# Patient Record
Sex: Female | Born: 1980
Health system: Southern US, Community
[De-identification: ages and names within clinical notes are randomized; demographics above are authoritative.]

## PROBLEM LIST (undated history)

## (undated) DIAGNOSIS — O24119 Pre-existing diabetes mellitus, type 2, in pregnancy, unspecified trimester: Secondary | ICD-10-CM

## (undated) DIAGNOSIS — O24419 Gestational diabetes mellitus in pregnancy, unspecified control: Secondary | ICD-10-CM

## (undated) DIAGNOSIS — A549 Gonococcal infection, unspecified: Secondary | ICD-10-CM

## (undated) DIAGNOSIS — IMO0002 Reserved for concepts with insufficient information to code with codable children: Secondary | ICD-10-CM

## (undated) HISTORY — DX: Reserved for concepts with insufficient information to code with codable children: IMO0002

## (undated) HISTORY — DX: Gestational diabetes mellitus in pregnancy, unspecified control: O24.419

## (undated) HISTORY — DX: Gonococcal infection, unspecified: A54.9

## (undated) HISTORY — DX: Pre-existing type 2 diabetes mellitus, in pregnancy, unspecified trimester: O24.119

## (undated) HISTORY — PX: NO PAST SURGERIES: SHX2092

---

## 2003-09-22 DIAGNOSIS — A549 Gonococcal infection, unspecified: Secondary | ICD-10-CM

## 2003-09-22 HISTORY — DX: Gonococcal infection, unspecified: A54.9

## 2003-11-21 ENCOUNTER — Inpatient Hospital Stay (HOSPITAL_COMMUNITY): Admission: AD | Admit: 2003-11-21 | Discharge: 2003-11-22 | Payer: Self-pay | Admitting: Obstetrics & Gynecology

## 2003-12-31 ENCOUNTER — Other Ambulatory Visit: Admission: RE | Admit: 2003-12-31 | Discharge: 2003-12-31 | Payer: Self-pay | Admitting: Obstetrics & Gynecology

## 2004-05-29 ENCOUNTER — Inpatient Hospital Stay (HOSPITAL_COMMUNITY): Admission: AD | Admit: 2004-05-29 | Discharge: 2004-05-29 | Payer: Self-pay | Admitting: *Deleted

## 2004-06-01 ENCOUNTER — Inpatient Hospital Stay (HOSPITAL_COMMUNITY): Admission: AD | Admit: 2004-06-01 | Discharge: 2004-06-01 | Payer: Self-pay | Admitting: Family Medicine

## 2004-06-02 ENCOUNTER — Ambulatory Visit: Payer: Self-pay | Admitting: Family Medicine

## 2004-06-05 ENCOUNTER — Ambulatory Visit: Payer: Self-pay | Admitting: Obstetrics and Gynecology

## 2004-06-08 ENCOUNTER — Ambulatory Visit: Payer: Self-pay | Admitting: Obstetrics and Gynecology

## 2004-06-08 ENCOUNTER — Inpatient Hospital Stay (HOSPITAL_COMMUNITY): Admission: AD | Admit: 2004-06-08 | Discharge: 2004-06-12 | Payer: Self-pay | Admitting: Family Medicine

## 2005-06-01 ENCOUNTER — Emergency Department (HOSPITAL_COMMUNITY): Admission: EM | Admit: 2005-06-01 | Discharge: 2005-06-01 | Payer: Self-pay | Admitting: Family Medicine

## 2006-07-01 ENCOUNTER — Ambulatory Visit: Payer: Self-pay | Admitting: Internal Medicine

## 2006-07-05 ENCOUNTER — Encounter: Admission: RE | Admit: 2006-07-05 | Discharge: 2006-07-05 | Payer: Self-pay | Admitting: Internal Medicine

## 2006-10-05 ENCOUNTER — Ambulatory Visit: Payer: Self-pay | Admitting: Internal Medicine

## 2006-11-12 ENCOUNTER — Inpatient Hospital Stay (HOSPITAL_COMMUNITY): Admission: AD | Admit: 2006-11-12 | Discharge: 2006-11-12 | Payer: Self-pay | Admitting: Family Medicine

## 2006-11-28 ENCOUNTER — Inpatient Hospital Stay (HOSPITAL_COMMUNITY): Admission: AD | Admit: 2006-11-28 | Discharge: 2006-11-28 | Payer: Self-pay | Admitting: Obstetrics and Gynecology

## 2007-01-05 ENCOUNTER — Ambulatory Visit (HOSPITAL_COMMUNITY): Admission: RE | Admit: 2007-01-05 | Discharge: 2007-01-05 | Payer: Self-pay | Admitting: Obstetrics & Gynecology

## 2007-05-02 ENCOUNTER — Ambulatory Visit: Payer: Self-pay | Admitting: *Deleted

## 2007-05-02 ENCOUNTER — Inpatient Hospital Stay (HOSPITAL_COMMUNITY): Admission: AD | Admit: 2007-05-02 | Discharge: 2007-05-03 | Payer: Self-pay | Admitting: Obstetrics & Gynecology

## 2009-02-16 ENCOUNTER — Inpatient Hospital Stay (HOSPITAL_COMMUNITY): Admission: AD | Admit: 2009-02-16 | Discharge: 2009-02-16 | Payer: Self-pay | Admitting: Obstetrics & Gynecology

## 2009-04-02 ENCOUNTER — Ambulatory Visit (HOSPITAL_COMMUNITY): Admission: RE | Admit: 2009-04-02 | Discharge: 2009-04-02 | Payer: Self-pay | Admitting: Obstetrics & Gynecology

## 2009-04-08 ENCOUNTER — Ambulatory Visit: Payer: Self-pay | Admitting: Obstetrics & Gynecology

## 2009-04-09 ENCOUNTER — Encounter: Payer: Self-pay | Admitting: Family

## 2009-04-09 LAB — CONVERTED CEMR LAB
Trich, Wet Prep: NONE SEEN
Yeast Wet Prep HPF POC: NONE SEEN

## 2009-04-19 ENCOUNTER — Ambulatory Visit (HOSPITAL_COMMUNITY): Admission: RE | Admit: 2009-04-19 | Discharge: 2009-04-19 | Payer: Self-pay | Admitting: Family Medicine

## 2009-04-22 ENCOUNTER — Ambulatory Visit: Payer: Self-pay | Admitting: Obstetrics & Gynecology

## 2009-04-23 ENCOUNTER — Encounter: Payer: Self-pay | Admitting: Obstetrics & Gynecology

## 2009-05-20 ENCOUNTER — Ambulatory Visit: Payer: Self-pay | Admitting: Obstetrics & Gynecology

## 2009-05-20 LAB — CONVERTED CEMR LAB
MCHC: 34.4 g/dL (ref 30.0–36.0)
RDW: 13.3 % (ref 11.5–15.5)

## 2009-05-28 ENCOUNTER — Encounter: Payer: Self-pay | Admitting: Obstetrics & Gynecology

## 2009-06-03 ENCOUNTER — Ambulatory Visit: Payer: Self-pay | Admitting: Obstetrics & Gynecology

## 2009-06-03 ENCOUNTER — Ambulatory Visit (HOSPITAL_COMMUNITY): Admission: RE | Admit: 2009-06-03 | Discharge: 2009-06-03 | Payer: Self-pay | Admitting: Obstetrics & Gynecology

## 2009-06-03 LAB — CONVERTED CEMR LAB
Platelets: 288 10*3/uL (ref 150–400)
RDW: 12.9 % (ref 11.5–15.5)

## 2009-06-10 ENCOUNTER — Ambulatory Visit: Payer: Self-pay | Admitting: Obstetrics & Gynecology

## 2009-06-10 ENCOUNTER — Encounter: Admission: RE | Admit: 2009-06-10 | Discharge: 2009-07-29 | Payer: Self-pay | Admitting: Obstetrics & Gynecology

## 2009-06-17 ENCOUNTER — Ambulatory Visit: Payer: Self-pay | Admitting: Obstetrics & Gynecology

## 2009-06-17 ENCOUNTER — Encounter: Payer: Self-pay | Admitting: Family

## 2009-06-24 ENCOUNTER — Ambulatory Visit: Payer: Self-pay | Admitting: Obstetrics & Gynecology

## 2009-07-01 ENCOUNTER — Ambulatory Visit (HOSPITAL_COMMUNITY): Admission: RE | Admit: 2009-07-01 | Discharge: 2009-07-01 | Payer: Self-pay | Admitting: Family Medicine

## 2009-07-01 ENCOUNTER — Ambulatory Visit: Payer: Self-pay | Admitting: Obstetrics & Gynecology

## 2009-07-04 ENCOUNTER — Ambulatory Visit: Payer: Self-pay | Admitting: Obstetrics & Gynecology

## 2009-07-08 ENCOUNTER — Ambulatory Visit: Payer: Self-pay | Admitting: Obstetrics & Gynecology

## 2009-07-08 ENCOUNTER — Ambulatory Visit (HOSPITAL_COMMUNITY): Admission: RE | Admit: 2009-07-08 | Discharge: 2009-07-08 | Payer: Self-pay | Admitting: Obstetrics & Gynecology

## 2009-07-11 ENCOUNTER — Ambulatory Visit: Payer: Self-pay | Admitting: Obstetrics & Gynecology

## 2009-07-15 ENCOUNTER — Ambulatory Visit: Payer: Self-pay | Admitting: Family Medicine

## 2009-07-18 ENCOUNTER — Ambulatory Visit: Payer: Self-pay | Admitting: Obstetrics & Gynecology

## 2009-07-22 ENCOUNTER — Inpatient Hospital Stay (HOSPITAL_COMMUNITY): Admission: AD | Admit: 2009-07-22 | Discharge: 2009-07-22 | Payer: Self-pay | Admitting: Obstetrics & Gynecology

## 2009-07-22 ENCOUNTER — Ambulatory Visit: Payer: Self-pay | Admitting: Family Medicine

## 2009-07-23 ENCOUNTER — Encounter: Payer: Self-pay | Admitting: Family Medicine

## 2009-07-25 ENCOUNTER — Ambulatory Visit: Payer: Self-pay | Admitting: Obstetrics & Gynecology

## 2009-07-29 ENCOUNTER — Ambulatory Visit: Payer: Self-pay | Admitting: Obstetrics & Gynecology

## 2009-07-29 ENCOUNTER — Encounter: Payer: Self-pay | Admitting: Obstetrics & Gynecology

## 2009-07-29 ENCOUNTER — Ambulatory Visit (HOSPITAL_COMMUNITY): Admission: RE | Admit: 2009-07-29 | Discharge: 2009-07-29 | Payer: Self-pay | Admitting: Obstetrics & Gynecology

## 2009-07-29 LAB — CONVERTED CEMR LAB: GC Probe Amp, Genital: NEGATIVE

## 2009-07-30 ENCOUNTER — Encounter: Payer: Self-pay | Admitting: Obstetrics & Gynecology

## 2009-07-30 LAB — CONVERTED CEMR LAB: Trich, Wet Prep: NONE SEEN

## 2009-08-01 ENCOUNTER — Inpatient Hospital Stay (HOSPITAL_COMMUNITY): Admission: RE | Admit: 2009-08-01 | Discharge: 2009-08-01 | Payer: Self-pay | Admitting: Obstetrics & Gynecology

## 2009-08-01 ENCOUNTER — Ambulatory Visit: Payer: Self-pay | Admitting: Obstetrics & Gynecology

## 2009-08-01 ENCOUNTER — Ambulatory Visit: Payer: Self-pay | Admitting: Advanced Practice Midwife

## 2009-08-05 ENCOUNTER — Ambulatory Visit (HOSPITAL_COMMUNITY): Admission: RE | Admit: 2009-08-05 | Discharge: 2009-08-05 | Payer: Self-pay | Admitting: Family Medicine

## 2009-08-05 ENCOUNTER — Ambulatory Visit: Payer: Self-pay | Admitting: Obstetrics & Gynecology

## 2009-08-08 ENCOUNTER — Ambulatory Visit: Payer: Self-pay | Admitting: Obstetrics & Gynecology

## 2009-08-12 ENCOUNTER — Inpatient Hospital Stay (HOSPITAL_COMMUNITY): Admission: AD | Admit: 2009-08-12 | Discharge: 2009-08-14 | Payer: Self-pay | Admitting: Obstetrics & Gynecology

## 2009-08-12 ENCOUNTER — Ambulatory Visit: Payer: Self-pay | Admitting: Obstetrics & Gynecology

## 2009-08-12 ENCOUNTER — Ambulatory Visit: Payer: Self-pay | Admitting: Family Medicine

## 2010-12-24 LAB — POCT URINALYSIS DIP (DEVICE)
Bilirubin Urine: NEGATIVE
Bilirubin Urine: NEGATIVE
Glucose, UA: NEGATIVE mg/dL
Hgb urine dipstick: NEGATIVE
Hgb urine dipstick: NEGATIVE
Ketones, ur: 15 mg/dL — AB
Ketones, ur: NEGATIVE mg/dL
Nitrite: NEGATIVE
Protein, ur: NEGATIVE mg/dL
Protein, ur: NEGATIVE mg/dL
Protein, ur: NEGATIVE mg/dL
Specific Gravity, Urine: 1.02 (ref 1.005–1.030)
Specific Gravity, Urine: 1.01 (ref 1.005–1.030)
Specific Gravity, Urine: 1.02 (ref 1.005–1.030)
Urobilinogen, UA: 1 mg/dL (ref 0.0–1.0)
pH: 5.5 (ref 5.0–8.0)
pH: 5 (ref 5.0–8.0)
pH: 5.5 (ref 5.0–8.0)
pH: 5.5 (ref 5.0–8.0)

## 2010-12-24 LAB — CBC
HCT: 37.6 % (ref 36.0–46.0)
Hemoglobin: 12.9 g/dL (ref 12.0–15.0)
MCV: 92.9 fL (ref 78.0–100.0)
Platelets: 217 10*3/uL (ref 150–400)
WBC: 9.3 10*3/uL (ref 4.0–10.5)

## 2010-12-24 LAB — WET PREP, GENITAL

## 2010-12-25 LAB — POCT URINALYSIS DIP (DEVICE)
Bilirubin Urine: NEGATIVE
Glucose, UA: NEGATIVE mg/dL
Hgb urine dipstick: NEGATIVE
Hgb urine dipstick: NEGATIVE
Ketones, ur: 15 mg/dL — AB
Nitrite: NEGATIVE
Nitrite: NEGATIVE
Nitrite: NEGATIVE
Protein, ur: NEGATIVE mg/dL
Protein, ur: NEGATIVE mg/dL
Protein, ur: NEGATIVE mg/dL
Protein, ur: NEGATIVE mg/dL
Specific Gravity, Urine: 1.025 (ref 1.005–1.030)
Specific Gravity, Urine: 1.02 (ref 1.005–1.030)
Urobilinogen, UA: 0.2 mg/dL (ref 0.0–1.0)
Urobilinogen, UA: 0.2 mg/dL (ref 0.0–1.0)
Urobilinogen, UA: 1 mg/dL (ref 0.0–1.0)
pH: 5 (ref 5.0–8.0)
pH: 6 (ref 5.0–8.0)
pH: 5.5 (ref 5.0–8.0)

## 2010-12-26 LAB — POCT URINALYSIS DIP (DEVICE)
Glucose, UA: 250 mg/dL — AB
Hgb urine dipstick: NEGATIVE
Nitrite: NEGATIVE
Nitrite: NEGATIVE
Nitrite: NEGATIVE
Protein, ur: NEGATIVE mg/dL
Protein, ur: NEGATIVE mg/dL
Urobilinogen, UA: 1 mg/dL (ref 0.0–1.0)
Urobilinogen, UA: 0.2 mg/dL (ref 0.0–1.0)
pH: 5 (ref 5.0–8.0)
pH: 6.5 (ref 5.0–8.0)

## 2010-12-26 LAB — GLUCOSE, CAPILLARY: Glucose-Capillary: 108 mg/dL — ABNORMAL HIGH (ref 70–99)

## 2010-12-27 LAB — POCT URINALYSIS DIP (DEVICE)
Glucose, UA: NEGATIVE mg/dL
Hgb urine dipstick: NEGATIVE
Protein, ur: NEGATIVE mg/dL
Urobilinogen, UA: 0.2 mg/dL (ref 0.0–1.0)
Urobilinogen, UA: 0.2 mg/dL (ref 0.0–1.0)

## 2010-12-28 LAB — POCT URINALYSIS DIP (DEVICE)
Glucose, UA: NEGATIVE mg/dL
Hgb urine dipstick: NEGATIVE
Protein, ur: 30 mg/dL — AB
Specific Gravity, Urine: >=1.03 (ref 1.005–1.030)
Urobilinogen, UA: 0.2 mg/dL (ref 0.0–1.0)

## 2010-12-30 LAB — GC/CHLAMYDIA PROBE AMP, GENITAL: GC Probe Amp, Genital: NEGATIVE

## 2010-12-30 LAB — URINE CULTURE
Colony Count: NO GROWTH
Culture: NO GROWTH

## 2010-12-30 LAB — URINALYSIS, ROUTINE W REFLEX MICROSCOPIC
Glucose, UA: NEGATIVE mg/dL
Hgb urine dipstick: NEGATIVE
pH: 5.5 (ref 5.0–8.0)

## 2010-12-30 LAB — URINE MICROSCOPIC-ADD ON

## 2011-02-03 NOTE — Discharge Summary (Signed)
Kimberly Wilkerson, Kimberly Wilkerson        ACCOUNT NO.:  [id number]   MEDICAL RECORD NO.:  [id number]          PATIENT TYPE:  INP   LOCATION:  9309                          FACILITY:  [facility]   PHYSICIAN:  Phil D. Okey Dupre, M.D.     DATE OF BIRTH:  03/30/1981   DATE OF ADMISSION:  05/02/2007  DATE OF DISCHARGE:  05/03/2007                               DISCHARGE SUMMARY   FINAL DIAGNOSIS:  Spontaneous vaginal delivery at 37 weeks with  intrauterine fetal demise.   PERTINENT LABORATORY DATA:  The patient was B-positive, antibody  negative, RPR negative, Rubella immune, HPS antigen negative, GBS  unknown, and HIV nonreactive.   REASON FOR ADMISSION:  Patient is a 30 year old, G2, P2-0-0-1, at 30 and  2 that [facility] with decreased fetal  movement and no fetal cardiac activity seen on ultrasound.   HOSPITAL COURSE:  Patient was admitted to the Labor and Delivery floor  and delivered a term intrauterine fetal demise at 2:14 a.m. via  spontaneous vaginal delivery over an intact perineum with an epidural  anesthesia.  She had an acute artificial rupture of her membranes just  before delivery, which showed thick meconium and dark fluid.  The baby  was left occiput-transverse upon presentation with no nuchal cord  present, but a very short umbilical cord noted.  The cord was clamped  and cut, and baby was handed to mom.  The fetus had a moderate peeling  of the skin, but no other obvious abnormalities; the fetus was  nonviable.  The placenta delivered immediately after delivery of the  fetus and appeared to have been separated already, but no obvious blood  clots were noted.  There were no perineal lacerations, estimated blood  loss was less than 300, patient was stable, and the infant was  nonviable.  Patient denied wanting an autopsy at the time.  Post  delivery, patient was transferred to the third floor and was seen by  Social Work to set up the funeral plans.  Again,  patient denied wanting  an autopsy.  Labs were collected and are still pending for a TORCH  panel, lupus, anticoagulant panel, and also cardiolipin antibodies.  Patient's pain has been tolerated with both Percocet and Motrin, and  patient decided that she wanted to go home so she is being discharged on  postpartum day zero.   DISCHARGE DISPOSITION AND CONDITION:  The patient is discharged home in  good condition.  Followup care will be at the [facility]  in four to six weeks.   DISCHARGE MEDICATIONS:  1. Percocet 5/325.  Patient is to take one to two tablets p.o. every 4      to 6 hours as needed for pain.  She was given      15 tablets.  2. Colace 100 mg one tablet p.o. b.i.d. as needed for constipation.  3. Motrin 600 mg p.o. q.4-6h as needed for pain.      Marisue Ivan, MD      [name]. Okey Dupre, M.D.  Electronically Signed    KL/MEDQ  D:  05/03/2007  T:  05/04/2007  Job:  [id number]

## 2011-02-06 NOTE — Discharge Summary (Signed)
[name], Kimberly Wilkerson        ACCOUNT NO.:  [id number]   MEDICAL RECORD NO.:  [id number]          PATIENT TYPE:  INP   LOCATION:  9145                          FACILITY:  [facility]   PHYSICIAN:  Tanya S. Shawnie Pons, M.D.   DATE OF BIRTH:  [birth year]   DATE OF ADMISSION:  06/08/2004  DATE OF DISCHARGE:                                 DISCHARGE SUMMARY   DISCHARGE DIAGNOSES:  1.  Term pregnancy, delivered.  2.  Chorioamnionitis.   HISTORY OF PRESENTATION:  This is a 30 year old gravida 1 para 0 who  presented at 40+ weeks for induction of labor.  Her pregnancy to this point  was complicated only by an episode of gonorrhea which was treated, with a  negative test following.   PRENATAL LABORATORY DATA:  Blood type was B positive, antibody negative.  Hemoglobin 13.4, platelets 288.  Rubella immune.  Hepatitis B negative.  Syphilis negative.  HIV negative.  Gonorrhea positive, then negative after  treatment.  Chlamydia negative.  Group B strep negative.  Her 1-hour Glucola  was 119.   HOSPITAL COURSE:  #1 - TERM PREGNANCY FOR INDUCTION OF LABOR.  She was  admitted to labor and delivery, was initially started on Pitocin, however  did not progress well.  Pitocin was discontinued and Cervidil was placed.  Following Cervidil she was restarted on Pitocin and progressively dilated.  At approximately 7 cm dilation she developed a temperature to 100.4.  She  was started on IV ampicillin and gentamycin and fever was treated  symptomatically with Tylenol.  At the time of delivery her temperature was  102.  Her antibiotics were continued for 48 hours postpartum though she  remained afebrile after delivery.  IV antibiotics were discontinued without  any recurrence in maternal fevers.   #2 - DELIVERY SYNOPSIS.  A 30 year old gravida 1 now para 1 delivered a  viable female infant with Apgars of 9 and 9 under epidural anesthesia.  Infant  was noted to be tachycardic and have a low-grade fever and was taken  to the  health nursery for evaluation, was returned to mother several hours later  with no problems following that.  She had spontaneous delivery of an intact  placenta.  Estimated blood loss was less than 300 mL.  She had a third  degree perineal laceration which was repaired with 2-0 and 3-0 chromic with  good hemostasis.  Time of delivery was 2319 on June 09, 2004.  Attending physician was Dr. Okey Dupre.   LABORATORY AND STUDIES:  Urine culture showed no growth.  Postpartum  hemoglobin was 11.4 with hematocrit of 32.5.  RPR nonreactive.   DISCHARGE INSTRUCTIONS:  1.  She is discharged home.  2.  She is to follow up at [facility] in 6 weeks.  3.  She is to call or return for any recurrence in fever.  4.  She is to eat a healthy diet.  5.  She is to be active as she is able.  6.  She is not to have sexual intercourse or place any tampons in the vagina      for 6 weeks.  DISCHARGE MEDICATIONS:  1.  Ibuprofen 600 mg one tablet q.6h. as needed for pain.  2.  Ortho Micronor one tablet at the same time daily each day, start on      June 22, 2004.  3.  Prenatal vitamins one tablet daily while breastfeeding.      Hadley Pen  D:  06/12/2004  T:  06/12/2004  Job:  [id number]

## 2011-07-06 LAB — LUPUS ANTICOAGULANT PANEL
DRVVT: 38.8 (ref 36.1–47.0)
Lupus Anticoagulant: NOT DETECTED
PTT Lupus Anticoagulant: 46 (ref 36.3–48.8)

## 2011-07-06 LAB — CBC
HCT: 35.3 — ABNORMAL LOW
Hemoglobin: 12.6
MCHC: 35.8
MCV: 90.2
Platelets: 291
RBC: 3.92
RDW: 13.9
WBC: 9.1

## 2011-07-06 LAB — TORCH-IGM(TOXO/ RUB/ CMV/ HSV) W TITER
CMV IgM: 0.3 IV
Rubella IgM Index: 0.36 IV
Toxoplasma IgM: 0.46 IV

## 2011-07-06 LAB — TORCH TITERS-IGG(TOXO/ RUB/ CMV/ HSV)
CMV IgG: 10.5 IV
HSV I/II IgG: >22.4 IV
Rubella IgG Scr: >400 [IU]/mL
Toxoplasma IgG Antibody (EIA): <5 [IU]/mL

## 2011-07-06 LAB — CARDIOLIPIN ANTIBODIES, IGM+IGG
Anticardiolipin IgG: <7 — ABNORMAL LOW
Anticardiolipin IgM: <7 — ABNORMAL LOW

## 2011-07-06 LAB — RPR: RPR Ser Ql: NONREACTIVE

## 2011-09-22 NOTE — L&D Delivery Note (Signed)
Delivery Note We were called to the room after patient had spontaneously delivered the baby and placenta en caul. At 11:23 PM a non-viable girl was delivered via Vaginal, Spontaneous Delivery.  Placenta status: Intact, Spontaneous.  Cord:  with the following complications:none  Anesthesia: Epidural  Episiotomy: none Lacerations: none  Blood Loss (mL): 200  Mom to postpartum.  Governor Specking 09/09/2012, 11:37 PM  I have seen and examined this patient and I agree with the above. Plans being made with social work for burial. Support offered. Clelia Croft, Zaharah Amir 11:50 PM 09/09/2012

## 2012-05-11 ENCOUNTER — Other Ambulatory Visit (HOSPITAL_COMMUNITY): Payer: Self-pay | Admitting: Nurse Practitioner

## 2012-05-11 DIAGNOSIS — Z0489 Encounter for examination and observation for other specified reasons: Secondary | ICD-10-CM

## 2012-05-11 LAB — OB RESULTS CONSOLE GC/CHLAMYDIA: Chlamydia: NEGATIVE

## 2012-05-11 LAB — GLUCOSE TOLERANCE, 1 HOUR: Glucose, GTT - 1 Hour: 164 mg/dL (ref ?–200)

## 2012-05-11 LAB — OB RESULTS CONSOLE PLATELET COUNT: Platelets: 242 10*3/uL

## 2012-05-11 LAB — OB RESULTS CONSOLE ABO/RH: RH Type: POSITIVE

## 2012-05-11 LAB — CULTURE, OB URINE: Urine Culture, OB: NEGATIVE

## 2012-05-11 LAB — OB RESULTS CONSOLE HIV ANTIBODY (ROUTINE TESTING): HIV: NONREACTIVE

## 2012-05-13 LAB — GLUCOSE TOLERANCE, 3 HOURS: Glucose, 1 Hour GTT: 249

## 2012-05-16 ENCOUNTER — Encounter: Payer: Self-pay | Attending: Obstetrics and Gynecology | Admitting: Dietician

## 2012-05-16 ENCOUNTER — Other Ambulatory Visit: Payer: Self-pay

## 2012-05-16 DIAGNOSIS — Z713 Dietary counseling and surveillance: Secondary | ICD-10-CM | POA: Insufficient documentation

## 2012-05-16 DIAGNOSIS — O99810 Abnormal glucose complicating pregnancy: Secondary | ICD-10-CM | POA: Insufficient documentation

## 2012-05-16 DIAGNOSIS — O09299 Supervision of pregnancy with other poor reproductive or obstetric history, unspecified trimester: Secondary | ICD-10-CM

## 2012-05-16 NOTE — Progress Notes (Unsigned)
Diabetes Education: Seen today for blood glucose monitoring instructions.  Reports a history of GDM with her last pregnancy approximately 3 years ago. EDD is 11/13/2012.  Provided a education, orientation and a True Track Meter kit [id number] Exp: 2014/04/20.  1 box of strips Lot: [id number] Exp:2014/07/21, and one box of lancets Lot: [id number] Exp: 2016/02/22.  On return demonstration, blood glucose 2 hours following breakfast was 110.  Instructed to test fasting and two hour post meal glucose levels and record.  Instructed to bring blood glucose log and meter to each clinic appointment.  Assisted by Ardeen Garland, Spanish Interpreter.  Kimberly Tanelle Lanzo, RN, RD, CDE

## 2012-05-16 NOTE — Progress Notes (Unsigned)
Nutrition Note: 1st consult visit Pt seen for GDM diet education. Pt has gained 2.2# @ [redacted]w[redacted]d, which is wnl. Pt given verbal & written GDM education with translator help (Juli Sowell).  Pt reports eating 3 meals/ d & works 7:30am-3pm & will eat lunch there but no snacks. Pt reports taking PNV. No N/V reported & no food allergies.  Pt drinks water, a little juice, 1 c of milk/ d & has stopped drinking soda recently.  Disc wt gain goals of 11-20#. Pt agrees to follow GDM diet including 3 meals & 3 snacks with proper CHO/ protein combination. Pt does receive WIC services. F/u 2-4 wks  Blondell Reveal, MS, RD, LDN

## 2012-05-18 DIAGNOSIS — O09299 Supervision of pregnancy with other poor reproductive or obstetric history, unspecified trimester: Secondary | ICD-10-CM | POA: Insufficient documentation

## 2012-05-26 ENCOUNTER — Encounter: Payer: Self-pay | Admitting: Obstetrics & Gynecology

## 2012-05-26 ENCOUNTER — Ambulatory Visit (INDEPENDENT_AMBULATORY_CARE_PROVIDER_SITE_OTHER): Payer: Self-pay | Admitting: Obstetrics & Gynecology

## 2012-05-26 DIAGNOSIS — O99810 Abnormal glucose complicating pregnancy: Secondary | ICD-10-CM

## 2012-05-26 LAB — POCT URINALYSIS DIP (DEVICE)
Bilirubin Urine: NEGATIVE
Glucose, UA: 100 mg/dL — AB
Hgb urine dipstick: NEGATIVE
Ketones, ur: 15 mg/dL — AB
Specific Gravity, Urine: 1.02 (ref 1.005–1.030)
Urobilinogen, UA: 0.2 mg/dL (ref 0.0–1.0)

## 2012-05-26 MED ORDER — GLYBURIDE 2.5 MG PO TABS: 2.5000 mg | ORAL_TABLET | Freq: Two times a day (BID) | ORAL | Status: DC

## 2012-05-26 NOTE — Progress Notes (Signed)
P-72 

## 2012-05-26 NOTE — Patient Instructions (Signed)
Diabetes mellitus gestacional (Gestational Diabetes Mellitus) La diabetes mellitus gestacional se produce slo durante el embarazo. Aparece cuando el organismo no puede controlar adecuadamente la glucosa (azcar) que aumenta en la sangre despus de comer. Durante el embarazo, se produce una resistencia a la insulina (sensibilidad reducida a la insulina) debido a la liberacin de hormonas por parte de la placenta. Generalmente, el pncreas de una mujer embarazada produce la cantidad suficiente de insulina para vencer esa resistencia. Sin embargo, en la diabetes gestacional, hay insulina pero no cumple su funcin adecuadamente. Si la resistencia es lo suficientemente grave como para que el pncreas no produzca la cantidad de insulina suficiente, la glucosa extra se acumula en la sangre.  QUINES TIENEN RIESGO DE DESARROLLAR DIABETES GESTACIONAL?  Las mujeres con historia de diabetes en la familia.   Las mujeres de ms de 25 aos.   Las que presentan sobrepeso.   Las mujeres que pertenecen a ciertos grupos tnicos (latinas, afroamericanas, norteamericanas nativas, asiticas y las originarias de las islas del [place].  QUE PUEDE OCURRIRLE AL BEB? Si el nivel de glucosa en sangre de la madre es demasiado elevado mientras este embarazada, el nivel extra de azcar pasar por el cordn umbilical hacia el beb. Algunos de los problemas del beb pueden ser:  Beb demasiado grande: si el nio recibe demasiada azcar, puede aumentar mucho de peso. Esto puede hacer que sea demasiado grande para nacer por parto normal (vaginal) por lo que ser necesario realizar una cesrea.   Bajo nivel de glucosa (hipoglucemia): el beb produce insulina extra en respuesta a la excesiva cantidad de azcar que obtiene de la madre. Cuando el beb nace y ya no necesita insulina extra, su nivel de azcar en sangre puede disminuir.   Ictericia (coloracin amarillenta de la piel y los ojos): esto es bastante frecuente en los  bebs. La causa es la acumulacin de una sustancia qumica denominada bilirrubina. No siempre es un trastorno grave, pero se observa con frecuencia en los bebs cuyas madres sufren diabetes gestacional.  RIESGOS PARA LA MADRE Las mujeres que han sufrido diabetes gestacional pueden tener ms riesgos para algunos problemas como:  Preeclampsia o toxemia, incluyendo problemas con hipertensin arterial. La presin arterial y los niveles de protenas en la orina deben controlarse con frecuencia.   Infecciones   Parto por cesrea.   Aparicin de diabetes tipo 2 en una etapa posterior de la vida. Alrededor del 30% al 50% sufrir diabetes posteriormente, especialmente las que son obesas.  DIAGNSTICO Las hormonas que causan resistencia a la insulina tienen su mayor nivel alrededor de las 24 a 28 semanas del embarazo. Si se experimentan sntomas, stos son similares a los sntomas que normalmente aparecen durante el embarazo.  La diabetes mellitus gestacional generalmente se diagnostica por medio de un mtodo en dos partes: 1. Despus de la 24 a 28 semanas de embarazo, la mujer debe beber una solucin que contiene glucosa y realizar un anlisis de sangre. Si el nivel de glucosa es elevado, la realizarn un segundo anlisis.  2. La prueba oral de tolerancia a la glucosa, que dura aproximadamente tres horas. Despus de realizar ayuno durante la noche, se controla nivel de glucosa en sangre. La mujer bebe una solucin que contiene glucosa y le realizan anlisis de glucosa en sangre cada hora.  Si la mujer tiene factores de riesgos para la diabetes mellitus gestacional, el mdico podr indicar el anlisis antes de las 24 semanas de embarazo. TRATAMIENTO El tratamiento est dirigido a mantener la glucosa en   sangre de la madre en un nivel normal y puede incluir:  La planificacin de los alimentos.   Recibir insulina u otro medicamento para controlar el nivel de glucosa en sangre.   La prctica de ejercicios.    Llevar un registro diario de los alimentos que consume.   Control y registro de los niveles de glucosa en sangre.   Control de los niveles de cetona en la orina, aunque esto ya no se considera necesario en la mayora de los embarazos.  INSTRUCCIONES PARA EL CUIDADO DOMICILIARIO Mientras est embarazada:  Siga los consejos de su mdico relacionados con los controles prenatales, la planificacin de la comida, la actividad fsica, los medicamentos, vitaminas, los anlisis de sangre y otras pruebas y las actividades fsicas.   Lleve un registro de las comidas, las pruebas de glucosa en sangre y la cantidad de insulina que recibe (si corresponde). Muestre todo al profesional en cada consulta mdica prenatal.   Si sufre diabetes mellitus gestacional, podr tener problemas de hipoglucemia (nivel bajo de glucosa en sangre). Podr sospechar este problema si se siente repentinamente mareada, tiene temblores y/o se siente dbil. Si cree que esto le est ocurriendo, y tiene un medidor de glucosa, mida su nivel de glucosa en sangre. Siga los consejos de su mdico sobre el modo y el momento de tratar su nivel de glucosa en sangre. Generalmente se sigue la regla 15:15 Consuma 15 g de hidratos de carbono, espere 15 minutos y vuelva controlar el nivel de glucosa en sangre.. Ejemplos de 15 g de hidratos de carbono son:   1 taza de leche descremada.    taza de jugo.   3-4 tabletas de glucosa.   5-6 caramelos duros.   1 caja pequea de pasas de uva.    taza de gaseosa comn.   Mantenga una buena higiene para evitar infecciones.   No fume.  SOLICITE ATENCIN MDICA SI:  Observa prdida vaginal con o sin picazn.   Se siente ms dbil o cansada que lo habitual.   Transpira mucho.   Tiene un aumento de peso repentino, 2,5 kg o ms en una semana.   Pierde peso, 1.5 kg o ms en una semana.   Su nivel de glucosa en sangre es elevado, necesita instrucciones.  SOLICITE ATENCIN MDICA DE  INMEDIATO SI:  Sufre una cefalea intensa.   Se marea o pierde el conocimiento   Presenta nuseas o vmitos.   Se siente desorientada confundida.   Sufre convulsiones.   Tiene problemas de visin.   Siente dolor en el estmago.   Presenta una hemorragia vaginal abundante.   Tiene contracciones uterinas.   Tiene una prdida importante de lquido por la vagina  DESPUS QUE NACE EL BEB:  Concurra a todos los controles de seguimiento y realice los anlisis de sangre segn las indicaciones de su mdico.   Mantenga un estilo de vida saludable para evitar la diabetes en el futuro. Aqu se incluye:   Siga el plan de alimentacin saludable.   Controle su peso.   Practique actividad fsica y descanse lo necesario.   No fume.   Amamante a su beb mientras pueda. Esto disminuir la probabilidad de que usted y su beb sufran diabetes posteriormente.  Para ms informacin acerca de la diabetes, visite la pgina web de la American Diabetes Association: www.americandiabetesassociation.org. Para ms informacin acerca de la diabetes gestacional cite la pgina web del American Congress of Obstetricians and Gynecologists en: www.acog.org. Document Released: 06/17/2005 Document Revised: 08/27/2011 [employer] Patient Information 2012   [employer]. 

## 2012-05-26 NOTE — Progress Notes (Signed)
CBG:  Fasting--120, 129, 129, 139, 119, 147, 166, 136  2Hr After Breakfast-- 110, 237, 132 2hr after lunch-- 129, 100, 101, 146,137,138, 143, 119, 131, 105 2Hr after dinner- 157, 164, 160, 102  Start Glyburide 2.5mg  BID 2nd pregnancy IUFD no Autopsy performed, No DM BPP 28-30 weeks Anatomy US Next week Quad-Screen Pending from Health Department.

## 2012-05-31 ENCOUNTER — Ambulatory Visit (HOSPITAL_COMMUNITY): Admission: RE | Admit: 2012-05-31 | Payer: Self-pay | Source: Ambulatory Visit

## 2012-06-06 ENCOUNTER — Encounter: Payer: Self-pay | Admitting: *Deleted

## 2012-06-06 ENCOUNTER — Encounter: Payer: Self-pay | Admitting: Obstetrics and Gynecology

## 2012-06-06 ENCOUNTER — Ambulatory Visit (INDEPENDENT_AMBULATORY_CARE_PROVIDER_SITE_OTHER): Payer: Self-pay | Admitting: Obstetrics and Gynecology

## 2012-06-06 VITALS — BP 106/64 | Temp 97.6°F | Wt 160.0 lb

## 2012-06-06 DIAGNOSIS — O24919 Unspecified diabetes mellitus in pregnancy, unspecified trimester: Secondary | ICD-10-CM

## 2012-06-06 DIAGNOSIS — O24119 Pre-existing diabetes mellitus, type 2, in pregnancy, unspecified trimester: Secondary | ICD-10-CM

## 2012-06-06 DIAGNOSIS — O0990 Supervision of high risk pregnancy, unspecified, unspecified trimester: Secondary | ICD-10-CM

## 2012-06-06 HISTORY — DX: Pre-existing type 2 diabetes mellitus, in pregnancy, unspecified trimester: O24.119

## 2012-06-06 LAB — COMPREHENSIVE METABOLIC PANEL
Albumin: 3.9 g/dL (ref 3.5–5.2)
CO2: 19 mEq/L (ref 19–32)
Calcium: 9.5 mg/dL (ref 8.4–10.5)
Chloride: 107 mEq/L (ref 96–112)
Glucose, Bld: 126 mg/dL — ABNORMAL HIGH (ref 70–99)
Potassium: 3.6 mEq/L (ref 3.5–5.3)
Sodium: 136 mEq/L (ref 135–145)
Total Protein: 6.6 g/dL (ref 6.0–8.3)

## 2012-06-06 LAB — CBC
Hemoglobin: 12.6 g/dL (ref 12.0–15.0)
MCH: 31.2 pg (ref 26.0–34.0)
RBC: 4.04 MIL/uL (ref 3.87–5.11)

## 2012-06-06 LAB — POCT URINALYSIS DIP (DEVICE)
Hgb urine dipstick: NEGATIVE
Nitrite: NEGATIVE
Urobilinogen, UA: 0.2 mg/dL (ref 0.0–1.0)
pH: 6 (ref 5.0–8.0)

## 2012-06-06 MED ORDER — GLYBURIDE 2.5 MG PO TABS: 2.5000 mg | ORAL_TABLET | Freq: Two times a day (BID) | ORAL | Status: DC

## 2012-06-06 NOTE — Progress Notes (Signed)
CBG f 108-120 (3/3 abnormal) 2hr p Bk 73-160 (1/4 abnormal) 2hr p L 110-180 (4/6 abnormal) 2hr p D 97-172 (2/4 abnormal) Patient not checking CBG routinely. Reviewed importance of adhering to diet and monitoring CBGs frequently. Risk of fetal death explained. Explained the importance of consuming 3 meals a days with snacks in between. Patient often skips meals. Will order anatomy ultrasound, will order baseline PIH labs, with HgA1c. RTC in 1 weeks.

## 2012-06-06 NOTE — Progress Notes (Signed)
U/S scheduled for 06/08/12 at 3 pm

## 2012-06-06 NOTE — Progress Notes (Signed)
Pulse: 78

## 2012-06-06 NOTE — Addendum Note (Signed)
Addended by: Jill Side on: 06/06/2012 11:42 AM   Modules accepted: Orders

## 2012-06-08 ENCOUNTER — Encounter: Payer: Self-pay | Admitting: Obstetrics and Gynecology

## 2012-06-08 ENCOUNTER — Ambulatory Visit (HOSPITAL_COMMUNITY)
Admission: RE | Admit: 2012-06-08 | Discharge: 2012-06-08 | Disposition: A | Discharge status: Home / Self Care | Payer: Self-pay | Source: Ambulatory Visit | Attending: Obstetrics and Gynecology | Admitting: Obstetrics and Gynecology

## 2012-06-08 DIAGNOSIS — Z1389 Encounter for screening for other disorder: Secondary | ICD-10-CM | POA: Insufficient documentation

## 2012-06-08 DIAGNOSIS — O358XX0 Maternal care for other (suspected) fetal abnormality and damage, not applicable or unspecified: Secondary | ICD-10-CM | POA: Insufficient documentation

## 2012-06-08 DIAGNOSIS — E669 Obesity, unspecified: Secondary | ICD-10-CM | POA: Insufficient documentation

## 2012-06-08 DIAGNOSIS — O0990 Supervision of high risk pregnancy, unspecified, unspecified trimester: Secondary | ICD-10-CM

## 2012-06-08 DIAGNOSIS — Z363 Encounter for antenatal screening for malformations: Secondary | ICD-10-CM | POA: Insufficient documentation

## 2012-06-08 DIAGNOSIS — O99810 Abnormal glucose complicating pregnancy: Secondary | ICD-10-CM | POA: Insufficient documentation

## 2012-06-08 LAB — PROTEIN, URINE, 24 HOUR
Protein, 24H Urine: 84 mg/d (ref 50–100)
Protein, Urine: 4 mg/dL

## 2012-06-09 ENCOUNTER — Encounter: Payer: Self-pay | Admitting: Obstetrics and Gynecology

## 2012-06-13 ENCOUNTER — Ambulatory Visit (INDEPENDENT_AMBULATORY_CARE_PROVIDER_SITE_OTHER): Payer: Self-pay | Admitting: Family Medicine

## 2012-06-13 VITALS — BP 106/67 | Temp 97.0°F | Wt 161.2 lb

## 2012-06-13 DIAGNOSIS — Z23 Encounter for immunization: Secondary | ICD-10-CM

## 2012-06-13 DIAGNOSIS — O24119 Pre-existing diabetes mellitus, type 2, in pregnancy, unspecified trimester: Secondary | ICD-10-CM

## 2012-06-13 DIAGNOSIS — O24919 Unspecified diabetes mellitus in pregnancy, unspecified trimester: Secondary | ICD-10-CM

## 2012-06-13 LAB — POCT URINALYSIS DIP (DEVICE)
Bilirubin Urine: NEGATIVE
Glucose, UA: NEGATIVE mg/dL
Hgb urine dipstick: NEGATIVE
Specific Gravity, Urine: 1.025 (ref 1.005–1.030)
pH: 5.5 (ref 5.0–8.0)

## 2012-06-13 MED ORDER — GLYBURIDE 2.5 MG PO TABS: ORAL_TABLET | ORAL | Status: DC

## 2012-06-13 MED ORDER — INFLUENZA VIRUS VACC SPLIT PF IM SUSP
0.5000 mL | Freq: Once | INTRAMUSCULAR | Status: AC
  Administered 2012-06-13: 0.5 mL via INTRAMUSCULAR

## 2012-06-13 MED ORDER — PRENATAL 27-0.8 MG PO TABS: 1.0000 | ORAL_TABLET | Freq: Every day | ORAL | Status: DC

## 2012-06-13 NOTE — Progress Notes (Signed)
Pulse: 62 Pt would like flu vaccine, consent signed.

## 2012-06-13 NOTE — Patient Instructions (Signed)
Diabetes mellitus gestacional (Gestational Diabetes Mellitus) La diabetes mellitus gestacional se produce slo durante el embarazo. Aparece cuando el organismo no puede controlar adecuadamente la glucosa (azcar) que aumenta en la sangre despus de comer. Durante el embarazo, se produce una resistencia a la insulina (sensibilidad reducida a la insulina) debido a la liberacin de hormonas por parte de la placenta. Generalmente, el pncreas de una mujer embarazada produce la cantidad suficiente de insulina para vencer esa resistencia. Sin embargo, en la diabetes gestacional, hay insulina pero no cumple su funcin adecuadamente. Si la resistencia es lo suficientemente grave como para que el pncreas no produzca la cantidad de insulina suficiente, la glucosa extra se acumula en la sangre.  QUINES TIENEN RIESGO DE DESARROLLAR DIABETES GESTACIONAL?  Las mujeres con historia de diabetes en la familia.   Las mujeres de ms de 25 aos.   Las que presentan sobrepeso.   Las mujeres que pertenecen a ciertos grupos tnicos (latinas, afroamericanas, norteamericanas nativas, asiticas y las originarias de las islas del [place].  QUE PUEDE OCURRIRLE AL BEB? Si el nivel de glucosa en sangre de la madre es demasiado elevado mientras este embarazada, el nivel extra de azcar pasar por el cordn umbilical hacia el beb. Algunos de los problemas del beb pueden ser:  Beb demasiado grande: si el nio recibe demasiada azcar, puede aumentar mucho de peso. Esto puede hacer que sea demasiado grande para nacer por parto normal (vaginal) por lo que ser necesario realizar una cesrea.   Bajo nivel de glucosa (hipoglucemia): el beb produce insulina extra en respuesta a la excesiva cantidad de azcar que obtiene de la madre. Cuando el beb nace y ya no necesita insulina extra, su nivel de azcar en sangre puede disminuir.   Ictericia (coloracin amarillenta de la piel y los ojos): esto es bastante frecuente en los  bebs. La causa es la acumulacin de una sustancia qumica denominada bilirrubina. No siempre es un trastorno grave, pero se observa con frecuencia en los bebs cuyas madres sufren diabetes gestacional.  RIESGOS PARA LA MADRE Las mujeres que han sufrido diabetes gestacional pueden tener ms riesgos para algunos problemas como:  Preeclampsia o toxemia, incluyendo problemas con hipertensin arterial. La presin arterial y los niveles de protenas en la orina deben controlarse con frecuencia.   Infecciones   Parto por cesrea.   Aparicin de diabetes tipo 2 en una etapa posterior de la vida. Alrededor del 30% al 50% sufrir diabetes posteriormente, especialmente las que son obesas.  DIAGNSTICO Las hormonas que causan resistencia a la insulina tienen su mayor nivel alrededor de las 24 a 28 semanas del embarazo. Si se experimentan sntomas, stos son similares a los sntomas que normalmente aparecen durante el embarazo.  La diabetes mellitus gestacional generalmente se diagnostica por medio de un mtodo en dos partes: 1. Despus de la 24 a 28 semanas de embarazo, la mujer debe beber una solucin que contiene glucosa y realizar un anlisis de sangre. Si el nivel de glucosa es elevado, la realizarn un segundo anlisis.  2. La prueba oral de tolerancia a la glucosa, que dura aproximadamente tres horas. Despus de realizar ayuno durante la noche, se controla nivel de glucosa en sangre. La mujer bebe una solucin que contiene glucosa y le realizan anlisis de glucosa en sangre cada hora.  Si la mujer tiene factores de riesgos para la diabetes mellitus gestacional, el mdico podr indicar el anlisis antes de las 24 semanas de embarazo. TRATAMIENTO El tratamiento est dirigido a mantener la glucosa en   sangre de la madre en un nivel normal y puede incluir:  La planificacin de los alimentos.   Recibir insulina u otro medicamento para controlar el nivel de glucosa en sangre.   La prctica de ejercicios.    Llevar un registro diario de los alimentos que consume.   Control y registro de los niveles de glucosa en sangre.   Control de los niveles de cetona en la orina, aunque esto ya no se considera necesario en la mayora de los embarazos.  INSTRUCCIONES PARA EL CUIDADO DOMICILIARIO Mientras est embarazada:  Siga los consejos de su mdico relacionados con los controles prenatales, la planificacin de la comida, la actividad fsica, los medicamentos, vitaminas, los anlisis de sangre y otras pruebas y las actividades fsicas.   Lleve un registro de las comidas, las pruebas de glucosa en sangre y la cantidad de insulina que recibe (si corresponde). Muestre todo al profesional en cada consulta mdica prenatal.   Si sufre diabetes mellitus gestacional, podr tener problemas de hipoglucemia (nivel bajo de glucosa en sangre). Podr sospechar este problema si se siente repentinamente mareada, tiene temblores y/o se siente dbil. Si cree que esto le est ocurriendo, y tiene un medidor de glucosa, mida su nivel de glucosa en sangre. Siga los consejos de su mdico sobre el modo y el momento de tratar su nivel de glucosa en sangre. Generalmente se sigue la regla 15:15 Consuma 15 g de hidratos de carbono, espere 15 minutos y vuelva controlar el nivel de glucosa en sangre.. Ejemplos de 15 g de hidratos de carbono son:   1 taza de leche descremada.    taza de jugo.   3-4 tabletas de glucosa.   5-6 caramelos duros.   1 caja pequea de pasas de uva.    taza de gaseosa comn.   Mantenga una buena higiene para evitar infecciones.   No fume.  SOLICITE ATENCIN MDICA SI:  Observa prdida vaginal con o sin picazn.   Se siente ms dbil o cansada que lo habitual.   Transpira mucho.   Tiene un aumento de peso repentino, 2,5 kg o ms en una semana.   Pierde peso, 1.5 kg o ms en una semana.   Su nivel de glucosa en sangre es elevado, necesita instrucciones.  SOLICITE ATENCIN MDICA DE  INMEDIATO SI:  Sufre una cefalea intensa.   Se marea o pierde el conocimiento   Presenta nuseas o vmitos.   Se siente desorientada confundida.   Sufre convulsiones.   Tiene problemas de visin.   Siente dolor en el estmago.   Presenta una hemorragia vaginal abundante.   Tiene contracciones uterinas.   Tiene una prdida importante de lquido por la vagina  DESPUS QUE NACE EL BEB:  Concurra a todos los controles de seguimiento y realice los anlisis de sangre segn las indicaciones de su mdico.   Mantenga un estilo de vida saludable para evitar la diabetes en el futuro. Aqu se incluye:   Siga el plan de alimentacin saludable.   Controle su peso.   Practique actividad fsica y descanse lo necesario.   No fume.   Amamante a su beb mientras pueda. Esto disminuir la probabilidad de que usted y su beb sufran diabetes posteriormente.  Para ms informacin acerca de la diabetes, visite la pgina web de la American Diabetes Association: www.americandiabetesassociation.org. Para ms informacin acerca de la diabetes gestacional cite la pgina web del American [employer] en: www.acog.org. Document Released: 06/17/2005 Document Revised: 08/27/2011 [employer] Patient Information 2012   [employer]  Embarazo - Segundo trimestre (Pregnancy - Second Trimester) El segundo trimestre del embarazo (del 3 al 6mes) es un perodo de evolucin rpida para usted y el beb. Hacia el final del sexto mes, el beb mide aproximadamente 23 cm y pesa 680 g. Comenzar a sentir los movimientos del beb entre las 18 y las 20 semanas de embarazo. Podr sentir las pataditas ("quickening en ingls"). Hay un rpido aumento de peso. Puede segregar un lquido claro (calostro) de las mamas. Quizs sienta pequeas contracciones en el vientre (tero) Esto se conoce como falso trabajo de parto o contracciones de Braxton-Hicks. Es como una prctica del trabajo de parto que  se produce cuando el beb est listo para salir. Generalmente los problemas de vmitos matinales ya se han superado hacia el final del primer trimestre. Algunas mujeres desarrollan pequeas manchas oscuras (que se denominan cloasma, mscara del embarazo) en la cara que normalmente se van luego del nacimiento del beb. La exposicin al sol empeora las manchas. Puede desarrollarse acn en algunas mujeres embarazadas, y puede desaparecer en aquellas que ya tienen acn. EXAMENES PRENATALES  Durante los exmenes prenatales, deber seguir realizando pruebas de sangre, segn avance el embarazo. Estas pruebas se realizan para controlar su salud y la del beb. Tambin se realizan anlisis de sangre para conocer los niveles de hemoglobina. La anemia (bajo nivel de hemoglobina) es frecuente durante el embarazo. Para prevenirla, se administran hierro y vitaminas. Tambin se le realizarn exmenes para saber si tiene diabetes entre las 24 y las 28 semanas del embarazo. Podrn repetirle algunas de las pruebas que le hicieron previamente.   En cada visita le medirn el tamao del tero. Esto se realiza para asegurarse de que el beb est creciendo correctamente de acuerdo al estado del embarazo.   Tambin en cada visita prenatal controlarn su presin arterial. Esto se realiza para asegurarse de que no tenga toxemia.   Se controlar su orina para asegurarse de que no tenga infecciones, diabetes o protena en la orina.   Se controlar su peso regularmente para asegurarse que el aumento ocurre al ritmo indicado. Esto se hace para asegurarse que usted y el beb tienen una evolucin normal.   En algunas ocasiones se realiza una prueba de ultrasonido para confirmar el correcto desarrollo y evolucin del beb. Esta prueba se realiza con ondas sonoras inofensivas para el beb, de modo que el profesional pueda calcular ms precisamente la fecha del parto.  Algunas veces se realizan pruebas especializadas del lquido  amnitico que rodea al beb. Esta prueba se denomina amniocentesis. El lquido amnitico se obtiene introduciendo una aguja en el vientre (abdomen). Se realiza para controlar los cromosomas en aquellos casos en los que existe alguna preocupacin acerca de algn problema gentico que pueda sufrir el beb. En ocasiones se lleva a cabo cerca del final del embarazo, si es necesario inducir al parto. En este caso se realiza para asegurarse que los pulmones del beb estn lo suficientemente maduros como para que pueda vivir fuera del tero. CAMBIOS QUE OCURREN EN EL SEGUNDO TRIMESTRE DEL EMBARAZO Su organismo atravesar numerosos cambios durante el embarazo. Estos pueden variar de una persona a otra. Converse con el profesional que la asiste acerca los cambios que usted note y que la preocupen.  Durante el segundo trimestre probablemente sienta un aumento del apetito. Es normal tener "antojos" de ciertas comidas. Esto vara de una persona a otra y de un embarazo a otro.   El abdomen inferior comenzar a abultarse.     Podr tener la necesidad de orinar con ms frecuencia debido a que el tero y el beb presionan sobre la vejiga. Tambin es frecuente contraer ms infecciones urinarias durante el embarazo (dolor al orinar). Puede evitarlas bebiendo gran cantidad de lquidos y vaciando la vejiga antes y despus de mantener relaciones sexuales.   Podrn aparecer las primeras estras en las caderas, abdomen y mamas. Estos son cambios normales del cuerpo durante el embarazo. No existen medicamentos ni ejercicios que puedan prevenir estos cambios.   Es posible que comience a desarrollar venas inflamadas y abultadas (varices) en las piernas. El uso de medias de descanso, elevar sus pies durante 15 minutos, 3 a 4 veces al da y limitar la sal en su dieta ayuda a aliviar el problema.   Podr sentir acidez gstrica a medida que el tero crece y presiona contra el estmago. Puede tomar anticidos, con la autorizacin de  su mdico, para aliviar este problema. Tambin es til ingerir pequeas comidas 4 a 5 veces al da.   La constipacin puede tratarse con un laxante o agregando fibra a su dieta. Beber grandes cantidades de lquidos, comer vegetales, frutas y granos integrales es de gran ayuda.   Tambin es beneficioso practicar actividad fsica. Si ha sido una persona activa hasta el embarazo, podr continuar con la mayora de las actividades durante el mismo. Si ha sido menos activa, puede ser beneficioso que comience con un programa de ejercicios, como realizar caminatas.   Puede desarrollar hemorroides (vrices en el recto) hacia el final del segundo trimestre. Tomar baos de asiento tibios y utilizar cremas recomendadas por el profesional que lo asiste sern de ayuda para los problemas de hemorroides.   Tambin podr sentir dolor de espalda durante este momento de su embarazo. Evite levantar objetos pesados, utilice zapatos de taco bajo y mantenga una buena postura para ayudar a reducir los problemas de espalda.   Algunas mujeres embarazadas desarrollan hormigueo y adormecimiento de la mano y los dedos debido a la hinchazn y compresin de los ligamentos de la mueca (sndrome del tnel carpiano). Esto desaparece una vez que el beb nace.   Como sus pechos se agrandan, necesitar un sujetador ms grande. Use un sostn de soporte, cmodo y de algodn. No utilice un sostn para amamantar hasta el ltimo mes de embarazo si va a amamantar al beb.   Podr observar una lnea oscura desde el ombligo hacia la zona pbica denominada linea nigra.   Podr observar que sus mejillas se ponen coloradas debido al aumento de flujo sanguneo en la cara.   Podr desarrollar "araitas" en la cara, cuello y pecho. Esto desaparece una vez que el beb nace.  INSTRUCCIONES PARA EL CUIDADO DOMICILIARIO  Es extremadamente importante que evite el cigarrillo, hierbas medicinales, alcohol y las drogas no prescriptas durante el  embarazo. Estas sustancias qumicas afectan la formacin y el desarrollo del beb. Evite estas sustancias durante todo el embarazo para asegurar el nacimiento de un beb sano.   La mayor parte de los cuidados que se aconsejan son los mismos que los indicados para el primer trimestre del embarazo. Cumpla con las citas tal como se le indic. Siga las instrucciones del profesional que lo asiste con respecto al uso de los medicamentos, el ejercicio y la dieta.   Durante el embarazo debe obtener nutrientes para usted y para su beb. Consuma alimentos balanceados a intervalos regulares. Elija alimentos como carne, pescado, leche y otros productos lcteos descremados, vegetales, frutas, panes integrales y cereales. El profesional le   informar cul es el aumento de peso ideal.   Las relaciones sexuales fsicas pueden continuarse hasta cerca del fin del embarazo si no existen otros problemas. Estos problemas pueden ser la prdida temprana (prematura) de lquido amnitico de las membranas, sangrado vaginal, dolor abdominal u otros problemas mdicos o del embarazo.   Realice actividad fsica todos los das, si no tiene restricciones. Consulte con el profesional que la asiste si no sabe con certeza si determinados ejercicios son seguros. El mayor aumento de peso tiene lugar durante los ltimos 2 trimestres del embarazo. El ejercicio la ayudar a:   Controlar su peso.   Ponerla en forma para el parto.   Ayudarla a perder peso luego de haber dado a luz.   Use un buen sostn o como los que se usan para hacer deportes para aliviar la sensibilidad de las mamas. Tambin puede serle til si lo usa mientras duerme. Si pierde calostro, podr utilizar apsitos en el sostn.   No utilice la baera con agua caliente, baos turcos y saunas durante el embarazo.   Utilice el cinturn de seguridad sin excepcin cuando conduzca. Este la proteger a usted y al beb en caso de accidente.   Evite comer carne cruda, queso  crudo, y el contacto con los utensilios y desperdicios de los gatos. Estos elementos contienen grmenes que pueden causar defectos de nacimiento en el beb.   El segundo trimestre es un buen momento para visitar a su dentista y evaluar su salud dental si an no lo ha hecho. Es importante mantener los dientes limpios. Utilice un cepillo de dientes blando. Cepllese ms suavemente durante el embarazo.   Es ms fcil perder algo de orina durante el embarazo. Apretar y fortalecer los msculos de la pelvis la ayudar con este problema. Practique detener la miccin cuando est en el bao. Estos son los mismos msculos que necesita fortalecer. Son tambin los mismos msculos que utiliza cuando trata de evitar los gases. Puede practicar apretando estos msculos 10 veces, y repetir esto 3 veces por da aproximadamente. Una vez que conozca qu msculos debe apretar, no realice estos ejercicios durante la miccin. Puede favorecerle una infeccin si la orina vuelve hacia atrs.   Pida ayuda si tiene necesidades econmicas, de asesoramiento o nutricionales durante el embarazo. El profesional podr ayudarla con respecto a estas necesidades, o derivarla a otros especialistas.   La piel puede ponerse grasa. Si esto sucede, lvese la cara con un jabn suave, utilice un humectante no graso y maquillaje con base de aceite o crema.  CONSUMO DE MEDICAMENTOS Y DROGAS DURANTE EL EMBARAZO  Contine tomando las vitaminas apropiadas para esta etapa tal como se le indic. Las vitaminas deben contener un miligramo de cido flico y deben suplementarse con hierro. Guarde todas las vitaminas fuera del alcance de los nios. La ingestin de slo un par de vitaminas o tabletas que contengan hierro puede ocasionar la muerte en un beb o en un nio pequeo.   Evite el uso de medicamentos, inclusive los de venta libre y hierbas que no hayan sido prescriptos o indicados por el profesional que la asiste. Algunos medicamentos pueden causar  problemas fsicos al beb. Utilice los medicamentos de venta libre o de prescripcin para el dolor, el malestar o la fiebre, segn se lo indique el profesional que lo asiste. No utilice aspirina.   El consumo de alcohol est relacionado con ciertos defectos de nacimiento. Esto incluye el sndrome de alcoholismo fetal. Debe evitar el consumo de alcohol en cualquiera de   sus formas. El cigarrillo causa nacimientos prematuros y bebs de bajo peso. El uso de drogas recreativas est absolutamente prohibido. Son muy nocivas para el beb. Un beb que nace de una madre adicta, ser adicto al nacer. Ese beb tendr los mismos sntomas de abstinencia que un adulto.   Infrmele al profesional si consume alguna droga.   No consuma drogas ilegales. Pueden causarle mucho dao al beb.  SOLICITE ATENCIN MDICA SI: Tiene preguntas o preocupaciones durante su embarazo. Es mejor que llame para consultar las dudas que esperar hasta su prxima visita prenatal. De esta forma se sentir ms tranquila.  SOLICITE ATENCIN MDICA DE INMEDIATO SI:  La temperatura oral se eleva sin motivo por encima de 102 F (38.9 C) o segn le indique el profesional que lo asiste.   Tiene una prdida de lquido por la vagina (canal de parto). Si sospecha una ruptura de las membranas, tmese la temperatura y llame al profesional para informarlo sobre esto.   Observa unas pequeas manchas, una hemorragia vaginal o elimina cogulos. Notifique al profesional acerca de la cantidad y de cuntos apsitos est utilizando. Unas pequeas manchas de sangre son algo comn durante el embarazo, especialmente despus de mantener relaciones sexuales.   Presenta un olor desagradable en la secrecin vaginal y observa un cambio en el color, de transparente a blanco.   Contina con las nuseas y no obtiene alivio de los remedios indicados. Vomita sangre o algo similar a la borra del caf.   [place] o sube ms de 900 g. en una semana, o segn lo indicado por  el profesional que la asiste.   Observa que se le hinchan el rostro, las manos, los pies o las piernas.   Ha estado expuesta a la rubola y no ha sufrido la enfermedad.   Ha estado expuesta a la quinta enfermedad o a la varicela.   Presenta dolor abdominal. Las molestias en el ligamento redondo son una causa no cancerosa (benigna) frecuente de dolor abdominal durante el embarazo. El profesional que la asiste deber evaluarla.   Presenta dolor de cabeza intenso que no se alivia.   Presenta fiebre, diarrea, dolor al orinar o le falta la respiracin.   Presenta dificultad para ver, visin borrosa, o visin doble.   Sufre una cada, un accidente de trnsito o cualquier tipo de trauma.   Vive en un hogar en el que existe violencia fsica o mental.  Document Released: 06/17/2005 Document Revised: 08/27/2011 [employer] Patient Information 2012 [employer]. 

## 2012-06-13 NOTE — Progress Notes (Signed)
Fasting 117, 107, 112, 102.  PP 99-132: 50% over 120. Will increase glyburide to 5 mg at night, 2.5 in AM. Discussed ways to adjust diet to improve post-prandial sugars.

## 2012-06-13 NOTE — Addendum Note (Signed)
Addended by: Kathee Delton on: 06/13/2012 01:14 PM   Modules accepted: Orders

## 2012-06-27 ENCOUNTER — Ambulatory Visit (INDEPENDENT_AMBULATORY_CARE_PROVIDER_SITE_OTHER): Payer: Self-pay | Admitting: Obstetrics & Gynecology

## 2012-06-27 VITALS — BP 103/69 | Temp 97.4°F | Wt 161.9 lb

## 2012-06-27 DIAGNOSIS — O24119 Pre-existing diabetes mellitus, type 2, in pregnancy, unspecified trimester: Secondary | ICD-10-CM

## 2012-06-27 DIAGNOSIS — O0990 Supervision of high risk pregnancy, unspecified, unspecified trimester: Secondary | ICD-10-CM

## 2012-06-27 DIAGNOSIS — O24919 Unspecified diabetes mellitus in pregnancy, unspecified trimester: Secondary | ICD-10-CM

## 2012-06-27 LAB — POCT URINALYSIS DIP (DEVICE)
Bilirubin Urine: NEGATIVE
Hgb urine dipstick: NEGATIVE
pH: 5.5 (ref 5.0–8.0)

## 2012-06-27 MED ORDER — GLYBURIDE 5 MG PO TABS: ORAL_TABLET | ORAL | Status: DC

## 2012-06-27 NOTE — Patient Instructions (Signed)
Diabetes mellitus gestacional (Gestational Diabetes Mellitus) La diabetes mellitus gestacional se produce slo durante el embarazo. Aparece cuando el organismo no puede controlar adecuadamente la glucosa (azcar) que aumenta en la sangre despus de comer. Durante el embarazo, se produce una resistencia a la insulina (sensibilidad reducida a la insulina) debido a la liberacin de hormonas por parte de la placenta. Generalmente, el pncreas de una mujer embarazada produce la cantidad suficiente de insulina para vencer esa resistencia. Sin embargo, en la diabetes gestacional, hay insulina pero no cumple su funcin adecuadamente. Si la resistencia es lo suficientemente grave como para que el pncreas no produzca la cantidad de insulina suficiente, la glucosa extra se acumula en la sangre.  QUINES TIENEN RIESGO DE DESARROLLAR DIABETES GESTACIONAL?  Las mujeres con historia de diabetes en la familia.  Las mujeres de ms de 25 aos.  Las que presentan sobrepeso.  Las mujeres que pertenecen a ciertos grupos tnicos (latinas, afroamericanas, norteamericanas nativas, asiticas y las originarias de las islas del [employer]. QUE PUEDE OCURRIRLE AL BEB? Si el nivel de glucosa en sangre de la madre es demasiado elevado mientras este embarazada, el nivel extra de azcar pasar por el cordn umbilical hacia el beb. Algunos de los problemas del beb pueden ser:  Beb demasiado grande: si el nio recibe demasiada azcar, puede aumentar mucho de peso. Esto puede hacer que sea demasiado grande para nacer por parto normal (vaginal) por lo que ser necesario realizar una cesrea.  Bajo nivel de glucosa (hipoglucemia): el beb produce insulina extra en respuesta a la excesiva cantidad de azcar que obtiene de la madre. Cuando el beb nace y ya no necesita insulina extra, su nivel de azcar en sangre puede disminuir.  Ictericia (coloracin amarillenta de la piel y los ojos): esto es bastante frecuente en los bebs. La  causa es la acumulacin de una sustancia qumica denominada bilirrubina. No siempre es un trastorno grave, pero se observa con frecuencia en los bebs cuyas madres sufren diabetes gestacional. RIESGOS PARA LA MADRE Las mujeres que han sufrido diabetes gestacional pueden tener ms riesgos para algunos problemas como:  Preeclampsia o toxemia, incluyendo problemas con hipertensin arterial. La presin arterial y los niveles de protenas en la orina deben controlarse con frecuencia.  Infecciones  Parto por cesrea.  Aparicin de diabetes tipo 2 en una etapa posterior de la vida. Alrededor del 30% al 50% sufrir diabetes posteriormente, especialmente las que son obesas. DIAGNSTICO Las hormonas que causan resistencia a la insulina tienen su mayor nivel alrededor de las 24 a 28 semanas del embarazo. Si se experimentan sntomas, stos son similares a los sntomas que normalmente aparecen durante el embarazo.  La diabetes mellitus gestacional generalmente se diagnostica por medio de un mtodo en dos partes: 1. Despus de la 24 a 28 semanas de embarazo, la mujer debe beber una solucin que contiene glucosa y realizar un anlisis de sangre. Si el nivel de glucosa es elevado, la realizarn un segundo anlisis. 2. La prueba oral de tolerancia a la glucosa, que dura aproximadamente tres horas. Despus de realizar ayuno durante la noche, se controla nivel de glucosa en sangre. La mujer bebe una solucin que contiene glucosa y le realizan anlisis de glucosa en sangre cada hora. Si la mujer tiene factores de riesgos para la diabetes mellitus gestacional, el mdico podr indicar el anlisis antes de las 24 semanas de embarazo. TRATAMIENTO El tratamiento est dirigido a mantener la glucosa en sangre de la madre en un nivel normal y puede incluir:  La   planificacin de los alimentos.  Recibir insulina u otro medicamento para controlar el nivel de glucosa en sangre.  La prctica de ejercicios.  Llevar un  registro diario de los alimentos que consume.  Control y registro de los niveles de glucosa en sangre.  Control de los niveles de cetona en la orina, aunque esto ya no se considera necesario en la mayora de los embarazos. INSTRUCCIONES PARA EL CUIDADO DOMICILIARIO Mientras est embarazada:  Siga los consejos de su mdico relacionados con los controles prenatales, la planificacin de la comida, la actividad fsica, los medicamentos, vitaminas, los anlisis de sangre y otras pruebas y las actividades fsicas.  Lleve un registro de las comidas, las pruebas de glucosa en sangre y la cantidad de insulina que recibe (si corresponde). Muestre todo al profesional en cada consulta mdica prenatal.  Si sufre diabetes mellitus gestacional, podr tener problemas de hipoglucemia (nivel bajo de glucosa en sangre). Podr sospechar este problema si se siente repentinamente mareada, tiene temblores y/o se siente dbil. Si cree que esto le est ocurriendo, y tiene un medidor de glucosa, mida su nivel de glucosa en sangre. Siga los consejos de su mdico sobre el modo y el momento de tratar su nivel de glucosa en sangre. Generalmente se sigue la regla 15:15 Consuma 15 g de hidratos de carbono, espere 15 minutos y vuelva controlar el nivel de glucosa en sangre.. Ejemplos de 15 g de hidratos de carbono son:  1 taza de leche descremada.   taza de jugo.  3-4 tabletas de glucosa.  5-6 caramelos duros.  1 caja pequea de pasas de uva.   taza de gaseosa comn.  Mantenga una buena higiene para evitar infecciones.  No fume. SOLICITE ATENCIN MDICA SI:  Observa prdida vaginal con o sin picazn.  Se siente ms dbil o cansada que lo habitual.  Transpira mucho.  Tiene un aumento de peso repentino, 2,5 kg o ms en una semana.  Pierde peso, 1.5 kg o ms en una semana.  Su nivel de glucosa en sangre es elevado, necesita instrucciones. SOLICITE ATENCIN MDICA DE INMEDIATO SI:  Sufre una cefalea  intensa.  Se marea o pierde el conocimiento  Presenta nuseas o vmitos.  Se siente desorientada confundida.  Sufre convulsiones.  Tiene problemas de visin.  Siente dolor en el estmago.  Presenta una hemorragia vaginal abundante.  Tiene contracciones uterinas.  Tiene una prdida importante de lquido por la vagina DESPUS QUE NACE EL BEB:  Concurra a todos los controles de seguimiento y realice los anlisis de sangre segn las indicaciones de su mdico.  Mantenga un estilo de vida saludable para evitar la diabetes en el futuro. Aqu se incluye:  Siga el plan de alimentacin saludable.  Controle su peso.  Practique actividad fsica y descanse lo necesario.  No fume.  Amamante a su beb mientras pueda. Esto disminuir la probabilidad de que usted y su beb sufran diabetes posteriormente. Para ms informacin acerca de la diabetes, visite la pgina web de la American Diabetes Association: www.americandiabetesassociation.org. Para ms informacin acerca de la diabetes gestacional cite la pgina web del American Congress of [employer]ians [employer]nec[employer] en: www.acog.org. Document Released: 06/17/2005 Document Revised: 11/30/2011 [employer] Patient Information 2013 [employer].  

## 2012-06-27 NOTE — Progress Notes (Signed)
FBS 86-107, pp 68-148  Will increase glyburide to 5 mg bid with breakfast and dinner.

## 2012-06-27 NOTE — Progress Notes (Signed)
Pulse: 78

## 2012-07-11 ENCOUNTER — Ambulatory Visit (INDEPENDENT_AMBULATORY_CARE_PROVIDER_SITE_OTHER): Payer: Self-pay | Admitting: Obstetrics & Gynecology

## 2012-07-11 VITALS — BP 110/71 | Temp 98.6°F | Wt 162.2 lb

## 2012-07-11 DIAGNOSIS — O24119 Pre-existing diabetes mellitus, type 2, in pregnancy, unspecified trimester: Secondary | ICD-10-CM

## 2012-07-11 DIAGNOSIS — O24919 Unspecified diabetes mellitus in pregnancy, unspecified trimester: Secondary | ICD-10-CM

## 2012-07-11 LAB — POCT URINALYSIS DIP (DEVICE)
Hgb urine dipstick: NEGATIVE
Ketones, ur: 40 mg/dL — AB
Protein, ur: NEGATIVE mg/dL
Specific Gravity, Urine: 1.02 (ref 1.005–1.030)
pH: 5.5 (ref 5.0–8.0)

## 2012-07-11 MED ORDER — PRENATAL 27-0.8 MG PO TABS: 1.0000 | ORAL_TABLET | Freq: Every day | ORAL | Status: DC

## 2012-07-11 NOTE — Progress Notes (Signed)
Few FBS >100 at 104, 106  PP usually <140, with 200, 180 on same say, dietary indiscretion

## 2012-07-11 NOTE — Progress Notes (Signed)
Pulse: 85 Needs rx for PNV 

## 2012-07-11 NOTE — Patient Instructions (Signed)
Guía de planeamiento de la alimentación para diabéticos  (Diabetes Meal Planning Guide)  La guía de planeamiento de alimentación para diabéticos es una herramienta para ayudarlo a planear sus comidas y colaciones. Es importante para las personas con diabetes controlar sus niveles de azúcar. Elegir los alimentos correctos y las cantidades adecuadas durante el día le ayudará a controlar el azúcar en sangre. Comer bien puede incluso ayudarlo a mejorar la presión sanguínea y alcanzar o mantener un peso saludable.  CUENTE LOS HIDRATOS DE CARBONO CON FACILIDAD  Cuando consume hidratos de carbono, éstos se transforman en azúcar (glucosa). Esto a su vez aumenta el nivel de azúcar en sangre. El conteo de carbohidratos puede ayudarlo a controlar este nivel para que se sienta mejor. Al planear sus alimentos con el conteo de carbohidratos, podrá tener más flexibilidad en lo que come y equilibrar la medicación con el consumo de alimentos.  El conteo de carbohidratos significa simplemente sumar la cantidad total de gramos de carbohidratos a sus comidas o colaciones. Trate de consumir la misma cantidad en cada comida. A continuación encontrará una lista de 1 porción o 15 gr. de carbohidratos. A continuación se enumeran. Pregunte al médico cuántos gramos de carbohidratos necesita comer en cada comida o colación.  Almidones y granos  · 1 rebanada de pan.  · ½ bollo inglés o bollo para hamburguesa o hotdog.  · ¾ taza de cereal frío (sin azúcar).  · ? taza de pasta o arroz cocido.  · ½ taza de vegetales que contengan almidón (maíz, papas, arvejas, porotos, calabaza).  · 1 omelette (6 pulgadas).  · ¼ bollo.  · 1 waffle o panqueque (del tamaño de un CD).  · ½ taza de cereales cocidos.  · 4 a 6 galletas saldas pequeñas.  *Se recomienda el consumo de granos enteros.  Frutas  · 1 taza de frutos rojos, melón, papaya o ananá sin azúcar.  · 1 fruta fresca pequeña.  · ½ banana o mango.  · ½ taza de jugo de frutas (4 onzas sin endulzar).  · ½  taza de fruta envasada en jugo natural o agua.  · 2 cucharadas de frutas secas.  · 12-15 uvas o cerezas.  Leche y yogurt  · 1 taza de leche descremada o al 1%.  · 1 taza de leche de soja.  · 6 onzas de yogurt descremado con edulcorante sin azúcar.  · 6 onzas de yogur descremado de soja.  · 6 onzas de yogur natural.  Vegetales  · 1 taza de vegetales crudos o ½ de vegetales cocidos se considera cero carbohidratos o una comida "libre".  · Si come 3 o más porciones en una comida, cuéntelas como 1 porción de carbohidratos.  Otros carbohidratos  · ¾ onzas de chips o pretzels.  · ½ taza de helado de crema o yogur helado.  · ¼ taza de helado de agua.  · 5 cm de torta no congelada.  · 1 cucharada de miel, azúcar, mermelada, jalea o almíbar.  · 2 galletitas dulces pequeñas.  · 3 cuadrados de crackers de graham.  · 3 tazas de palomitas de maíz.  · 6 crackers.  · 1 taza de caldo.  · Cuente 1 taza de guisado u otra mezcla de alimentos como 2 porciones de carbohidratos.  · Los alimentos con menos de 20 calorías por porción deben contarse como cero carbohidratos o alimento "libre".  Si lo desea compre un libro o software de computación que enumere la cantidad de gramos de carbohidratos de   los diferentes alimentos. Además, el panel nutricional en las etiquetas de los productos que consume es una buena fuente de información. Le indicará el tamaño de la porción y la cantidad total de carbohidratos que consumirá por cada una. Divida este número por 15 para obtener el número de conteo de carbohidratos por porción. Recuerde: cada porción son 15 gramos de carbohidratos.  PORCIONES  La medición de los alimentos y el tamaño de las porciones lo ayudarán a controlar la cantidad exacta de comida que debe ingerir. La lista que sigue le mostrará el tamaño de algunas porciones comunes.   · 1 onza.................4 dados apilados.  · 3 onzas...............Un mazo de cartas.  · 1 cucharadita.....La punta de un dedo pequeño.  · 1  cucharada........Un dedo.  · 2 cucharadas......Una pelota de golf.  · ½ taza...............La mitad de un puño.  · 1 taza................Un puño.  EJEMPLO DE PLAN DE ALIMENTACIÓN PARA DIABÉTICOS:  A continuación se muestra un ejemplo de plan de alimentación que incluye comidas de los grupos de granos y féculas, vegetales, frutas y carnes. Un nutricionista podrá confeccionarle un plan individualizado para cubrir sus necesidades calóricas y decirle el número de porciones que necesita de cada grupo. Sin embargo, podría intercambiar los alimentos que contengan carbohidratos (lácteos, cereales y frutas). Controlar la cantidad total de carbohidratos en los alimentos o colaciones es más importante que asegurarse de incluir todos los grupos alimenticios cada vez que come.   El siguiente plan de alimentación es un ejemplo de una dieta de 2000 calorías mediante el conteo de carbohidratos. Este plan contiene 17 porciones de carbohidratos.  Desayuno  · 1 taza de avena (2 porciones de carbohidratos).  · ¾ taza de yogur light(1 porción de carbohidratos).  · 1 taza de arándanos (1 porción de carbohidratos).  · ¼ taza de almendras.  Colación  · 1 manzana grande (2 porciones de carbohidratos).  · 1 palito de queso bajo en grasa.  Almuerzo  · Ensalada de pechuga de pollo.  · 1 taza de espinacas.  · ¼ taza de tomates cortados.  · 2 oz (60 gr) de pechuga de pollo en rebanadas.  · 2 cucharadas de aderezo italiano bajo en contenido graso.  · 12 galletas integrales (2 porciones de carbohidratos).  · 12 a 15 uvas (1 porción de carbohidratos).  · 1 taza de leche descremada (1porción de carbohidratos).  Colación  · 1 taza de zanahorias.  · ½ taza de puré de garbanzos (1 porción de carbohidratos).  Cena  · 3 oz (80 gr) de salmón a la parrilla.  · 1 taza de arroz integral (3 porciones de carbohidratos).  Colación  · 1 ½ taza de brócoli al vapor (1 porción de carbohidrato) con una cucharadita de aceite de oliva y jugo de limón.  · 1 taza de  budín light (2 porciones de carbohidratos).  HOJA DE PLANEAMIENTO DE LA ALIMENTACIÓN:  El dietista podrá utilizar esta hoja para ayudarlo a decidir cuántas porciones y qué tipos de alimentos son los adecuados para usted.   DESAYUNO  Grupo de alimentos y porciones / Alimento elegido  Granos/Féculas_________________________________________________  Lácteos________________________________________________________  Vegetales ______________________________________________________  Fruta __________________________________________________________  Carnes _________________________________________________________  Grasas _________________________________________________________  ALMUERZO  Grupo de alimentos y porciones / Alimento elegido  Granos/Féculas___________________________________________________  Lácteos_________________________________________________________  Fruta ___________________________________________________________  Carnes __________________________________________________________  Grasas __________________________________________________________  CENA  Grupo de alimentos y porciones / Alimento elegido  Granos/Féculas___________________________________________________  Lácteos_________________________________________________________  Fruta ___________________________________________________________  Carnes __________________________________________________________  Grasas __________________________________________________________  COLACIÓN  Grupo de alimentos y porciones /

## 2012-07-25 ENCOUNTER — Encounter: Payer: Self-pay | Admitting: Family Medicine

## 2012-07-25 ENCOUNTER — Ambulatory Visit (INDEPENDENT_AMBULATORY_CARE_PROVIDER_SITE_OTHER): Payer: Self-pay | Admitting: Family Medicine

## 2012-07-25 ENCOUNTER — Other Ambulatory Visit: Payer: Self-pay | Admitting: Family Medicine

## 2012-07-25 ENCOUNTER — Encounter: Payer: Self-pay | Attending: Obstetrics and Gynecology | Admitting: Dietician

## 2012-07-25 VITALS — BP 118/76 | Temp 98.1°F | Wt 166.1 lb

## 2012-07-25 DIAGNOSIS — O24119 Pre-existing diabetes mellitus, type 2, in pregnancy, unspecified trimester: Secondary | ICD-10-CM

## 2012-07-25 DIAGNOSIS — O09299 Supervision of pregnancy with other poor reproductive or obstetric history, unspecified trimester: Secondary | ICD-10-CM

## 2012-07-25 DIAGNOSIS — O24919 Unspecified diabetes mellitus in pregnancy, unspecified trimester: Secondary | ICD-10-CM

## 2012-07-25 DIAGNOSIS — O0990 Supervision of high risk pregnancy, unspecified, unspecified trimester: Secondary | ICD-10-CM

## 2012-07-25 DIAGNOSIS — Z713 Dietary counseling and surveillance: Secondary | ICD-10-CM | POA: Insufficient documentation

## 2012-07-25 DIAGNOSIS — O99810 Abnormal glucose complicating pregnancy: Secondary | ICD-10-CM | POA: Insufficient documentation

## 2012-07-25 LAB — POCT URINALYSIS DIP (DEVICE)
Hgb urine dipstick: NEGATIVE
Protein, ur: NEGATIVE mg/dL
Specific Gravity, Urine: 1.02 (ref 1.005–1.030)
Urobilinogen, UA: 0.2 mg/dL (ref 0.0–1.0)
pH: 5.5 (ref 5.0–8.0)

## 2012-07-25 MED ORDER — GLYBURIDE 5 MG PO TABS: ORAL_TABLET | ORAL | Status: DC

## 2012-07-25 NOTE — Progress Notes (Signed)
FBS 79-109 2 hr pp 73-190 7 of 27 out of range.  Dietary issues discussed. Feeling weak in the morning.--Making sure she has protein with hs snack. Check TSH with next labs.

## 2012-07-25 NOTE — Progress Notes (Signed)
U/S with BPP scheduled 08/08/12 at 815 am. BPP's scheduled 11/25, 12/2, and 12/9 at 845am. Fetal Echo with Dr. Elizebeth Brooking scheduled 07/28/12 at 11 am. Eye exam scheduled on 12/12 at 1145 am with Dr. Karleen Hampshire.

## 2012-07-25 NOTE — Progress Notes (Signed)
Pulse: 69

## 2012-07-25 NOTE — Patient Instructions (Signed)
Diabetes mellitus gestacional (Gestational Diabetes Mellitus) La diabetes mellitus gestacional se produce slo durante el embarazo. Aparece cuando el organismo no puede controlar adecuadamente la glucosa (azcar) que aumenta en la sangre despus de comer. Durante el embarazo, se produce una resistencia a la insulina (sensibilidad reducida a la insulina) debido a la liberacin de hormonas por parte de la placenta. Generalmente, el pncreas de una mujer embarazada produce la cantidad suficiente de insulina para vencer esa resistencia. Sin embargo, en la diabetes gestacional, hay insulina pero no cumple su funcin adecuadamente. Si la resistencia es lo suficientemente grave como para que el pncreas no produzca la cantidad de insulina suficiente, la glucosa extra se acumula en la sangre.  QUINES TIENEN RIESGO DE DESARROLLAR DIABETES GESTACIONAL?  Las mujeres con historia de diabetes en la familia.  Las mujeres de ms de 25 aos.  Las que presentan sobrepeso.  Las mujeres que pertenecen a ciertos grupos tnicos (latinas, afroamericanas, norteamericanas nativas, asiticas y las originarias de las islas del [employer]. QUE PUEDE OCURRIRLE AL BEB? Si el nivel de glucosa en sangre de la madre es demasiado elevado mientras este embarazada, el nivel extra de azcar pasar por el cordn umbilical hacia el beb. Algunos de los problemas del beb pueden ser:  Beb demasiado grande: si el nio recibe demasiada azcar, puede aumentar mucho de peso. Esto puede hacer que sea demasiado grande para nacer por parto normal (vaginal) por lo que ser necesario realizar una cesrea.  Bajo nivel de glucosa (hipoglucemia): el beb produce insulina extra en respuesta a la excesiva cantidad de azcar que obtiene de la madre. Cuando el beb nace y ya no necesita insulina extra, su nivel de azcar en sangre puede disminuir.  Ictericia (coloracin amarillenta de la piel y los ojos): esto es bastante frecuente en los bebs. La  causa es la acumulacin de una sustancia qumica denominada bilirrubina. No siempre es un trastorno grave, pero se observa con frecuencia en los bebs cuyas madres sufren diabetes gestacional. RIESGOS PARA LA MADRE Las mujeres que han sufrido diabetes gestacional pueden tener ms riesgos para algunos problemas como:  Preeclampsia o toxemia, incluyendo problemas con hipertensin arterial. La presin arterial y los niveles de protenas en la orina deben controlarse con frecuencia.  Infecciones  Parto por cesrea.  Aparicin de diabetes tipo 2 en una etapa posterior de la vida. Alrededor del 30% al 50% sufrir diabetes posteriormente, especialmente las que son obesas. DIAGNSTICO Las hormonas que causan resistencia a la insulina tienen su mayor nivel alrededor de las 24 a 28 semanas del embarazo. Si se experimentan sntomas, stos son similares a los sntomas que normalmente aparecen durante el embarazo.  La diabetes mellitus gestacional generalmente se diagnostica por medio de un mtodo en dos partes: 1. Despus de la 24 a 28 semanas de embarazo, la mujer debe beber una solucin que contiene glucosa y realizar un anlisis de sangre. Si el nivel de glucosa es elevado, la realizarn un segundo anlisis. 2. La prueba oral de tolerancia a la glucosa, que dura aproximadamente tres horas. Despus de realizar ayuno durante la noche, se controla nivel de glucosa en sangre. La mujer bebe una solucin que contiene glucosa y le realizan anlisis de glucosa en sangre cada hora. Si la mujer tiene factores de riesgos para la diabetes mellitus gestacional, el mdico podr indicar el anlisis antes de las 24 semanas de embarazo. TRATAMIENTO El tratamiento est dirigido a mantener la glucosa en sangre de la madre en un nivel normal y puede incluir:  La   planificacin de los alimentos.  Recibir insulina u otro medicamento para controlar el nivel de glucosa en sangre.  La prctica de ejercicios.  Llevar un  registro diario de los alimentos que consume.  Control y registro de los niveles de glucosa en sangre.  Control de los niveles de cetona en la orina, aunque esto ya no se considera necesario en la mayora de los embarazos. INSTRUCCIONES PARA EL CUIDADO DOMICILIARIO Mientras est embarazada:  Siga los consejos de su mdico relacionados con los controles prenatales, la planificacin de la comida, la actividad fsica, los medicamentos, vitaminas, los anlisis de sangre y otras pruebas y las actividades fsicas.  Lleve un registro de las comidas, las pruebas de glucosa en sangre y la cantidad de insulina que recibe (si corresponde). Muestre todo al profesional en cada consulta mdica prenatal.  Si sufre diabetes mellitus gestacional, podr tener problemas de hipoglucemia (nivel bajo de glucosa en sangre). Podr sospechar este problema si se siente repentinamente mareada, tiene temblores y/o se siente dbil. Si cree que esto le est ocurriendo, y tiene un medidor de glucosa, mida su nivel de glucosa en sangre. Siga los consejos de su mdico sobre el modo y el momento de tratar su nivel de glucosa en sangre. Generalmente se sigue la regla 15:15 Consuma 15 g de hidratos de carbono, espere 15 minutos y vuelva controlar el nivel de glucosa en sangre.. Ejemplos de 15 g de hidratos de carbono son:  1 taza de leche descremada.   taza de jugo.  3-4 tabletas de glucosa.  5-6 caramelos duros.  1 caja pequea de pasas de uva.   taza de gaseosa comn.  Mantenga una buena higiene para evitar infecciones.  No fume. SOLICITE ATENCIN MDICA SI:  Observa prdida vaginal con o sin picazn.  Se siente ms dbil o cansada que lo habitual.  Transpira mucho.  Tiene un aumento de peso repentino, 2,5 kg o ms en una semana.  Pierde peso, 1.5 kg o ms en una semana.  Su nivel de glucosa en sangre es elevado, necesita instrucciones. SOLICITE ATENCIN MDICA DE INMEDIATO SI:  Sufre una cefalea  intensa.  Se marea o pierde el conocimiento  Presenta nuseas o vmitos.  Se siente desorientada confundida.  Sufre convulsiones.  Tiene problemas de visin.  Siente dolor en el estmago.  Presenta una hemorragia vaginal abundante.  Tiene contracciones uterinas.  Tiene una prdida importante de lquido por la vagina DESPUS QUE NACE EL BEB:  Concurra a todos los controles de seguimiento y realice los anlisis de sangre segn las indicaciones de su mdico.  Mantenga un estilo de vida saludable para evitar la diabetes en el futuro. Aqu se incluye:  Siga el plan de alimentacin saludable.  Controle su peso.  Practique actividad fsica y descanse lo necesario.  No fume.  Amamante a su beb mientras pueda. Esto disminuir la probabilidad de que usted y su beb sufran diabetes posteriormente. Para ms informacin acerca de la diabetes, visite la pgina web de la American Diabetes Association: www.americandiabetesassociation.org. Para ms informacin acerca de la diabetes gestacional cite la pgina web del American Congress of [employer]ians [employer]nec[employer] en: www.acog.org. Document Released: 06/17/2005 Document Revised: 11/30/2011 [employer] Patient Information 2013 [employer].  

## 2012-08-08 ENCOUNTER — Ambulatory Visit (INDEPENDENT_AMBULATORY_CARE_PROVIDER_SITE_OTHER): Payer: Self-pay | Admitting: Family Medicine

## 2012-08-08 ENCOUNTER — Ambulatory Visit (HOSPITAL_COMMUNITY)
Admission: RE | Admit: 2012-08-08 | Discharge: 2012-08-08 | Disposition: A | Discharge status: Home / Self Care | Payer: Self-pay | Source: Ambulatory Visit | Attending: Family Medicine | Admitting: Family Medicine

## 2012-08-08 ENCOUNTER — Encounter: Payer: Self-pay | Admitting: Dietician

## 2012-08-08 VITALS — BP 121/80 | Temp 97.5°F | Wt 168.9 lb

## 2012-08-08 DIAGNOSIS — E669 Obesity, unspecified: Secondary | ICD-10-CM | POA: Insufficient documentation

## 2012-08-08 DIAGNOSIS — O09299 Supervision of pregnancy with other poor reproductive or obstetric history, unspecified trimester: Secondary | ICD-10-CM | POA: Insufficient documentation

## 2012-08-08 DIAGNOSIS — O99810 Abnormal glucose complicating pregnancy: Secondary | ICD-10-CM | POA: Insufficient documentation

## 2012-08-08 DIAGNOSIS — O24119 Pre-existing diabetes mellitus, type 2, in pregnancy, unspecified trimester: Secondary | ICD-10-CM

## 2012-08-08 DIAGNOSIS — O0990 Supervision of high risk pregnancy, unspecified, unspecified trimester: Secondary | ICD-10-CM

## 2012-08-08 DIAGNOSIS — O24919 Unspecified diabetes mellitus in pregnancy, unspecified trimester: Secondary | ICD-10-CM

## 2012-08-08 LAB — POCT URINALYSIS DIP (DEVICE)
Bilirubin Urine: NEGATIVE
Glucose, UA: 100 mg/dL — AB
Ketones, ur: NEGATIVE mg/dL
Specific Gravity, Urine: 1.02 (ref 1.005–1.030)

## 2012-08-08 LAB — TSH: TSH: 1.885 u[IU]/mL (ref 0.350–4.500)

## 2012-08-08 LAB — CBC
HCT: 37.2 % (ref 36.0–46.0)
Hemoglobin: 12.9 g/dL (ref 12.0–15.0)
WBC: 7.3 10*3/uL (ref 4.0–10.5)

## 2012-08-08 NOTE — Progress Notes (Signed)
Pulse: 68

## 2012-08-08 NOTE — Progress Notes (Signed)
Nutrition note: f/u visit Pt referred for occ high BS. Pt has gained 16.9# @ [redacted]w[redacted]d, which is > expected. BS: Fasting 81-129, 2hr pp 61-161.  Pt reports eating 3 meals & 2-3 snacks/d, is taking PNV, and has no N&V. Pt reports walking 3x/wk for 20 mins. Pt reports noticing high BS after eating certain dinners such as: 2 tortillas, rice, beans and steak but not after other dinners such as: salad with chicken. Pt reports feeling hungry soon after latter example. Reviewed GDM diet, which includes 3 CHO servings with dinner so encouraged pt to eliminate 1 CHO serving with the 1st dinner & add 3 CHO servings to the 2nd dinner. Also reviewed foods that pt can eat that won't affect her BS if she's feeling hungry (nuts, cheese, meat).  Provided pt with example meal plans in Spanish at pt's request. Pt agrees to try suggestions. F/u 2-4 weeks  Blondell Reveal, MS, RD, LDN

## 2012-08-08 NOTE — Progress Notes (Signed)
Fasting 81-129 (4/9 over 95) PP 61-161 (9/23 over 120).  Notes that when she eats tortillas, rice, beans sugars are higher but she doesn't feel full unless she eats these.  Will talk to nutritionist today regarding healthy snacks, fillers that won't raise her BS. Continue current dose of glyburide and adjust diet. F/U 1 week. Had BPP today, results not back yet. Weekly BPPs till delivery due to prior IUFD at 37 wks.  Notes swelling in hands, thinks from cooking/heat at work. No HA or vision changes. BP okay. No proteinuria.

## 2012-08-08 NOTE — Patient Instructions (Signed)
Diabetes mellitus gestacional (Gestational Diabetes Mellitus) La diabetes mellitus gestacional se produce slo durante el embarazo. Aparece cuando el organismo no puede controlar adecuadamente la glucosa (azcar) que aumenta en la sangre despus de comer. Durante el embarazo, se produce una resistencia a la insulina (sensibilidad reducida a la insulina) debido a la liberacin de hormonas por parte de la placenta. Generalmente, el pncreas de una mujer embarazada produce la cantidad suficiente de insulina para vencer esa resistencia. Sin embargo, en la diabetes gestacional, hay insulina pero no cumple su funcin adecuadamente. Si la resistencia es lo suficientemente grave como para que el pncreas no produzca la cantidad de insulina suficiente, la glucosa extra se acumula en la sangre.  QUINES TIENEN RIESGO DE DESARROLLAR DIABETES GESTACIONAL?  Las mujeres con historia de diabetes en la familia.  Las mujeres de ms de 25 aos.  Las que presentan sobrepeso.  Las mujeres que pertenecen a ciertos grupos tnicos (latinas, afroamericanas, norteamericanas nativas, asiticas y las originarias de las islas del [employer]. QUE PUEDE OCURRIRLE AL BEB? Si el nivel de glucosa en sangre de la madre es demasiado elevado mientras este embarazada, el nivel extra de azcar pasar por el cordn umbilical hacia el beb. Algunos de los problemas del beb pueden ser:  Beb demasiado grande: si el nio recibe demasiada azcar, puede aumentar mucho de peso. Esto puede hacer que sea demasiado grande para nacer por parto normal (vaginal) por lo que ser necesario realizar una cesrea.  Bajo nivel de glucosa (hipoglucemia): el beb produce insulina extra en respuesta a la excesiva cantidad de azcar que obtiene de la madre. Cuando el beb nace y ya no necesita insulina extra, su nivel de azcar en sangre puede disminuir.  Ictericia (coloracin amarillenta de la piel y los ojos): esto es bastante frecuente en los bebs. La  causa es la acumulacin de una sustancia qumica denominada bilirrubina. No siempre es un trastorno grave, pero se observa con frecuencia en los bebs cuyas madres sufren diabetes gestacional. RIESGOS PARA LA MADRE Las mujeres que han sufrido diabetes gestacional pueden tener ms riesgos para algunos problemas como:  Preeclampsia o toxemia, incluyendo problemas con hipertensin arterial. La presin arterial y los niveles de protenas en la orina deben controlarse con frecuencia.  Infecciones  Parto por cesrea.  Aparicin de diabetes tipo 2 en una etapa posterior de la vida. Alrededor del 30% al 50% sufrir diabetes posteriormente, especialmente las que son obesas. DIAGNSTICO Las hormonas que causan resistencia a la insulina tienen su mayor nivel alrededor de las 24 a 28 semanas del embarazo. Si se experimentan sntomas, stos son similares a los sntomas que normalmente aparecen durante el embarazo.  La diabetes mellitus gestacional generalmente se diagnostica por medio de un mtodo en dos partes: 1. Despus de la 24 a 28 semanas de embarazo, la mujer debe beber una solucin que contiene glucosa y realizar un anlisis de sangre. Si el nivel de glucosa es elevado, la realizarn un segundo anlisis. 2. La prueba oral de tolerancia a la glucosa, que dura aproximadamente tres horas. Despus de realizar ayuno durante la noche, se controla nivel de glucosa en sangre. La mujer bebe una solucin que contiene glucosa y le realizan anlisis de glucosa en sangre cada hora. Si la mujer tiene factores de riesgos para la diabetes mellitus gestacional, el mdico podr indicar el anlisis antes de las 24 semanas de embarazo. TRATAMIENTO El tratamiento est dirigido a mantener la glucosa en sangre de la madre en un nivel normal y puede incluir:  La   planificacin de los alimentos.  Recibir insulina u otro medicamento para controlar el nivel de glucosa en sangre.  La prctica de ejercicios.  Llevar un  registro diario de los alimentos que consume.  Control y registro de los niveles de glucosa en sangre.  Control de los niveles de cetona en la orina, aunque esto ya no se considera necesario en la mayora de los embarazos. INSTRUCCIONES PARA EL CUIDADO DOMICILIARIO Mientras est embarazada:  Siga los consejos de su mdico relacionados con los controles prenatales, la planificacin de la comida, la actividad fsica, los medicamentos, vitaminas, los anlisis de sangre y otras pruebas y las actividades fsicas.  Lleve un registro de las comidas, las pruebas de glucosa en sangre y la cantidad de insulina que recibe (si corresponde). Muestre todo al profesional en cada consulta mdica prenatal.  Si sufre diabetes mellitus gestacional, podr tener problemas de hipoglucemia (nivel bajo de glucosa en sangre). Podr sospechar este problema si se siente repentinamente mareada, tiene temblores y/o se siente dbil. Si cree que esto le est ocurriendo, y tiene un medidor de glucosa, mida su nivel de glucosa en sangre. Siga los consejos de su mdico sobre el modo y el momento de tratar su nivel de glucosa en sangre. Generalmente se sigue la regla 15:15 Consuma 15 g de hidratos de carbono, espere 15 minutos y vuelva controlar el nivel de glucosa en sangre.. Ejemplos de 15 g de hidratos de carbono son:  1 taza de leche descremada.   taza de jugo.  3-4 tabletas de glucosa.  5-6 caramelos duros.  1 caja pequea de pasas de uva.   taza de gaseosa comn.  Mantenga una buena higiene para evitar infecciones.  No fume. SOLICITE ATENCIN MDICA SI:  Observa prdida vaginal con o sin picazn.  Se siente ms dbil o cansada que lo habitual.  Transpira mucho.  Tiene un aumento de peso repentino, 2,5 kg o ms en una semana.  Pierde peso, 1.5 kg o ms en una semana.  Su nivel de glucosa en sangre es elevado, necesita instrucciones. SOLICITE ATENCIN MDICA DE INMEDIATO SI:  Sufre una cefalea  intensa.  Se marea o pierde el conocimiento  Presenta nuseas o vmitos.  Se siente desorientada confundida.  Sufre convulsiones.  Tiene problemas de visin.  Siente dolor en el estmago.  Presenta una hemorragia vaginal abundante.  Tiene contracciones uterinas.  Tiene una prdida importante de lquido por la vagina DESPUS QUE NACE EL BEB:  Concurra a todos los controles de seguimiento y realice los anlisis de sangre segn las indicaciones de su mdico.  Mantenga un estilo de vida saludable para evitar la diabetes en el futuro. Aqu se incluye:  Siga el plan de alimentacin saludable.  Controle su peso.  Practique actividad fsica y descanse lo necesario.  No fume.  Amamante a su beb mientras pueda. Esto disminuir la probabilidad de que usted y su beb sufran diabetes posteriormente. Para ms informacin acerca de la diabetes, visite la pgina web de la American Diabetes Association: www.americandiabetesassociation.org. Para ms informacin acerca de la diabetes gestacional cite la pgina web del American [employer] of [employer]nec[employer] en: www.acog.org. Document Released: 06/17/2005 Document Revised: 11/30/2011 [employer] Patient Information 2013 [employer].   Embarazo  Tercer trimestre  (Pregnancy - Third Trimester) El tercer trimestre del embarazo (los ltimos 3 meses) es el perodo en el cual tanto usted como su beb crecen con ms rapidez. El beb alcanza un largo de aproximadamente 50 cm. y pesa entre 2,700 y 4,500 kg.   El beb gana ms tejido graso y est listo para la vida fuera del cuerpo de la madre. Mientras estn en el interior, los bebs tienen perodos de sueo y vigilia, succionan el pulgar y tienen hipo. Quizs sienta pequeas contracciones del tero. Este es el falso trabajo de parto. Tambin se las conoce como contracciones de Braxton-Hicks . Es como una prctica del parto. Los problemas ms habituales de esta etapa del embarazo incluyen  mayor dificultad para respirar, hinchazn de las manos y los pies por retencin de lquidos y la necesidad de orinar con ms frecuencia debido a que el tero y el beb presionan sobre la vejiga.  EXAMENES PRENATALES   Durante los exmenes prenatales, deber seguir realizndose anlisis de sangre. Estas pruebas se realizan para controlar su salud y la del beb. Los anlisis de sangre se realizan para conocer los niveles de algunos compuestos de la sangre (hemoglobina). La anemia (bajo nivel de hemoglobina) es frecuente durante el embarazo. Para prevenirla, se administran hierro y vitaminas. Tambin le tomarn nuevas anlisis para descartar diabetes. Podrn repetirle algunas de las pruebas que le hicieron previamente.  En cada visita le medirn el tamao del tero. Esto permite asegurar que el beb se desarrolla adecuadamente, segn la fecha del embarazo.  Le controlarn la presin arterial en cada visita prenatal. Esto es para asegurarse de que no sufre toxemia.  Le harn un anlisis de orina en cada visita prenatal, para descartar infecciones, diabetes y la presencia de protenas.  Tambin en cada visita controlarn su peso. Esto se realiza para asegurarse que aumenta de peso al ritmo indicado y que usted y su beb evolucionan normalmente.  En algunas ocasiones se realiza una prueba de ultrasonido para confirmar el correcto desarrollo y evolucin del beb. Esta prueba se realiza con ondas sonoras inofensivas para el beb, de modo que el profesional pueda calcular ms precisamente la fecha del parto.  Analice con su mdico los analgsicos y la anestesia que recibir durante el trabajo de parto y el parto.  Comente la posibilidad de que necesite una cesrea y qu anestesia se recibir.  Informe a su mdico si sufre violencia familiar mental o fsica. A veces, se indica la prueba especializada sin estrs, la prueba de tolerancia a las contracciones y el perfil biofsico para asegurarse de que el  beb no tiene problemas. El estudio del lquido amnitico que rodea al beb se llama amniocentesis. El lquido amnitico se obtiene introduciendo una aguja en el vientre (abdomen ). En ocasiones se lleva a cabo cerca del final del embarazo, si es necesario inducir a un parto. En este caso se realiza para asegurarse que los pulmones del beb estn lo suficientemente maduros como para que pueda vivir fuera del tero. Si los pulmones no han madurado y es peligroso que el beb nazca, se administrar a la madre una inyeccin de cortisona , 1 a 2 das antes del parto. . Esto ayuda a que los pulmones del beb maduren y sea ms seguro su nacimiento.  CAMBIOS QUE OCURREN EN EL TERCER TRIMESTRE DEL EMBARAZO  Su organismo atravesar numerosos cambios durante el embarazo. Estos pueden variar de una persona a otra. Converse con el profesional que la asiste acerca los cambios que usted note y que la preocupen.   Durante el ltimo trimestre probablemente sienta un aumento del apetito. Es normal tener "antojos" de ciertas comidas. Esto vara de una persona a otra y de un embarazo a otro.  Podrn aparecer las primeras estras en las caderas, abdomen   y mamas. Estos son cambios normales del cuerpo durante el embarazo. No existen medicamentos ni ejercicios que puedan prevenir estos cambios.  La constipacin puede tratarse con un laxante o agregando fibra a su dieta. Beber grandes cantidades de lquidos, tomar fibras en forma de vegetales, frutas y granos integrales es de gran ayuda.  Tambin es beneficioso practicar actividad fsica. Si ha sido una persona activa hasta el embarazo, podr continuar con la mayora de las actividades durante el mismo. Si ha sido menos activa, puede ser beneficioso que comience con un programa de ejercicios, como realizar caminatas. Consulte con el profesional que la asiste antes de comenzar un programa de ejercicios.  Evite el consumo de cigarrillos, el alcohol, los medicamentos no recetados y  las "drogas de la calle" durante el embarazo. Estas sustancias qumicas afectan la formacin y el desarrollo del beb. Evite estas sustancias durante todo el embarazo para asegurar el nacimiento de un beb sano.  Podr sentir dolor de espalda, tener vrices en las venas y hemorroides, o si ya los sufra, pueden empeorar.  Durante el tercer trimestre se cansar con ms facilidad, lo cual es normal.  Los movimientos del beb pueden ser ms fuertes y con ms frecuencia.  Puede que note dificultades para respirar normalmente.  El ombligo puede salir hacia afuera.  A veces sale una secrecin amarilla de las mamas, que se llama calostro.  Podr aparecer una secrecin mucosa con sangre. Esto suele ocurrir entre unos pocos das y una semana antes del parto. INSTRUCCIONES PARA EL CUIDADO EN EL HOGAR   Cumpla con las citas de control. Siga las indicaciones del mdico con respecto al uso de medicamentos, los ejercicios y la dieta.  Durante el embarazo debe obtener nutrientes para usted y para su beb. Consuma alimentos balanceados a intervalos regulares. Elija alimentos como carne, pescado, leche y otros productos lcteos descremados, vegetales, frutas, panes integrales y cereales. El mdico le informar cul es el aumento de peso ideal.  Las relaciones sexuales pueden continuarse hasta casi el final del embarazo, si no se presentan otros problemas como prdida prematura (antes de tiempo) de lquido amnitico, hemorragia vaginal o dolor en el vientre (abdominal).  Realice actividad fsica todos los das, si no tiene restricciones. Consulte con el profesional que la asiste si no sabe con certeza si determinados ejercicios son seguros. El mayor aumento de peso se producir en los ltimos 2 trimestres del embarazo. El ejercicio ayuda a:  Controlar su peso.  Mantenerse en forma para el trabajo de parto y el parto .  Perder peso despus del parto.  Haga reposo con frecuencia, con las piernas elevadas,  o segn lo necesite para evitar los calambres y el dolor de cintura.  Use un buen sostn o como los que se usan para hacer deportes para aliviar la sensibilidad de las mamas. Tambin puede serle til si lo usa mientras duerme. Si pierde calostro, podr utilizar apsitos en el sostn.  No utilice la baera con agua caliente, baos turcos y saunas.  Colquese el cinturn de seguridad cuando conduzca. Este la proteger a usted y al beb en caso de accidente.  Evite comer carne cruda y el contacto con los utensilios y desperdicios de los gatos. Estos elementos contienen grmenes que pueden causar defectos de nacimiento en el beb.  Es fcil perder algo de orina durante el embarazo. Apretar y fortalecer los msculos de la pelvis la ayudar con este problema. Practique detener la miccin cuando est en el bao. Estos son los mismos msculos   que necesita fortalecer. Son tambin los mismos msculos que utiliza cuando trata de evitar despedir gases. Puede practicar apretando estos msculos diez veces, y repetir esto tres veces por da aproximadamente. Una vez que conozca qu msculos debe apretar, no realice estos ejercicios durante la miccin. Puede favorecerle una infeccin si la orina vuelve hacia atrs.  Pida ayuda si tienen necesidades financieras, teraputicas o nutricionales. El profesional podr ayudarla con respecto a estas necesidades, o derivarla a otros especialistas.  Haga una lista de nmeros telefnicos de emergencia y tngalos disponibles.  Planifique como obtener ayuda de familiares o amigos cuando regrese a casa desde el hospital.  Hacer un ensayo sobre la partida al hospital.  Tome clases prenatales con el padre para entender, practicar y hacer preguntas sobre el trabajo de parto y el alumbramiento.  Preparar la habitacin del beb / busque una guardera.  No viaje fuera de la ciudad a menos que sea absolutamente necesario y con el asesoramiento de su mdico.  Use slo zapatos de  tacn bajo o sin tacn para tener mejor equilibrio y evitar cadas. USO DE MEDICAMENTOS Y CONSUMO DE DROGAS DURANTE EL EMBARAZO   Tome las vitaminas apropiadas para esta etapa tal como se le indic. Las vitaminas deben contener un miligramo de cido flico. Guarde todas las vitaminas fuera del alcance de los nios. La ingestin de slo un par de vitaminas o tabletas que contengan hierro pueden ocasionar la muerte en un beb o en un nio pequeo.  Evite el uso de todos los medicamentos, incluyendo hierbas, medicamentos de venta libre, sin receta o que no hayan sido sugeridos por su mdico. Slo tome medicamentos de venta libre o medicamentos recetados para el dolor, el malestar o fiebre como lo indique su mdico. No tome aspirina, ibuprofeno (Motrin, Advil, Nuprin) o naproxeno (Aleve) excepto que su mdico se lo indique.  Infrmele al profesional si consume alguna droga.  El alcohol se relaciona con ciertos defectos congnitos. Incluye el sndrome de alcoholismo fetal. Debe evitar absolutamente el consumo de alcohol, en cualquier forma. El fumar produce baja tasa de natalidad y bebs prematuros.  Las drogas ilegales o de la calle son muy perjudiciales para el beb. Estn absolutamente prohibidas. Un beb que nace de una madre adicta, ser adicto al nacer. Ese beb tendr los mismos sntomas de abstinencia que un adulto. SOLICITE ATENCIN MDICA SI:  Tiene preguntas o preocupaciones relacionadas con el embarazo. Es mejor que llame para formular las preguntas si no puede esperar hasta la prxima visita, que sentirse preocupada por ellas.  DECISIONES ACERCA DE LA CIRCUNCISIN  Usted puede saber o no cul es el sexo de su beb. Si ya sabe que ser un varn, este es el momento de pensar acerca de la circuncisin. La circuncisin es la extirpacin del prepucio. Esta es la piel que cubre el extremo sensible del pene. No hay un motivo mdico que lo justifique. Generalmente la decisin se toma segn lo que  sea popular en ese momento, o segn creencias religiosas. Podr conversar estos temas con su mdico o con el pediatra.  SOLICITE ATENCIN MDICA DE INMEDIATO SI:   La temperatura oral le sube a ms de 102 F (38.9 C) o lo que su mdico le indique.  Tiene una prdida de lquido por la vagina (canal de parto). Si sospecha una ruptura de las membranas, tmese la temperatura y llame al profesional para informarlo sobre esto.  Observa unas pequeas manchas, una hemorragia vaginal o elimina cogulos. Notifique al profesional acerca de la   cantidad y de cuntos apsitos est utilizando.  Presenta un olor desagradable en la secrecin vaginal y observa un cambio en el color, de transparente a blanco.  Ha vomitado durante ms de 24 horas.  Siente escalofros o le sube la fiebre.  Le falta el aire.  Siente ardor al orinar.  Baja o sube ms de 2 libras (900 g), o segn lo indicado por el profesional que la asiste.  Observa que sbitamente se le hinchan el rostro, las manos, los pies o las piernas.  Siente dolor en el vientre (abdominal). Las molestias en el ligamento redondo son una causa benigna frecuente de dolor abdominal durante el embarazo. El profesional que la asiste deber evaluarla.  Presenta dolor de cabeza intenso que no se alivia.  Tiene problemas visuales, visin doble o borrosa.  Si no siente los movimientos del beb durante ms de 1 hora. Si piensa que el beb no se mueve tanto como lo haca habitualmente, coma algo que contenga azcar y recustese sobre el lado izquierdo durante una hora. El beb debe moverse al menos 4  5 veces por hora. Comunquese inmediatamente si el beb se mueve menos que lo indicado.  Se cae, se ve involucrada en un accidente automovilstico o sufre algn tipo de traumatismo.  En su hogar hay violencia mental o fsica. Document Released: 06/17/2005 Document Revised: 03/08/2012 [employer] Patient Information 2013 [employer].  

## 2012-08-08 NOTE — Progress Notes (Signed)
Diabetes Education:  Seen today for malfunctioning meter.  Reports she dropped her meter on the floor and it now gives constant error Messages.  Did not bring the meter to clinic.  Provided a new True Track Meter kit.  Lot: [id number]  Exp: 2014/06/30.  On return demonstration, her post-breakfast glucose was 97 mg/dl.  Maggie Clinton Dragone, RN, RD, CDE

## 2012-08-15 ENCOUNTER — Encounter: Payer: Self-pay | Admitting: Dietician

## 2012-08-15 ENCOUNTER — Ambulatory Visit (INDEPENDENT_AMBULATORY_CARE_PROVIDER_SITE_OTHER): Payer: Self-pay | Admitting: Obstetrics and Gynecology

## 2012-08-15 ENCOUNTER — Ambulatory Visit (HOSPITAL_COMMUNITY): Admission: RE | Admit: 2012-08-15 | Payer: Self-pay | Source: Ambulatory Visit

## 2012-08-15 VITALS — BP 116/81 | Temp 97.0°F | Wt 167.1 lb

## 2012-08-15 DIAGNOSIS — E669 Obesity, unspecified: Secondary | ICD-10-CM

## 2012-08-15 DIAGNOSIS — O09299 Supervision of pregnancy with other poor reproductive or obstetric history, unspecified trimester: Secondary | ICD-10-CM

## 2012-08-15 LAB — POCT URINALYSIS DIP (DEVICE)
Bilirubin Urine: NEGATIVE
Glucose, UA: NEGATIVE mg/dL
Hgb urine dipstick: NEGATIVE
Ketones, ur: NEGATIVE mg/dL
Nitrite: NEGATIVE
Specific Gravity, Urine: 1.02 (ref 1.005–1.030)

## 2012-08-15 NOTE — Patient Instructions (Signed)
Mtodo para contar los movimientos fetales (Fetal Movement Counts) Nombre de la paciente: __________________________________________________ Fecha probable de parto:____________________ En los embarazos de alto riesgo se recomienda contar las pataditas, pero tambin es una buena idea que lo hagan todas las embarazadas. Comience a contarlas a las 28 semanas de embarazo. Los movimientos fetales aumentan luego de una comida completa o de comer o beber algo dulce (el nivel de azcar en la sangre est ms alto). Tambin es importante beber gran cantidad de lquidos (hidratarse bien) antes de contar. Si se recuesta sobre el lado izquierdo mejorar la circulacin, o puede sentarse en una silla cmoda con los brazos sobre el abdomen y sin distracciones que la rodeen. CONTANDO  Trate de contar a la misma hora todos los das que lo haga.  Marque el da y la hora y vea cunto le lleva sentir 10 movimientos (patadas, agitaciones, sacudones, vueltas). Debe sentir al menos 10 movimientos en 2 horas. Probablemente sienta los 10 movimientos en menos de dos horas. Si no los siente, espere una hora y cuente nuevamente. Luego de algunos das tendr un patrn.  Debemos observar si hay cambios en el patrn o no hay suficientes pataditas en 2 horas. Le lleva ms tiempo contar los 10 movimientos? SOLICITE ATENCIN MDICA SI:  Siente menos de 10 pataditas en 2 horas. Intntelo dos veces.  No siente movimientos durante 1 hora.  El patrn se modifica o le lleva ms tiempo cada da contar las 10 pataditas.  Siente que el beb no se mueve como lo hace habitualmente. Fecha: ____________ Movimientos: ____________ Comienzo hora: ____________ Fin hora: ____________ Fecha: ____________ Movimientos: ____________ Comienzo hora: ____________ Fin hora: ____________ Fecha: ____________ Movimientos: ____________ Comienzo hora: ____________ Fin hora: ____________ Fecha: ____________ Movimientos: ____________ Comienzo hora:  ____________ Fin hora: ____________ Fecha: ____________ Movimientos: ____________ Comienzo hora: ____________ Fin hora: ____________ Fecha: ____________ Movimientos: ____________ Comienzo hora: ____________ Fin hora: ____________ Fecha: ____________ Movimientos: ____________ Comienzo hora: ____________ Fin hora: ____________  Fecha: ____________ Movimientos: ____________ Comienzo hora: ____________ Fin hora: ____________ Fecha: ____________ Movimientos: ____________ Comienzo hora: ____________ Fin hora: ____________ Fecha: ____________ Movimientos: ____________ Comienzo hora: ____________ Fin hora: ____________ Fecha: ____________ Movimientos: ____________ Comienzo hora: ____________ Fin hora: ____________ Fecha: ____________ Movimientos: ____________ Comienzo hora: ____________ Fin hora: ____________ Fecha: ____________ Movimientos: ____________ Comienzo hora: ____________ Fin hora: ____________ Fecha: ____________ Movimientos: ____________ Comienzo hora: ____________ Fin hora: ____________  Fecha: ____________ Movimientos: ____________ Comienzo hora: ____________ Fin hora: ____________ Fecha: ____________ Movimientos: ____________ Comienzo hora: ____________ Fin hora: ____________ Fecha: ____________ Movimientos: ____________ Comienzo hora: ____________ Fin hora: ____________ Fecha: ____________ Movimientos: ____________ Comienzo hora: ____________ Fin hora: ____________ Fecha: ____________ Movimientos: ____________ Comienzo hora: ____________ Fin hora: ____________ Fecha: ____________ Movimientos: ____________ Comienzo hora: ____________ Fin hora: ____________ Fecha: ____________ Movimientos: ____________ Comienzo hora: ____________ Fin hora: ____________  Fecha: ____________ Movimientos: ____________ Comienzo hora: ____________ Fin hora: ____________ Fecha: ____________ Movimientos: ____________ Comienzo hora: ____________ Fin hora: ____________ Fecha: ____________ Movimientos: ____________  Comienzo hora: ____________ Fin hora: ____________ Fecha: ____________ Movimientos: ____________ Comienzo hora: ____________ Fin hora: ____________ Fecha: ____________ Movimientos: ____________ Comienzo hora: ____________ Fin hora: ____________ Fecha: ____________ Movimientos: ____________ Comienzo hora: ____________ Fin hora: ____________ Fecha: ____________ Movimientos: ____________ Comienzo hora: ____________ Fin hora: ____________  Fecha: ____________ Movimientos: ____________ Comienzo hora: ____________ Fin hora: ____________ Fecha: ____________ Movimientos: ____________ Comienzo hora: ____________ Fin hora: ____________ Fecha: ____________ Movimientos: ____________ Comienzo hora: ____________ Fin hora: ____________ Fecha: ____________ Movimientos: ____________ Comienzo hora: ____________ Fin hora: ____________ Fecha: ____________ Movimientos: ____________ Comienzo hora: ____________ Fin hora: ____________   Fecha: ____________ Movimientos: ____________ Comienzo hora: ____________ Fin hora: ____________ Fecha: ____________ Movimientos: ____________ Comienzo hora: ____________ Fin hora: ____________  Fecha: ____________ Movimientos: ____________ Comienzo hora: ____________ Fin hora: ____________ Fecha: ____________ Movimientos: ____________ Comienzo hora: ____________ Fin hora: ____________ Fecha: ____________ Movimientos: ____________ Comienzo hora: ____________ Fin hora: ____________ Fecha: ____________ Movimientos: ____________ Comienzo hora: ____________ Fin hora: ____________ Fecha: ____________ Movimientos: ____________ Comienzo hora: ____________ Fin hora: ____________ Fecha: ____________ Movimientos: ____________ Comienzo hora: ____________ Fin hora: ____________ Fecha: ____________ Movimientos: ____________ Comienzo hora: ____________ Fin hora: ____________  Fecha: ____________ Movimientos: ____________ Comienzo hora: ____________ Fin hora: ____________ Fecha: ____________ Movimientos:  ____________ Comienzo hora: ____________ Fin hora: ____________ Fecha: ____________ Movimientos: ____________ Comienzo hora: ____________ Fin hora: ____________ Fecha: ____________ Movimientos: ____________ Comienzo hora: ____________ Fin hora: ____________ Fecha: ____________ Movimientos: ____________ Comienzo hora: ____________ Fin hora: ____________ Fecha: ____________ Movimientos: ____________ Comienzo hora: ____________ Fin hora: ____________ Fecha: ____________ Movimientos: ____________ Comienzo hora: ____________ Fin hora: ____________  Fecha: ____________ Movimientos: ____________ Comienzo hora: ____________ Fin hora: ____________ Fecha: ____________ Movimientos: ____________ Comienzo hora: ____________ Fin hora: ____________ Fecha: ____________ Movimientos: ____________ Comienzo hora: ____________ Fin hora: ____________ Fecha: ____________ Movimientos: ____________ Comienzo hora: ____________ Fin hora: ____________ Fecha: ____________ Movimientos: ____________ Comienzo hora: ____________ Fin hora: ____________ Fecha: ____________ Movimientos: ____________ Comienzo hora: ____________ Fin hora: ____________ Fecha: ____________ Movimientos: ____________ Comienzo hora: ____________ Fin hora: ____________  Document Released: 12/15/2007 Document Revised: 11/30/2011 [employer] Patient Information 2013 [employer].  

## 2012-08-15 NOTE — Progress Notes (Signed)
F: 74-91 2 hr pc [phone]; 2h pc lunch all in range except isolated 147, 2 hr pc [phone]. See Maggie. FATs fro hx fetal demise.

## 2012-08-15 NOTE — Progress Notes (Signed)
Pulse: 84

## 2012-08-15 NOTE — Progress Notes (Signed)
Diabetes education:  Seen to review her diet.  Review of portion sizes and the need to be consistent with servings of rice, tortillas. Review of major portion sizes.  Will try to f/u at next visit.  Maggie Audel Coakley, RN. RD, CDE

## 2012-08-22 ENCOUNTER — Ambulatory Visit (INDEPENDENT_AMBULATORY_CARE_PROVIDER_SITE_OTHER): Payer: Self-pay | Admitting: Obstetrics & Gynecology

## 2012-08-22 ENCOUNTER — Other Ambulatory Visit: Payer: Self-pay | Admitting: Medical

## 2012-08-22 ENCOUNTER — Ambulatory Visit (HOSPITAL_COMMUNITY)
Admission: RE | Admit: 2012-08-22 | Discharge: 2012-08-22 | Disposition: A | Discharge status: Home / Self Care | Payer: Self-pay | Source: Ambulatory Visit | Attending: Family Medicine | Admitting: Family Medicine

## 2012-08-22 VITALS — BP 127/84 | Temp 97.4°F | Wt 168.9 lb

## 2012-08-22 DIAGNOSIS — O0990 Supervision of high risk pregnancy, unspecified, unspecified trimester: Secondary | ICD-10-CM

## 2012-08-22 DIAGNOSIS — O24119 Pre-existing diabetes mellitus, type 2, in pregnancy, unspecified trimester: Secondary | ICD-10-CM

## 2012-08-22 DIAGNOSIS — O99810 Abnormal glucose complicating pregnancy: Secondary | ICD-10-CM | POA: Insufficient documentation

## 2012-08-22 DIAGNOSIS — O24919 Unspecified diabetes mellitus in pregnancy, unspecified trimester: Secondary | ICD-10-CM

## 2012-08-22 DIAGNOSIS — O09299 Supervision of pregnancy with other poor reproductive or obstetric history, unspecified trimester: Secondary | ICD-10-CM

## 2012-08-22 DIAGNOSIS — E669 Obesity, unspecified: Secondary | ICD-10-CM

## 2012-08-22 LAB — POCT URINALYSIS DIP (DEVICE)
Bilirubin Urine: NEGATIVE
Hgb urine dipstick: NEGATIVE
Ketones, ur: NEGATIVE mg/dL
Leukocytes, UA: NEGATIVE
pH: 5.5 (ref 5.0–8.0)

## 2012-08-22 NOTE — Patient Instructions (Signed)
Return to clinic for any obstetric concerns or go to [facility] for evaluation  

## 2012-08-22 NOTE — Progress Notes (Signed)
BPP today is 8/8, continue weekly BPP. CBGs are within normal limits except for one fasting in 100s, two postprandials of 130.  Will continue Glyburide as prescribed. No other complaints or concerns.  Fetal movement and labor precautions reviewed.

## 2012-08-22 NOTE — Progress Notes (Signed)
Pulse: 66

## 2012-08-29 ENCOUNTER — Ambulatory Visit (INDEPENDENT_AMBULATORY_CARE_PROVIDER_SITE_OTHER): Payer: Self-pay | Admitting: Obstetrics & Gynecology

## 2012-08-29 ENCOUNTER — Ambulatory Visit (HOSPITAL_COMMUNITY)
Admission: RE | Admit: 2012-08-29 | Discharge: 2012-08-29 | Disposition: A | Discharge status: Home / Self Care | Payer: Self-pay | Source: Ambulatory Visit | Attending: Family Medicine | Admitting: Family Medicine

## 2012-08-29 VITALS — BP 121/85 | Temp 98.0°F | Wt 170.5 lb

## 2012-08-29 DIAGNOSIS — O0990 Supervision of high risk pregnancy, unspecified, unspecified trimester: Secondary | ICD-10-CM

## 2012-08-29 DIAGNOSIS — O09299 Supervision of pregnancy with other poor reproductive or obstetric history, unspecified trimester: Secondary | ICD-10-CM

## 2012-08-29 DIAGNOSIS — O99210 Obesity complicating pregnancy, unspecified trimester: Secondary | ICD-10-CM | POA: Insufficient documentation

## 2012-08-29 DIAGNOSIS — O24119 Pre-existing diabetes mellitus, type 2, in pregnancy, unspecified trimester: Secondary | ICD-10-CM

## 2012-08-29 DIAGNOSIS — O99810 Abnormal glucose complicating pregnancy: Secondary | ICD-10-CM | POA: Insufficient documentation

## 2012-08-29 DIAGNOSIS — E669 Obesity, unspecified: Secondary | ICD-10-CM | POA: Insufficient documentation

## 2012-08-29 DIAGNOSIS — O24919 Unspecified diabetes mellitus in pregnancy, unspecified trimester: Secondary | ICD-10-CM

## 2012-08-29 LAB — POCT URINALYSIS DIP (DEVICE)
Ketones, ur: NEGATIVE mg/dL
Protein, ur: 100 mg/dL — AB
Specific Gravity, Urine: 1.025 (ref 1.005–1.030)

## 2012-08-29 MED ORDER — GLYBURIDE 5 MG PO TABS: ORAL_TABLET | ORAL | Status: DC

## 2012-08-29 NOTE — Progress Notes (Signed)
Fastings:  90,95,97,90,77,102,90.  Post prandials 46-106; 109-130; 56-136.  Will increase Glyburide 1.5 tablets QHS (pt is only taking 1 tablet at nght currently.).  Continue 1 tablet q a.m.  Pt complaining of hand swelling .  1+ protein on dip.  Will do a clean catch (first specimen was not).  If still abnml will send for culture.

## 2012-08-29 NOTE — Progress Notes (Signed)
Pulse- 61  Edema-hands

## 2012-09-05 ENCOUNTER — Ambulatory Visit (INDEPENDENT_AMBULATORY_CARE_PROVIDER_SITE_OTHER): Payer: Medicaid Other | Admitting: Family

## 2012-09-05 VITALS — BP 132/91 | Temp 98.6°F | Wt 171.0 lb

## 2012-09-05 DIAGNOSIS — O24919 Unspecified diabetes mellitus in pregnancy, unspecified trimester: Secondary | ICD-10-CM

## 2012-09-05 DIAGNOSIS — O24119 Pre-existing diabetes mellitus, type 2, in pregnancy, unspecified trimester: Secondary | ICD-10-CM

## 2012-09-05 DIAGNOSIS — O0990 Supervision of high risk pregnancy, unspecified, unspecified trimester: Secondary | ICD-10-CM

## 2012-09-05 DIAGNOSIS — O09299 Supervision of pregnancy with other poor reproductive or obstetric history, unspecified trimester: Secondary | ICD-10-CM

## 2012-09-05 LAB — POCT URINALYSIS DIP (DEVICE)
Bilirubin Urine: NEGATIVE
Glucose, UA: NEGATIVE mg/dL
Specific Gravity, Urine: 1.025 (ref 1.005–1.030)
Urobilinogen, UA: 0.2 mg/dL (ref 0.0–1.0)

## 2012-09-05 NOTE — Progress Notes (Signed)
BPP 8/8 12/9; Repeat BP 123/79; FBS 74-93 (1/6 abnl); B 59-134 (1/5); L 76-130 (2/5 abnl); D 109-128 (2/5 abnl); No questions or concerns; begin 2/week testing Thursday.  Growth [place] scheduled for next week.

## 2012-09-05 NOTE — Progress Notes (Signed)
Pulse 69  2ndBP: 123/79 C/o edema trace in hands.

## 2012-09-05 NOTE — Progress Notes (Signed)
Diabetes Education:  Provided 1 box of strips Lot [id number] Exp: 2014/11/19.  And 1 box lancets Lot: [id number]  Exp 2016/02/22.  Kimberly Moniqua Engebretsen, RN, RD, CDE.

## 2012-09-08 ENCOUNTER — Ambulatory Visit (INDEPENDENT_AMBULATORY_CARE_PROVIDER_SITE_OTHER): Payer: Self-pay | Admitting: *Deleted

## 2012-09-08 ENCOUNTER — Ambulatory Visit (HOSPITAL_COMMUNITY)
Admission: RE | Admit: 2012-09-08 | Discharge: 2012-09-08 | Disposition: A | Discharge status: Home / Self Care | Payer: Self-pay | Source: Ambulatory Visit | Attending: Obstetrics & Gynecology | Admitting: Obstetrics & Gynecology

## 2012-09-08 DIAGNOSIS — O24919 Unspecified diabetes mellitus in pregnancy, unspecified trimester: Secondary | ICD-10-CM

## 2012-09-08 DIAGNOSIS — O09299 Supervision of pregnancy with other poor reproductive or obstetric history, unspecified trimester: Secondary | ICD-10-CM | POA: Insufficient documentation

## 2012-09-08 DIAGNOSIS — E669 Obesity, unspecified: Secondary | ICD-10-CM | POA: Insufficient documentation

## 2012-09-08 DIAGNOSIS — O368390 Maternal care for abnormalities of the fetal heart rate or rhythm, unspecified trimester, not applicable or unspecified: Secondary | ICD-10-CM | POA: Insufficient documentation

## 2012-09-08 DIAGNOSIS — O99810 Abnormal glucose complicating pregnancy: Secondary | ICD-10-CM | POA: Insufficient documentation

## 2012-09-08 NOTE — Progress Notes (Signed)
Pt arrived for scheduled NST/AFI per protocol. FHR could not be heard with EFM cardio- bedside US performed and absent cardiac activity observed. Pt informed that she will have formal US in Radiology dept but suspect that she likely has fetal demise. Interpreter- Marlynn Perking in room and explained to pt.  Emotional support given as pt became upset and tearful.  Pt sent to [place] dept w/interpreter.  Dr. Debroah Loop notified of pt status.

## 2012-09-09 ENCOUNTER — Encounter (HOSPITAL_COMMUNITY): Payer: Self-pay | Admitting: Anesthesiology

## 2012-09-09 ENCOUNTER — Encounter (HOSPITAL_COMMUNITY): Payer: Self-pay

## 2012-09-09 ENCOUNTER — Inpatient Hospital Stay (HOSPITAL_COMMUNITY)
Admission: RE | Admit: 2012-09-09 | Discharge: 2012-09-11 | DRG: 775 | Disposition: A | Discharge status: Home / Self Care | Payer: Medicaid Other | Source: Ambulatory Visit | Attending: Obstetrics & Gynecology | Admitting: Obstetrics & Gynecology

## 2012-09-09 ENCOUNTER — Inpatient Hospital Stay (HOSPITAL_COMMUNITY): Payer: Medicaid Other | Admitting: Anesthesiology

## 2012-09-09 VITALS — BP 115/74 | HR 80 | Temp 98.3°F | Resp 17 | Ht 59.0 in | Wt 171.0 lb

## 2012-09-09 DIAGNOSIS — O24119 Pre-existing diabetes mellitus, type 2, in pregnancy, unspecified trimester: Secondary | ICD-10-CM

## 2012-09-09 DIAGNOSIS — O99814 Abnormal glucose complicating childbirth: Secondary | ICD-10-CM

## 2012-09-09 DIAGNOSIS — O0990 Supervision of high risk pregnancy, unspecified, unspecified trimester: Secondary | ICD-10-CM

## 2012-09-09 DIAGNOSIS — O364XX0 Maternal care for intrauterine death, not applicable or unspecified: Principal | ICD-10-CM | POA: Diagnosis present

## 2012-09-09 DIAGNOSIS — IMO0002 Reserved for concepts with insufficient information to code with codable children: Secondary | ICD-10-CM | POA: Diagnosis present

## 2012-09-09 LAB — CBC
HCT: 39.9 % (ref 36.0–46.0)
MCH: 32.4 pg (ref 26.0–34.0)
MCV: 93 fL (ref 78.0–100.0)
Platelets: 175 10*3/uL (ref 150–400)
Platelets: 182 10*3/uL (ref 150–400)
RBC: 4.29 MIL/uL (ref 3.87–5.11)
RBC: 3.95 MIL/uL (ref 3.87–5.11)
WBC: 9.3 10*3/uL (ref 4.0–10.5)
WBC: 9.3 10*3/uL (ref 4.0–10.5)

## 2012-09-09 LAB — COMPREHENSIVE METABOLIC PANEL
ALT: 24 U/L (ref 0–35)
Albumin: 2.7 g/dL — ABNORMAL LOW (ref 3.5–5.2)
Alkaline Phosphatase: 195 U/L — ABNORMAL HIGH (ref 39–117)
BUN: 12 mg/dL (ref 6–23)
Chloride: 100 mEq/L (ref 96–112)
Glucose, Bld: 132 mg/dL — ABNORMAL HIGH (ref 70–99)
Potassium: 4.2 mEq/L (ref 3.5–5.1)
Sodium: 133 mEq/L — ABNORMAL LOW (ref 135–145)
Total Bilirubin: 0.4 mg/dL (ref 0.3–1.2)

## 2012-09-09 LAB — KLEIHAUER-BETKE STAIN: Fetal Cells %: 0 %

## 2012-09-09 LAB — GLUCOSE, CAPILLARY
Glucose-Capillary: 109 mg/dL — ABNORMAL HIGH (ref 70–99)
Glucose-Capillary: 102 mg/dL — ABNORMAL HIGH (ref 70–99)

## 2012-09-09 LAB — RAPID URINE DRUG SCREEN, HOSP PERFORMED
Amphetamines: NOT DETECTED
Benzodiazepines: NOT DETECTED
Opiates: NOT DETECTED
Tetrahydrocannabinol: NOT DETECTED

## 2012-09-09 MED ORDER — DIPHENHYDRAMINE HCL 50 MG/ML IJ SOLN: 12.5000 mg | INTRAMUSCULAR | Status: DC | PRN

## 2012-09-09 MED ORDER — PHENYLEPHRINE 40 MCG/ML (10ML) SYRINGE FOR IV PUSH (FOR BLOOD PRESSURE SUPPORT)
80.0000 ug | PREFILLED_SYRINGE | INTRAVENOUS | Status: DC | PRN
  Filled 2012-09-09: qty 5

## 2012-09-09 MED ORDER — LACTATED RINGERS IV SOLN
INTRAVENOUS | Status: DC
  Administered 2012-09-09 (×2): via INTRAVENOUS

## 2012-09-09 MED ORDER — GLYBURIDE 5 MG PO TABS
5.0000 mg | ORAL_TABLET | Freq: Every day | ORAL | Status: DC
  Administered 2012-09-09: 5 mg via ORAL
  Filled 2012-09-09 (×2): qty 1

## 2012-09-09 MED ORDER — MISOPROSTOL 200 MCG PO TABS
600.0000 ug | ORAL_TABLET | ORAL | Status: DC | PRN
  Administered 2012-09-09 (×3): 600 ug via VAGINAL
  Filled 2012-09-09: qty 1
  Filled 2012-09-09: qty 3
  Filled 2012-09-09: qty 2
  Filled 2012-09-09: qty 3

## 2012-09-09 MED ORDER — OXYTOCIN 40 UNITS IN LACTATED RINGERS INFUSION - SIMPLE MED
62.5000 mL/h | INTRAVENOUS | Status: DC
  Administered 2012-09-09: 62.5 mL/h via INTRAVENOUS
  Filled 2012-09-09: qty 1000

## 2012-09-09 MED ORDER — PHENYLEPHRINE 40 MCG/ML (10ML) SYRINGE FOR IV PUSH (FOR BLOOD PRESSURE SUPPORT): 80.0000 ug | PREFILLED_SYRINGE | INTRAVENOUS | Status: DC | PRN

## 2012-09-09 MED ORDER — GLYBURIDE 5 MG PO TABS: 5.0000 mg | ORAL_TABLET | Freq: Every day | ORAL | Status: DC

## 2012-09-09 MED ORDER — GLYBURIDE 5 MG PO TABS
7.5000 mg | ORAL_TABLET | Freq: Every day | ORAL | Status: DC
  Administered 2012-09-09: 7.5 mg via ORAL
  Filled 2012-09-09: qty 1

## 2012-09-09 MED ORDER — IBUPROFEN 600 MG PO TABS
600.0000 mg | ORAL_TABLET | Freq: Four times a day (QID) | ORAL | Status: DC | PRN
  Administered 2012-09-10: 600 mg via ORAL
  Filled 2012-09-09: qty 1

## 2012-09-09 MED ORDER — ONDANSETRON HCL 4 MG/2ML IJ SOLN: 4.0000 mg | Freq: Four times a day (QID) | INTRAMUSCULAR | Status: DC | PRN

## 2012-09-09 MED ORDER — CITRIC ACID-SODIUM CITRATE 334-500 MG/5ML PO SOLN: 30.0000 mL | ORAL | Status: DC | PRN

## 2012-09-09 MED ORDER — ACETAMINOPHEN 325 MG PO TABS
650.0000 mg | ORAL_TABLET | ORAL | Status: DC | PRN
  Administered 2012-09-09 (×2): 650 mg via ORAL
  Filled 2012-09-09 (×2): qty 2

## 2012-09-09 MED ORDER — EPHEDRINE 5 MG/ML INJ
10.0000 mg | INTRAVENOUS | Status: DC | PRN
  Filled 2012-09-09: qty 4

## 2012-09-09 MED ORDER — FENTANYL CITRATE 0.05 MG/ML IJ SOLN
100.0000 ug | INTRAMUSCULAR | Status: DC | PRN
  Administered 2012-09-09 (×2): 100 ug via INTRAVENOUS
  Filled 2012-09-09 (×2): qty 2

## 2012-09-09 MED ORDER — GLYBURIDE 5 MG PO TABS: 5.0000 mg | ORAL_TABLET | Freq: Two times a day (BID) | ORAL | Status: DC

## 2012-09-09 MED ORDER — FENTANYL 2.5 MCG/ML BUPIVACAINE 1/10 % EPIDURAL INFUSION (WH - ANES)
14.0000 mL/h | INTRAMUSCULAR | Status: DC
  Administered 2012-09-09: 12 mL/h via EPIDURAL
  Filled 2012-09-09: qty 125

## 2012-09-09 MED ORDER — OXYCODONE-ACETAMINOPHEN 5-325 MG PO TABS: 1.0000 | ORAL_TABLET | ORAL | Status: DC | PRN

## 2012-09-09 MED ORDER — LIDOCAINE HCL (PF) 1 % IJ SOLN
30.0000 mL | INTRAMUSCULAR | Status: DC | PRN
  Filled 2012-09-09: qty 30

## 2012-09-09 MED ORDER — SODIUM BICARBONATE 8.4 % IV SOLN
INTRAVENOUS | Status: DC | PRN
  Administered 2012-09-09: 4 mL via EPIDURAL

## 2012-09-09 MED ORDER — EPHEDRINE 5 MG/ML INJ: 10.0000 mg | INTRAVENOUS | Status: DC | PRN

## 2012-09-09 MED ORDER — OXYTOCIN BOLUS FROM INFUSION: 500.0000 mL | INTRAVENOUS | Status: DC

## 2012-09-09 MED ORDER — LACTATED RINGERS IV SOLN
500.0000 mL | Freq: Once | INTRAVENOUS | Status: AC
  Administered 2012-09-09: 500 mL via INTRAVENOUS

## 2012-09-09 MED ORDER — LACTATED RINGERS IV SOLN: 500.0000 mL | INTRAVENOUS | Status: DC | PRN

## 2012-09-09 NOTE — Anesthesia Procedure Notes (Signed)
Epidural Patient location during procedure: OB  Preanesthetic Checklist Completed: patient identified, site marked, surgical consent, pre-op evaluation, timeout performed, IV checked, risks and benefits discussed and monitors and equipment checked  Epidural Patient position: sitting Prep: site prepped and draped and DuraPrep Patient monitoring: continuous pulse ox and blood pressure Approach: midline Injection technique: LOR air  Needle:  Needle type: Tuohy  Needle gauge: 17 G Needle length: 9 cm and 9 Needle insertion depth: 5 cm cm Catheter type: closed end flexible Catheter size: 19 Gauge Catheter at skin depth: 11 cm Test dose: negative  Assessment Events: blood not aspirated, injection not painful, no injection resistance, negative IV test and no paresthesia  Additional Notes Dosing of Epidural:  1st dose, through catheter ............................................. epi 1:200K + Xylocaine 40 mg  2nd dose, through catheter, after waiting 3 minutes.....epi 1:200K + Xylocaine 40 mg   ( 2% Xylo charted as a single dose in Epic Meds for ease of charting; actual dosing was fractionated as above, for saftey's sake)  As each dose occurred, patient was free of IV sx; and patient exhibited no evidence of SA injection.  Patient is more comfortable after epidural dosed. Please see RN's note for documentation of vital signs,and FHR which are stable.  Patient reminded not to try to ambulate with numb legs, and that an RN must be present the 1st time she attempts to get up.    

## 2012-09-09 NOTE — Progress Notes (Signed)
Pt called out to nurses station, rn at bedside to assess pt. Fetus delivered in bed

## 2012-09-09 NOTE — H&P (Signed)
Kimberly Wilkerson is a [id number] 31 y.o. female presenting for IOL due to IUFD. She has received prenatal care at the [facility] and presented for her antenatal testing on 12/19 to find absence of fetal heartrate (confirmed by radiology). Her care has been remarkable for a) Class A2/B DM (dx at 14-15wks) requiring Glyburide 5mg  q AM, 7.5mg  q PM b) hx 37wk IUFD with 2nd preg (no autopsy, no DM; BPPs weekly since 28wks with most recent 8/8 on 12/9). Denies leak, bldg or pain. History OB History    Grav Para Term Preterm Abortions TAB SAB Ect Mult Living   4 3 3  0 0 0 0 0 0 2     Past Medical History  Diagnosis Date  . Gestational diabetes   . Gonorrhea 2005  . Type 2 diabetes mellitus complicating pregnancy, antepartum 06/06/2012   Past Surgical History  Procedure Date  . No past surgeries    Family History: family history is not on file. Social History:  reports that she has never smoked. She has never used smokeless tobacco. She reports that she does not drink alcohol or use illicit drugs.    ROS    Blood pressure 116/76, pulse 80, temperature 98 F (36.7 C), resp. rate 18, height 4\' 11"  (1.499 m), weight 77.565 kg (171 lb), last menstrual period 01/25/2012. Maternal Exam:  Uterine Assessment: No ctx noted per toco     Fetal Exam Fetal Monitor Review: Bedside U/S: no FHT detected     Physical Exam  Constitutional: She is oriented to person, place, and time. She appears well-developed.  HENT:  Head: Normocephalic.  Neck: Normal range of motion.  Cardiovascular: Normal rate.   Respiratory: Effort normal.  Musculoskeletal: Normal range of motion.  Neurological: She is alert and oriented to person, place, and time.  Skin: Skin is warm and dry.  Psychiatric: She has a normal mood and affect. Her behavior is normal. Thought content normal.    Prenatal labs: ABO, Rh: B/Positive/-- (08/21 0000) Antibody: Negative (08/21 0000) Rubella: Immune (08/21 0000) RPR: NON REAC (11/18  1016)  HBsAg: Negative (08/21 0000)  HIV: Non-reactive (08/21 0000)  GBS:   unk  Assessment/Plan: IUP at 32.4wks IUFD DM class A2/B Unfavorable cx  Admit to [employer] [facility] confirmed by bedside U/S  Will use cytotec for cx ripening Support offered/questions answered Continue Glyburide 5mg  AM, 7.5mg  PM with CBGs q 4 hrs   Kimberly Wilkerson 09/09/2012, 10:16 AM

## 2012-09-09 NOTE — Progress Notes (Signed)
MD wants another cbc before epidural

## 2012-09-09 NOTE — Anesthesia Preprocedure Evaluation (Signed)
Anesthesia Evaluation  Patient identified by MRN, date of birth, ID band Patient awake    Reviewed: Allergy & Precautions, H&P , Patient's Chart, lab work & pertinent test results  Airway Mallampati: II TM Distance: >3 FB Neck ROM: full    Dental  (+) Teeth Intact   Pulmonary  breath sounds clear to auscultation        Cardiovascular Rhythm:regular Rate:Normal     Neuro/Psych    GI/Hepatic   Endo/Other  diabetesMorbid obesity  Renal/GU      Musculoskeletal   Abdominal   Peds  Hematology   Anesthesia Other Findings       Reproductive/Obstetrics (+) Pregnancy                           Anesthesia Physical Anesthesia Plan  ASA: III  Anesthesia Plan: Epidural   Post-op Pain Management:    Induction:   Airway Management Planned:   Additional Equipment:   Intra-op Plan:   Post-operative Plan:   Informed Consent: I have reviewed the patients History and Physical, chart, labs and discussed the procedure including the risks, benefits and alternatives for the proposed anesthesia with the patient or authorized representative who has indicated his/her understanding and acceptance.   Dental Advisory Given  Plan Discussed with:   Anesthesia Plan Comments: (Labs checked- platelets confirmed with RN in room. Fetal heart tracing, per RN, reported to be stable enough for sitting procedure. Discussed epidural, and patient consents to the procedure:  included risk of possible headache,backache, failed block, allergic reaction, and nerve injury. This patient was asked if she had any questions or concerns before the procedure started. )        Anesthesia Quick Evaluation  

## 2012-09-09 NOTE — Progress Notes (Signed)
Asked to see patient due to IUFD- met with patient via spanish interpreter- Patient laying in bed- in obvious discomfort,  tells me this will be her 2nd IUFD. She has a 33yo and an 33yo boy at home along with her husband.  We discussed her feelings related to the death of this unborn and the plans for induction and delivery- she is sad and a little tearful- states she was able to grieve the loss of her last IUFD on her own and through her community of strong support. The patient is wanting to seek assistance with burial as she states they cannot afford it- she indicates they had assistance last time through [facility] and that the baby was buried at [facility] [place], [place] area). I have spoken with Melynda Ripple who indicate patient can contact them when they want to schedule a time to meet them for planning- they do not charge for their services as long as they are ok with the casket they provide. As for the burial, I have left a message for [place] at [facility]) to discuss possible burial assistance- the family hopes to bury this child also there with her sibling- await callback.   Please call CSW as needs or assistance arrive. I will brief the weekend CSW to be aware and prepared to check in for support and assistance tomorrow as appropriate.    Reece Levy, MSW, Theresia Majors [phone]

## 2012-09-09 NOTE — Progress Notes (Addendum)
Chaplain referred at transition that Ms Barsch has experienced an IUFD. At this time she has not delivered her child. Interpreter not available to assist Chaplain at this time. Will Follow up as requested or required.  Through the translator spoke with Ms Ardyth Harps. 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 She goes to [facility] on [address], but if priest from that parish cannot come, a on-call priest will be acceptable. It is important to Ms Ardyth Harps and her family that a priest comes to deliver rites and provide comfort. When needed, Page Chaplain and he will call priest and make further arrangements.  PLEASE PAGE Chaplain to provide spiritual care when Ms Geni Bers needs spiritual care of any type or a Zorita Pang is required.  Benjie Karvonen. Taras Rask, D.Min., APC Chaplain.

## 2012-09-10 ENCOUNTER — Encounter (HOSPITAL_COMMUNITY): Payer: Self-pay

## 2012-09-10 LAB — GLUCOSE, RANDOM: Glucose, Bld: 68 mg/dL — ABNORMAL LOW (ref 70–99)

## 2012-09-10 MED ORDER — ONDANSETRON HCL 4 MG/2ML IJ SOLN: 4.0000 mg | INTRAMUSCULAR | Status: DC | PRN

## 2012-09-10 MED ORDER — OXYCODONE-ACETAMINOPHEN 5-325 MG PO TABS: 1.0000 | ORAL_TABLET | ORAL | Status: DC | PRN

## 2012-09-10 MED ORDER — BENZOCAINE-MENTHOL 20-0.5 % EX AERO: 1.0000 "application " | INHALATION_SPRAY | CUTANEOUS | Status: DC | PRN

## 2012-09-10 MED ORDER — PRENATAL MULTIVITAMIN CH
1.0000 | ORAL_TABLET | Freq: Every day | ORAL | Status: DC
  Administered 2012-09-11: 1 via ORAL
  Filled 2012-09-10: qty 1

## 2012-09-10 MED ORDER — TETANUS-DIPHTH-ACELL PERTUSSIS 5-2.5-18.5 LF-MCG/0.5 IM SUSP: 0.5000 mL | Freq: Once | INTRAMUSCULAR | Status: DC

## 2012-09-10 MED ORDER — SIMETHICONE 80 MG PO CHEW: 80.0000 mg | CHEWABLE_TABLET | ORAL | Status: DC | PRN

## 2012-09-10 MED ORDER — IBUPROFEN 600 MG PO TABS
600.0000 mg | ORAL_TABLET | Freq: Four times a day (QID) | ORAL | Status: DC
  Administered 2012-09-10 – 2012-09-11 (×5): 600 mg via ORAL
  Filled 2012-09-10 (×5): qty 1

## 2012-09-10 MED ORDER — DIPHENHYDRAMINE HCL 25 MG PO CAPS: 25.0000 mg | ORAL_CAPSULE | Freq: Four times a day (QID) | ORAL | Status: DC | PRN

## 2012-09-10 MED ORDER — DIBUCAINE 1 % RE OINT: 1.0000 "application " | TOPICAL_OINTMENT | RECTAL | Status: DC | PRN

## 2012-09-10 MED ORDER — ZOLPIDEM TARTRATE 5 MG PO TABS: 5.0000 mg | ORAL_TABLET | Freq: Every evening | ORAL | Status: DC | PRN

## 2012-09-10 MED ORDER — ONDANSETRON HCL 4 MG PO TABS: 4.0000 mg | ORAL_TABLET | ORAL | Status: DC | PRN

## 2012-09-10 MED ORDER — SENNOSIDES-DOCUSATE SODIUM 8.6-50 MG PO TABS
2.0000 | ORAL_TABLET | Freq: Every day | ORAL | Status: DC
  Administered 2012-09-10: 2 via ORAL

## 2012-09-10 MED ORDER — LANOLIN HYDROUS EX OINT: TOPICAL_OINTMENT | CUTANEOUS | Status: DC | PRN

## 2012-09-10 MED ORDER — WITCH HAZEL-GLYCERIN EX PADS: 1.0000 "application " | MEDICATED_PAD | CUTANEOUS | Status: DC | PRN

## 2012-09-10 NOTE — Progress Notes (Signed)
Post Partum Day 1 Subjective: no complaints and patien obviously tearful and emotional  Objective: Blood pressure 119/78, pulse 72, temperature 98.1 F (36.7 C), temperature source Oral, resp. rate 18, height 4\' 11"  (1.499 m), weight 77.565 kg (171 lb), last menstrual period 01/25/2012, SpO2 98.00%.  Physical Exam:  General: alert, cooperative and no distress Lochia: appropriate Uterine Fundus: firm Incision: na DVT Evaluation: No evidence of DVT seen on physical exam.   Basename 09/09/12 2123 09/09/12 1020  HGB 12.8 13.9  HCT 36.4 39.9    Assessment/Plan: Evaluate for discharge later, patient not ready this am   LOS: 1 day   Kimberly Wilkerson 09/10/2012, 9:50 AM

## 2012-09-10 NOTE — Anesthesia Postprocedure Evaluation (Signed)
  Anesthesia Post-op Note  Patient: Kimberly Wilkerson  Procedure(s) Performed: * No procedures listed *  Patient Location: [facility] Unit  Anesthesia Type:Epidural  Level of Consciousness: awake, alert  and oriented  Airway and Oxygen Therapy: Patient Spontanous Breathing  Post-op Pain: mild  Post-op Assessment: Patient's Cardiovascular Status Stable, Respiratory Function Stable, Patent Airway, No signs of Nausea or vomiting and Pain level controlled  Post-op Vital Signs: stable  Complications: No apparent anesthesia complications

## 2012-09-10 NOTE — Progress Notes (Signed)
Chaplain paged at 2342 after Ms Beutler had delivered her daughter in her bed. Nurses were asked if she wished a Zorita Pang to be call from her parish - [address] [facility] [facility] on [address]. Before the chaplain came to the hospital; to be with Ms Jarmon he called Glennis Brink at [phone] and left a message for Father Ree Kida or Father Billey Gosling to come as soon as possible at the request of Ms Wannamaker. Chaplain arrived in unit at 0012. Through the translator the chaplain was asked to pray a prayer of blessing for the child Lafonda Mosses). Holding the child a prayer of blessing was prayed. After returning the child to her mother's arms a prayer was prayed for the parents for God to give them strength to endure the pain of their loss. Chaplain was asked to leave the room while medical staff performed needed procedures. Chaplain is on call until 0830 Monday, 23 December. He will follow up as required or needed.Leonette Most D. Navaeh Kehres, D.Min. APC Chaplain

## 2012-09-10 NOTE — Plan of Care (Signed)
Problem: Discharge Progression Outcomes Goal: Barriers To Progression Addressed/Resolved Outcome: Completed/Met Date Met:  09/10/12 Assistance from Spanish interpreter.

## 2012-09-11 DIAGNOSIS — IMO0002 Reserved for concepts with insufficient information to code with codable children: Secondary | ICD-10-CM | POA: Diagnosis present

## 2012-09-11 HISTORY — DX: Reserved for concepts with insufficient information to code with codable children: IMO0002

## 2012-09-11 MED ORDER — IBUPROFEN 600 MG PO TABS: 600.0000 mg | ORAL_TABLET | Freq: Four times a day (QID) | ORAL | Status: DC | PRN

## 2012-09-11 NOTE — Clinical Social Work Note (Signed)
Late Entry  CSW met with [facility] 09/10/12 to look into more options for plots for infant.  Unfortunately most cemeteries are full or are not offering financial assistance on plots at this time.  CSW spoke with priest who stated he did not know of any churches that would donate a plot for family.  CSW is going to ask weekday CSW to contact [facility] when they are open on Monday to see if they have any further resources.  [phone]

## 2012-09-11 NOTE — Discharge Summary (Signed)
Obstetric Discharge Summary Reason for Admission: fetal demise diagnosed by ultrasound Prenatal Procedures: ultrasound Intrapartum Procedures: spontaneous vaginal delivery Postpartum Procedures: none Complications-Operative and Postpartum: none Hemoglobin  Date Value Range Status  09/09/2012 12.8  12.0 - 15.0 g/dL Final  1/61/0960 45.4   Final     HCT  Date Value Range Status  09/09/2012 36.4  36.0 - 46.0 % Final  05/11/2012 36   Final    Physical Exam:  General: alert, cooperative and no distress Lochia: appropriate Uterine Fundus: firm DVT Evaluation: No evidence of DVT seen on physical exam.  Discharge Diagnoses: premature delivery of stillborn infant  Discharge Information: Date: 09/11/2012 Activity: pelvic rest Diet: routine Medications: Ibuprofen Condition: stable Instructions: fetal demise Discharge to: home Follow-up Information    Follow up with [facility]. In 4 weeks.         Newborn Data: Live born female  Birth Weight: 3 lb 2.1 oz (1420 g) APGAR: 0, 0  Fetus to funeral home  Yosselin Zoeller 09/11/2012, 10:26 AM

## 2012-09-12 ENCOUNTER — Other Ambulatory Visit: Payer: Self-pay

## 2012-09-12 LAB — TYPE AND SCREEN
ABO/RH(D): B POS
Antibody Screen: POSITIVE
DAT, IgG: NEGATIVE
Unit division: 0

## 2012-10-06 ENCOUNTER — Ambulatory Visit (INDEPENDENT_AMBULATORY_CARE_PROVIDER_SITE_OTHER): Payer: Medicaid Other | Admitting: Obstetrics & Gynecology

## 2012-10-06 ENCOUNTER — Encounter: Payer: Self-pay | Admitting: Obstetrics & Gynecology

## 2012-10-06 DIAGNOSIS — O24439 Gestational diabetes mellitus in the puerperium, unspecified control: Secondary | ICD-10-CM

## 2012-10-06 DIAGNOSIS — O364XX0 Maternal care for intrauterine death, not applicable or unspecified: Secondary | ICD-10-CM

## 2012-10-06 DIAGNOSIS — O99815 Abnormal glucose complicating the puerperium: Secondary | ICD-10-CM

## 2012-10-06 NOTE — Patient Instructions (Signed)
Levonorgestrel intrauterine device (IUD) Qu es este medicamento? El LEVONORGESTREL (DIU) es un dispositivo anticonceptivo (control de natalidad). Se utiliza para Location manager durante un perodo de [place]. Este medicamento puede ser utilizado para otros usos; si tiene alguna pregunta consulte con su proveedor de atencin mdica o con su farmacutico. Qu le debo informar a mi profesional de la salud antes de tomar este medicamento? Necesita saber si usted presenta alguno de los siguientes problemas o situaciones: -exmen de Papanicolaou anormal -cncer de mama, cuello del tero o tero -diabetes -endometritis -si tiene una infeccin plvica o genital actual o en el pasado -tiene ms de una pareja sexual o si su pareja tiene ms de una pareja -enfermedad cardiaca -antecedente de embarazo tubrico o ectpico -problemas del sistema inmunolgico -DIU colocado -enfermedad heptica o tumor del hgado -problemas con la coagulacin o si toma diluyentes sanguneos -[place] medicamentos intravenoso   Patient given a handout on fetal demise from [employer]. -forma inusual del tero -sangrado vaginal que no tiene explicacin -una reaccin alrgica o inusual al levonorgestrel, a otras hormonas, a la silicona o polietilenos, a otros medicamentos, alimentos, colorantes o conservantes -si est embarazada o buscando quedar embarazada -si est amamantando a un beb Cmo debo utilizar este medicamento? Un profesional de Naval architect este dispositivo en el tero. Hable con su pediatra para informarse acerca del uso de este medicamento en nios. Puede requerir atencin especial. Sobredosis: Pngase en contacto inmediatamente con un centro toxicolgico o una sala de urgencia si usted cree que haya tomado demasiado medicamento. ATENCIN: [employer] es solo para usted. No comparta este medicamento con nadie. Qu sucede si me olvido de una dosis? No se aplica en este caso. Qu puede  interactuar con este medicamento? No tome esta medicina con ninguno de los siguientes medicamentos: -amprenavir -bosentano -fosamprenavir Esta medicina tambin puede interactuar con los siguientes medicamentos: -aprepitant -barbitricos para producir el sueo o para el tratamiento de convulsiones -bexaroteno -griseofulvina -medicamentos para tratar los convulsiones, tales como [place], [place], [place], [place], [place], topiramato -modafinilo -pioglitazona -rifabutina -rifampicina -rifapentina -algunos medicamentos para tratar el virus VIH, tales como atazanavir, indinavir, lopinavir, nelfinavir, tipranavir, ritonavir -hierba de [place] -warfarina Puede ser que esta lista no menciona todas las posibles interacciones. Informe a su profesional de [employer] de [employer] productos a base de hierbas, medicamentos de [place] o suplementos nutritivos que est tomando. Si usted fuma, consume bebidas alcohlicas o si utiliza drogas ilegales, indqueselo tambin a su profesional de [employer]. Algunas sustancias pueden interactuar con su medicamento. A qu debo estar atento al usar [employer]? Visite a su mdico o a su profesional de la salud para chequear su evolucin peridicamente. Visite a su mdico si usted o su pareja tiene relaciones sexuales con [employer], se vuelve VIH positivo o contrae una enfermedad de transmisin sexual. Este medicamento no la protege de la infeccin por VIH (SIDA) ni de ninguna otra enfermedad de transmisin sexual. Puede controlar la ubicacin del DIU usted misma palpando con sus dedos limpios los hilos en la parte anterior de la vagina. No tire de los hilos. Es un buen hbito controlar la ubicacin del dispositivo despus de cada perodo menstrual. Si no slo siente los hilos sino que adems siente otra parte ms del DIU o si no puede sentir los hilos, consulte a su mdico inmediatamente. El DIU puede salirse por s solo. Puede quedar  embarazada si el dispositivo se sale de Nature conservation officer. Utilice un mtodo anticonceptivo adicional, como preservativos,  y consulte a su proveedor de atencin mdica s observa que el DIU se sali de Nature conservation officer. La utilizacin de tampones no cambia la posicin del DIU y no hay inconvenientes en usarlos durante su perodo. Qu efectos secundarios puedo tener al [employer] este medicamento? Efectos secundarios que debe informar a su mdico o a Producer, television/film/video de la salud tan pronto como sea posible: -Therapist, art como erupcin cutnea, picazn o urticarias, hinchazn de la cara, labios o lengua -fiebre, sntomas gripales -llagas genitales -alta presin sangunea -ausencia de un perodo menstrual durante 6 semanas mientras lo utiliza -Engineer, mining, Public librarian en las piernas -dolor o sensibilidad del plvico -dolor de cabeza repentino o severo -signos de Psychiatrist -calambres estomacales -falta de aliento repentina -problemas de coordinacin, del habla, al caminar -sangrado, flujo vaginal inusual -color amarillento de los ojos o la piel Efectos secundarios que, por lo general, no requieren atencin mdica (debe informarlos a su mdico o a su profesional de la salud si persisten o si son molestos): -acn -dolor de pecho -cambios en el deseo sexual o capacidad -cambios de peso -calambres, Research scientist (life sciences) o sensacin de [employer] se introduce el dispositivo -dolor de cabeza -sangrado menstruales irregulares en los primeros 3 a 6 meses de usar -nuseas Puede ser que esta lista no menciona todos los posibles efectos secundarios. Comunquese a su mdico por asesoramiento mdico [employer]. Usted puede informar los efectos secundarios a la FDA por telfono al 1-800-FDA-1088. Dnde debo guardar mi medicina? No se aplica en este caso. ATENCIN: Este folleto es un resumen. Puede ser que no cubra toda la posible informacin. Si usted tiene preguntas acerca de esta medicina, consulte con  su mdico, su farmacutico o su profesional de Radiographer, therapeutic.  2012, Elsevier/Gold Standard. (11/19/2008 3:17:12 PM)

## 2012-10-06 NOTE — Progress Notes (Signed)
Patient ID: Kimberly Wilkerson, female   DOB: 08/02/1981, 32 y.o.   MRN: [id number] Subjective:     Santrice Muzio is a 32 y.o. female who presents for a postpartum visit. She is 4 weeks postpartum following a spontaneous vaginal delivery for a fetal demiese. I have fully reviewed the prenatal and intrapartum course. The delivery was at 38 gestational weeks. Outcome: spontaneous vaginal delivery fetal demise at term.  Postpartum course has been uneventful.  Bowel function is normal. Bladder function is normal. Patient is not sexually active. Contraception method is none. Postpartum depression screening: negative.  The following portions of the patient's history were reviewed and updated as appropriate: allergies, current medications, past family history, past medical history, past social history, past surgical history and problem list.  Review of Systems Pertinent items are noted in HPI.   Objective:    BP 106/75  Pulse 91  Temp 97 F (36.1 C) (Oral)  Ht 4\' 9"  (1.448 m)  Wt 164 lb 12.8 oz (74.753 kg)  BMI 35.66 kg/m2  General:  alert and no distress     Lungs: clear to auscultation bilaterally  Heart:  regular rate and rhythm, S1, S2 normal, no murmur, click, rub or gallop  Abdomen: soft, non-tender; bowel sounds normal; no masses,  no organomegaly   Vulva:  normal  Vagina: normal vagina, no discharge, exudate, lesion, or erythema  Cervix:  no CMT  Corpus: normal size, contour, position, consistency, mobility, non-tender  Adnexa:  normal adnexa and no mass, fullness, tenderness  Rectal Exam: Not performed.        Assessment:    4 week postpartum exam H/o IUFD- ned serum eval for Anticardiolipin AB and Lupus.  H/u GDM- needs 2hr GTT  Plan:    1. Contraception: desires Mirena will get at the HD 2. H/u GDM will f/u for 2 hr GTT in 2 weeks  3. Follow up in: 2 weeks will get her IgG and IgM for Anticardiolipin and Lupus anticoagulant

## 2012-10-11 ENCOUNTER — Telehealth: Payer: Self-pay | Admitting: *Deleted

## 2012-10-11 NOTE — Telephone Encounter (Signed)
Message copied by Jill Side on Tue Oct 11, 2012  2:26 PM ------      Message from: Kimberly Wilkerson      Created: Tue Oct 11, 2012  6:40 AM       Pt was supposed to come in for 2hr post partum GLC test and labs for fetal demise. I do not see where she had these tests done.  Please call her and stress the importance of the testing and get her in for a lab visit. Need an interpreter.            Thx            clh-S

## 2012-10-20 ENCOUNTER — Other Ambulatory Visit: Payer: Medicaid Other

## 2012-10-26 ENCOUNTER — Other Ambulatory Visit: Payer: Self-pay

## 2012-10-26 DIAGNOSIS — O364XX0 Maternal care for intrauterine death, not applicable or unspecified: Secondary | ICD-10-CM

## 2012-10-26 DIAGNOSIS — O99815 Abnormal glucose complicating the puerperium: Secondary | ICD-10-CM

## 2012-10-27 LAB — LUPUS ANTICOAGULANT PANEL: PTT Lupus Anticoagulant: 28.7 secs (ref 28.0–43.0)

## 2012-10-27 LAB — GLUCOSE TOLERANCE, 2 HOURS W/ 1HR
Glucose, 2 hour: 364 mg/dL — ABNORMAL HIGH (ref 70–139)
Glucose, Fasting: 191 mg/dL — ABNORMAL HIGH (ref 70–99)

## 2012-10-28 LAB — CARDIOLIPIN ANTIBODIES, IGM+IGG
Anticardiolipin IgG: 5 GPL U/mL (ref <23)
Anticardiolipin IgM: 0 MPL U/mL (ref <11)

## 2012-11-02 ENCOUNTER — Telehealth: Payer: Self-pay | Admitting: *Deleted

## 2012-11-02 NOTE — Telephone Encounter (Signed)
Pt informed. Referral sent to [facility]. Forms mailed to patient for her to send back to [facility].

## 2012-11-02 NOTE — Telephone Encounter (Signed)
Message copied by Mannie Stabile on Wed Nov 02, 2012  1:25 PM ------      Message from: Willodean Rosenthal      Created: Sat Oct 29, 2012 12:46 PM       This patient has a markedly abnormal 2 hour glucose tol test.  She needs to be notified and referred to a primary care provider.            Thx,      clh-S ------

## 2012-11-15 ENCOUNTER — Ambulatory Visit (INDEPENDENT_AMBULATORY_CARE_PROVIDER_SITE_OTHER): Payer: Self-pay | Admitting: Family Medicine

## 2012-11-15 ENCOUNTER — Encounter: Payer: Self-pay | Admitting: Family Medicine

## 2012-11-15 VITALS — BP 110/68 | HR 73 | Temp 99.3°F | Ht <= 58 in | Wt 166.0 lb

## 2012-11-15 DIAGNOSIS — E119 Type 2 diabetes mellitus without complications: Secondary | ICD-10-CM

## 2012-11-15 MED ORDER — METFORMIN HCL 500 MG PO TABS: 500.0000 mg | ORAL_TABLET | Freq: Two times a day (BID) | ORAL | Status: DC

## 2012-11-15 NOTE — Progress Notes (Signed)
Patient ID: Kimberly Wilkerson, female   DOB: 06/02/1981, 32 y.o.   MRN: [id number] Subjective: The patient is a 32 y.o. year old female who presents today for new patient appointment. She is one primary concern.  1. Diabetes: Patient was diagnosed with gestational diabetes in her last pregnancy which, unfortunately, ended at 37 weeks with intrauterine fetal demise. Since then the patient has had a glucose tolerance test which was abnormal, given her diagnosis of diabetes. She is not currently taking any medications. She does not currently have a blood sugar meter. She has no insurance. She denies any polyuria, polydipsia, visual changes, chest pain, shortness of breath, diarrhea, or significant lower extremity swelling.  Patient's past medical, social, and family history were reviewed and updated as appropriate. History  Substance Use Topics  . Smoking status: Never Smoker   . Smokeless tobacco: Never Used  . Alcohol Use: No   Objective:  Filed Vitals:   11/15/12 1339  BP: 110/68  Pulse: 73  Temp: 99.3 F (37.4 C)   Gen: No acute distress, morbidly obese HEENT: Tympanic membranes normal bilaterally, mucous membranes moist CV: Regular rate and rhythm, no murmurs appreciated Resp: Clear to auscultation bilaterally Abdomen: Soft, nontender, nondistended, obese Ext: No edema, 2+ pulses  Assessment/Plan:  Please also see individual problems in problem list for problem-specific plans.

## 2012-11-15 NOTE — Patient Instructions (Signed)
It was great to see you today! Please start taking metformin 2 times per day, begin the process to get an orange card, and come back to see me in ~3 weeks.

## 2012-11-15 NOTE — Progress Notes (Signed)
Interpreter [place] for Dr Ritch 

## 2012-11-15 NOTE — Assessment & Plan Note (Signed)
The patient does have elevated blood sugars, none above the 2 to 300s. She is not currently symptomatic. I will start metformin at 500 mg twice a day and plan on seeing her back in 3-4 weeks to titrate this up. Patient has been instructed to begin the process to get an orange card and, once this is a range, we will obtain labs and give her prescription for her meter.

## 2013-02-02 ENCOUNTER — Ambulatory Visit: Payer: No Typology Code available for payment source | Attending: Family Medicine | Admitting: Family Medicine

## 2013-02-02 VITALS — BP 102/64 | HR 61 | Temp 98.6°F | Ht <= 58 in | Wt 168.0 lb

## 2013-02-02 DIAGNOSIS — E119 Type 2 diabetes mellitus without complications: Secondary | ICD-10-CM | POA: Insufficient documentation

## 2013-02-02 MED ORDER — METFORMIN HCL 500 MG PO TABS: 500.0000 mg | ORAL_TABLET | Freq: Two times a day (BID) | ORAL | Status: DC

## 2013-02-02 NOTE — Progress Notes (Signed)
Patient ID: Kimberly Wilkerson, female   DOB: January 15, 1981, 32 y.o.   MRN: [id number] CC: refill of metformin  Spanish Translator used to [employer] with patient  HPI: Pt from family medicine presenting for refills of her metformin.  She has already established care in the family medicine center but was directed to come here when she went to urgent care for evaluation.  Pt says that she is tolerating her metformin very well.  BS have been checked sporadically at home but no low BS reported.    No Known Allergies Past Medical History  Diagnosis Date  . Gestational diabetes   . Gonorrhea 2005  . Type 2 diabetes mellitus complicating pregnancy, antepartum 06/06/2012  . Fetal demise 09/11/2012   Current Outpatient Prescriptions on File Prior to Visit  Medication Sig Dispense Refill  . metFORMIN (GLUCOPHAGE) 500 MG tablet Take 1 tablet (500 mg total) by mouth 2 (two) times daily with a meal.  60 tablet  0   No current facility-administered medications on file prior to visit.   Family History  Problem Relation Age of Onset  . Diabetes Mellitus II Sister   . Diabetes Mellitus II Mother    History   Social History  . Marital Status: Single    Spouse Name: N/A    Number of Children: N/A  . Years of Education: N/A   Occupational History  . Not on file.   Social History Main Topics  . Smoking status: Never Smoker   . Smokeless tobacco: Never Used  . Alcohol Use: No  . Drug Use: No  . Sexually Active: Yes -- Female partner(s)    Birth Control/ Protection: None   Other Topics Concern  . Not on file   Social History Narrative   Lives with room mate, works at [employer]    Review of Systems  Constitutional: Negative for fever, chills, diaphoresis, activity change, appetite change and fatigue.  HENT: Negative for ear pain, nosebleeds, congestion, facial swelling, rhinorrhea, neck pain, neck stiffness and ear discharge.   Eyes: Negative for pain, discharge, redness, itching and  visual disturbance.  Respiratory: Negative for cough, choking, chest tightness, shortness of breath, wheezing and stridor.   Cardiovascular: Negative for chest pain, palpitations and leg swelling.  Gastrointestinal: Negative for abdominal distention.  Genitourinary: Negative for dysuria, urgency, frequency, hematuria, flank pain, decreased urine volume, difficulty urinating and dyspareunia.  Musculoskeletal: Negative for back pain, joint swelling, arthralgias and gait problem.  Neurological: Negative for dizziness, tremors, seizures, syncope, facial asymmetry, speech difficulty, weakness, light-headedness, numbness and headaches.  Hematological: Negative for adenopathy. Does not bruise/bleed easily.  Psychiatric/Behavioral: Negative for hallucinations, behavioral problems, confusion, dysphoric mood, decreased concentration and agitation.    Objective:   Filed Vitals:   02/02/13 1618  BP: 102/64  Pulse: 61  Temp: 98.6 F (37 C)    Physical Exam  Constitutional: Appears well-developed and well-nourished. No distress.  HENT: Normocephalic. External right and left ear normal. Oropharynx is clear and moist.  Eyes: Conjunctivae and EOM are normal. PERRLA, no scleral icterus.  Neck: Normal ROM. Neck supple. No JVD. No tracheal deviation. No thyromegaly.  CVS: RRR, S1/S2 +, no murmurs, no gallops, no carotid bruit.  Pulmonary: Effort and breath sounds normal, no stridor, rhonchi, wheezes, rales.  Abdominal: Soft. BS +,  no distension, tenderness, rebound or guarding.  Musculoskeletal: Normal range of motion. No edema and no tenderness.  Lymphadenopathy: No lymphadenopathy noted, cervical, inguinal. Neuro: Alert. Normal reflexes, muscle tone coordination. No cranial nerve deficit. Skin:  Skin is warm and dry. No rash noted. Not diaphoretic. No erythema. No pallor.  Psychiatric: Normal mood and affect. Behavior, judgment, thought content normal.   Lab Results  Component Value Date   WBC 9.3  09/09/2012   HGB 12.8 09/09/2012   HCT 36.4 09/09/2012   MCV 92.2 09/09/2012   PLT 182 09/09/2012   Lab Results  Component Value Date   CREATININE 0.39* 09/09/2012   BUN 12 09/09/2012   NA 133* 09/09/2012   K 4.2 09/09/2012   CL 100 09/09/2012   CO2 17* 09/09/2012    Lab Results  Component Value Date   HGBA1C 5.7* 09/09/2012        Assessment:   Patient Active Problem List   Diagnosis Date Noted  . Type II or unspecified type diabetes mellitus without mention of complication, not stated as uncontrolled 11/15/2012  . Obesity 08/15/2012        Plan:     Pt to return to family medicine for her regular medical care  Refilled her metformin for her today  She will get her diabetes labs done when she follows up with her PCP  Rodney Langton, MD, CDE, FAAFP [facility] [place], [place]

## 2013-02-02 NOTE — Patient Instructions (Addendum)
Diabetes y enfermedad periodontal (encas) (Diabetes and Periodontal Disease) La enfermedad periodontal es una afeccin que se localiza en la zona que rodea los dientes. Habitualmente es una infeccin bacteriana (producida por un germen) de larga duracin, que afecta las encas y los huesos que [employer]. Es muy probable que las personas que padecen diabetes y no son controladas, presenten enfermedad periodontal. La infeccin hace que la diabetes sea ms difcil de Chief Operating Officer. Si la diabetes no se trata, se incrementan los riesgos de sufrir complicaciones. La enfermedad periodontal (de las encas), incluyendo la gingivitis y la periodontitis, son infecciones graves que, si no se tratan, pueden conducir a la prdida de los dientes. La enfermedad periodontal puede afectar a uno o a varios dientes. Comienza cuando las bacterias de la placa que se forma sobre los dientes, hacen que las encas se inflamen.  La gingivitis en una forma leve de la enfermedad, en la que las encas enrojecen, se hinchan y sangran con facilidad. En general produce poca o ninguna molestia. A menudo es provocada por una higiene bucal deficiente (la limpieza de los [place], las encas y la boca) La gingivitis es reversible con tratamiento profesional y Physiological scientist correcta higiene bucal.  La gingivitis que no se trata puede avanzar hacia una periodontitis. Con el tiempo, la placa progresa y se extiende por debajo de la lnea de las encas. Las toxinas que producen las bacterias de la placa irritan las encas. Esto produce una respuesta inflamatoria de larga duracin. Significa que las encas enrojecen y duelen. Los tejidos y los [employer] sostienen los dientes pueden romperse y destruirse. Las encas se separan de los dientes y forman unas bolsas (espacios entre los dientes y las encas) que luego se infectan. A medida que la enfermedad progresa, esas bolsas se agrandan y se destruye cada vez ms tejido de las encas y de [place].  Generalmente, este proceso destructivo presenta sntomas muy leves. Finalmente, los dientes se aflojan y deben extraerse.  CAUSAS La causa principal de la enfermedad periodontal es la placa dental. La placa se quita cuando se higienizan los dientes. Otros factores que [employer] salud de las encas son:   El hbito de fumar y el tabaco incrementan el riesgo de enfermedad periodontal. El consumo de tabaco es uno de los factores de riesgo ms significativos en la enfermedad periodontal.  Verner Chol personas son susceptibles genticamente a la enfermedad de las encas. Esto significa que los rasgos de esta enfermedad se heredan de [place]. Una buena higiene y el cuidado de los dientes pueden prevenir los problemas que ya han sufrido sus [place].  Los cambios hormonales de la pubertad y Firefighter pueden afectar muchos de estos tejidos del organismo, incluyendo las encas. Las encas pueden sensibilizarse y por Engineer, water con intensidad. Esto hace que usted sea ms susceptible a la enfermedad de las encas. Las mujeres embarazadas que presentan enfermedad de las encas son ms propensas a Airline pilot y bebs de bajo peso al Psychologist, clinical.  El estrs es un factor de riesgo en la enfermedad periodontal. Las investigaciones han demostrado que el estrs puede hacer que el organismo tenga ms dificultades para Production manager contra las infecciones, entre las que se incluyen la enfermedad periodontal.  La diabetes es una enfermedad que altera los niveles de glucosa en la [place]. La diabetes se desarrolla, ya sea a partir de una insuficiencia en la produccin de insulina (la hormona que es el componente clave en la capacidad del organismo de Chemical engineer la  glucosa de la sangre) o de la incapacidad del organismo para Chemical engineer la glucosa correctamente. Si usted es diabtico, usted tiene un riesgo mayor de presentar infecciones, incluyendo la enfermedad periodontal. Estas infecciones hacen que los niveles de  glucosa en la sangre sean ms difciles de Chief Operating Officer.  El apretar o Merchant navy officer los dientes hace que se agregue fuerza extra a los tejidos que [employer], y esto puede Camera operator velocidad en que estos tejidos periodontales se destruyen.  Una nutricin deficiente puede empeorar la enfermedad de las encas.  Las enfermedades que interfieren en el sistema inmunolgico del organismo pueden empeorar el trastorno de las encas. Algunos ejemplos son Payton Mccallum VIH y las personas que han recibido un transplante y que toman medicamentos para Doctor, hospital. LAS DIFERENTES ETAPAS DE LA ENFERMEDAD PERIODONTAL  La gingivitis es la forma ms leve de la enfermedad periodontal. Esta hace que las encas enrojezcan, se hinchen y sangren con facilidad. Generalmente en esta etapa existen pocas o ninguna molestia. La gingivitis es reversible con tratamiento profesional y [place] correcta higiene bucal.  La periodontitis agresiva se produce en pacientes que no presentan otros trastornos clnicos. Las caractersticas comunes incluyen la prdida rpida de la adherencia, destruccin sea y sucede en familias enteras.  La periodontitis crnica es una forma de enfermedad crnica que produce inflamacin en los tejidos que [employer], una prdida progresiva de la adherencia y prdida sea, y se caracteriza por la formacin de bolsas y /o el retroceso gingival (de las encas). Se la reconoce como la forma ms frecuente de periodontitis. Es ms comn en el [place], pero puede presentarse a Actuary. La prdida de la adherencia generalmente se produce lentamente, pero puede haber perodos de rpida progresin.  La periodontitis puede observarse a edades tempranas, asociada a alguna de las [employer], como la diabetes.  La enfermedad periodontal necrotizante es una infeccin en la que los tejidos gingivales, los ligamentos periodontales y los huesos alveolares se destruyen. Se observa con ms  frecuencia en las personas que padecen VIH, en los casos de malnutricin (dieta deficiente) e inmunosupresin (el sistema inmunolgico no funciona bien) TRATAMIENTO  El mejor tratamiento consiste en una correcta higiene bucal.  El odontlogo puede ayudarlo a decidir la mejor forma de Research scientist (life sciences).  [place] sus dientes y psese el hilo dental todos los [place].  Si una higiene regular y ms intensiva no mejora el trastorno, puede requerir la consulta con Music therapist.  A veces la ciruga se vuelve una forma necesaria de [place].  Visite al odontlogo con regularidad, tal como se le indic. Document Released: 09/07/2005 Document Revised: 03/08/2012 [facility] Patient Information 2013 [place], [place]. Diabetes: el cuidado de los pies  (Diabetes and Foot Care) La diabetes puede ser la causa de que el flujo sanguneo (circulacin) en las piernas y los pies sea deficiente. Debido a esto, la piel se torna ms delgada, se rompe con facilidad y se cura ms lentamente. Usted tambin puede sufrir un dao nervioso en las piernas y en los pies debido a la disminucin de la sensibilidad. Es posible que no advierta las heridas ms pequeas que pueden conducir a problemas o infecciones graves. Cuidarse los pies es una de las cosas ms importantes que puede hacer por usted.  INSTRUCCIONES PARA EL CUIDADO DOMICILIARIO  No camine con los pies descalzos. De ese modo podr lastimarse con mayor facilidad.  Controle sus pies diariamente para observar ampollas, cortes y enrojecimiento.  Lave sus pies con agua tibia (no caliente) y un  jabn suave. Seque bien sus pies, especialmente entre los dedos, dando [place].  Aplique una locin humectante, Vaseline o Vaseline Intensive Care, sobre la piel seca o sobre las uas quebradizas. No lo aplique entre los dedos.  Recorte las uas en forma recta. No escarbe debajo de las uas o alrededor [employer].  No corte las durezas o callosidades, ni trate de quitarlas  con medicamentos.  Use calcetines de algodn o medias [employer] [place] [employer]. Asegrese de que no le [employer] Inc. No utilice medias altas hasta las rodillas porque podran disminuir el flujo sanguneo hacia las piernas.  Use zapatos de cuero que le queden bien y que sean acolchados. Para amoldar los zapatos, selos slo algunas horas por da para evitar que los pies se lastimen.  Utilice zapatos todo [employer], incluso en casa.  No cruce las piernas. Esto puede disminuir el flujo [employer].  Si algo le ha raspado, cortado o lastimado la piel de los pies, mantenga la piel de esa zona limpia y [place]. Estas reas pueden limpiarse con un antisptico suave, como jabn suave o Betadine. No utilice perxido, alcohol, yodo ni [place]iol[place].  Cuando se quite un vendaje adhesivo, asegrese de no daar la piel.  Observe la herida varias veces por da para asegurarse de que se est curando.  No use bolsas de agua caliente, ni almohadillas trmicas. Pueden ocurrir [employer]. Si siente falta de sensibilidad en lo pies o piernas, podr no darse cuenta de que eso sucede hasta que sea demasiado tarde.  Informe al mdico acerca de cortes, llagas o hematomas. No espere SOLICITE ATENCIN MDICA SI:  La lesin no se cura, o nota enrojecimiento, adormecimiento, quemazn u hormigueos.  Siente los pies siempre fros.  Siente dolor o calambres en las piernas o los pies. SOLICITE ATENCIN MDICA DE INMEDIATO SI:  Presenta enrojecimiento, hinchazn o aumento del dolor en la herida.  Una lnea roja sube por pierna.  Aparece pus en la herida.  La temperatura oral se eleva sin motivo por encima de 102 F (38.9 C) o segn le indique el profesional que lo asiste.  Advierte un olor ftido que proviene de la herida o del vendaje. EST SEGURO QUE:   Comprende las instrucciones para el alta mdica.  Controlar su enfermedad.  Solicitar atencin mdica de inmediato segn las  indicaciones. Document Released: 09/07/2005 Document Revised: 11/30/2011 [facility] Patient Information 2013 [place], [place]. Diabetes y Doroteo [place]man fsica (Diabetes and Exercise) La actividad fsica regular es muy importante y [place] a:   Chief Operating Officer el nivel de glucosa en sangre (azcar).  Disminuir la presin arterial.  Controlar el colesterol en sangre (colesterol y triglicridos).  Mejorar el estado de salud general. BENEFICIOS DE LA ACTIVIDAD FSICA  Mejora el buen estado fsico.  Mejora la flexibilidad.  Aumenta la resistencia.  Aumenta la densidad sea.  Favorece el control del peso.  Aumenta la fuerza muscular.  Disminuye la Art gallery manager.  Mejora la utilizacin de la insulina por parte del organismo.  Aumenta la sensibilidad a la insulina.  Reduce las necesidades de insulina.  Har que se sienta mejor.  Reduce el estrs y las tensiones. Las personas diabticas que incorporan la actividad fsica a su estilo de vida obtienen beneficios adicionales.  Prdida de peso.  Reduccin del apetito.  Mejora la utilizacin de la glucosa por parte del organismo.  Disminuye los factores de riesgo para las enfermedades cardacas.  Disminuye el colesterol y los triglicridos.  Eleva el nivel de colesterol bueno (lipoproteinas de  alta densidad HDL).  Disminuye el nivel de Banker.  Disminuye la presin arterial. DIABETES TIPO I Y ACTIVIDAD FSICA  La actividad fsica disminuir el nivel de glucosa en [place].  Si el nivel de glucosa en sangre es de ms de 240 mg/dl, controle las cetonas en la [place]. Si hay cetonas, no realice actividad fisica.  El sitio de inyeccin de la insulina puede requerir un ajuste cuando se realiza actividad fsica. Evite inyectarse insulina en las zonas del cuerpo que ejercitar. Por ejemplo, evite inyectarse insulina en:  Los brazos, si juega al tenis.  Las piernas, si corre. Para obtener ms informacion, consulte a su  mdico.  Lleve un registro de:  La ingesta de alimentos.  El tipo y cantidad de [place].  Los momentos esperables de picos de accin de la insulina.  Los niveles de Event organiser. Hgalo antes, durante y despus de Printmaker actividad fsica. Verifique los registros junto con su mdico. Esto ser de utilidad para la confeccin de pautas para ajustar la ingesta de alimentos o las cantidades de [place].  DIABETES TIPO 2 Y ACTIVIDAD FSICA  La actividad fsica regular ayuda a controlar el nivel de glucosa en [place].  La actividad fsica es importante porque:  Aumenta la sensibilidad del organismo a la insulina.  Mejora el control del nivel de Event organiser.  Reduce el riesgo de enfermedades cardiacas. Disminuye el colesterol srico y los triglicridos. Disminuye la presin arterial.  Las personas que reciben insulina o agentes hipoglucemiantes por va oral deben controlar la aparicin de signos de hipoglucemia (mareos, temblores, sudoracin, escalofros y confusin).  Durante la actividad fsica se pierde Data processing manager. Esta prdida de lquidos debe reponerse. De este modo se evita la prdida de lquidos corporales (deshidratacin) y el golpe de Airline pilot. Comente con su mdico antes de comenzar un programa de actividad fsica para verificar que sea seguro para usted. Recuerde, cualquier actividad es mejor que ninguna.  Document Released: 09/27/2007 Document Revised: 11/30/2011 [facility] Patient Information 2013 [place], [place].

## 2013-02-28 ENCOUNTER — Ambulatory Visit (INDEPENDENT_AMBULATORY_CARE_PROVIDER_SITE_OTHER): Payer: No Typology Code available for payment source | Admitting: Family Medicine

## 2013-02-28 VITALS — BP 104/52 | HR 75 | Temp 99.3°F | Ht 59.0 in | Wt 166.0 lb

## 2013-02-28 DIAGNOSIS — E119 Type 2 diabetes mellitus without complications: Secondary | ICD-10-CM

## 2013-02-28 MED ORDER — GLIMEPIRIDE 2 MG PO TABS: 2.0000 mg | ORAL_TABLET | Freq: Every day | ORAL | Status: DC

## 2013-02-28 MED ORDER — METFORMIN HCL 1000 MG PO TABS: 1000.0000 mg | ORAL_TABLET | Freq: Two times a day (BID) | ORAL | Status: DC

## 2013-02-28 NOTE — Patient Instructions (Addendum)
I want you to increase sure metformin dose to 1000 mg twice a day. I am also adding the medicine Amaryl. You will take this in the morning with breakfast.  If you have any problems with either of these medicines, please get back in to see [place]. Otherwise, plan on coming back in 3 months.  Quiero que aumentar la dosis de metformina Asegrese de 1.000 mg [employer].  Tambin estoy aadiendo el medicamento Amaryl. Tendr que tomar esto en la maana con el desayuno.   Si usted tiene algn problema con cualquiera de estos medicamentos, por favor volver a vernos. De lo contrario, pensado regresar en 3 meses.

## 2013-02-28 NOTE — Assessment & Plan Note (Signed)
Increase metformin to 1000 mg twice daily and add low-dose Amaryl. If patient tolerates well we'll plan to see back in 3 months for recheck A1c.

## 2013-02-28 NOTE — Progress Notes (Signed)
Patient ID: Sharica Roedel, female   DOB: Jan 05, 1981, 32 y.o.   MRN: [id number] Subjective: The patient is a 32 y.o. year old female who presents today for diabetes followup.  1. Diabetes: Patient continues to take 500 mg of metformin twice daily. A1c was obtained today and was 8.2. Patient reports she is tolerating her medication well with no problems with diarrhea or nausea. She denies any significant lower remedy swelling. She checks her blood sugars sporadically and reports no low blood sugars.  Patient's past medical, social, and family history were reviewed and updated as appropriate. History  Substance Use Topics  . Smoking status: Never Smoker   . Smokeless tobacco: Never Used  . Alcohol Use: No   Objective:  Filed Vitals:   02/28/13 1441  BP: 104/52  Pulse: 75  Temp: 99.3 F (37.4 C)   Gen: No acute distress, morbidly obese  CV: regular rate and rhythm, no murmurs appreciated Resp: Clear to auscultation bilaterally Ext: No edema, 2+ pulses  Assessment/Plan:  Please also see individual problems in problem list for problem-specific plans.

## 2013-06-01 ENCOUNTER — Ambulatory Visit: Payer: No Typology Code available for payment source | Attending: Internal Medicine | Admitting: Internal Medicine

## 2013-06-01 VITALS — BP 118/83 | HR 73 | Temp 99.0°F | Resp 16 | Ht <= 58 in | Wt 165.0 lb

## 2013-06-01 DIAGNOSIS — E119 Type 2 diabetes mellitus without complications: Secondary | ICD-10-CM

## 2013-06-01 MED ORDER — GLIMEPIRIDE 2 MG PO TABS: 2.0000 mg | ORAL_TABLET | Freq: Every day | ORAL | Status: DC

## 2013-06-01 MED ORDER — METFORMIN HCL 1000 MG PO TABS: 1000.0000 mg | ORAL_TABLET | Freq: Two times a day (BID) | ORAL | Status: DC

## 2013-06-01 NOTE — Progress Notes (Signed)
Patient ID: Kimberly Wilkerson, female   DOB: 04-07-1981, 32 y.o.   MRN: [id number]  CC: follow up      HPI:  Pt is 32 yo female with diabetes who comes to clinic for follow up on diabetes management. She speaks no english so phone interpreter was used. Pt reports feeling well, she explains that 3 months ago she was started on Metformin for diabetes and she reports compliance with medication until last month. She denies fevers, chills, abdominal or urinary concerns. She denies recent sicknesses or hospitalizations. No other systemic concerns. Pt explains she has ran out of the medication one month ago and has not taken it since. She needs refill on the medication.    No Known Allergies Past Medical History  Diagnosis Date  . Gestational diabetes   . Gonorrhea 2005  . Type 2 diabetes mellitus complicating pregnancy, antepartum 06/06/2012  . Fetal demise 09/11/2012   Current Outpatient Prescriptions on File Prior to Visit  Medication Sig Dispense Refill  . glimepiride (AMARYL) 2 MG tablet Take 1 tablet (2 mg total) by mouth daily before breakfast.  30 tablet  3  . metFORMIN (GLUCOPHAGE) 1000 MG tablet Take 1 tablet (1,000 mg total) by mouth 2 (two) times daily with a meal.  60 tablet  3   No current facility-administered medications on file prior to visit.   Family History  Problem Relation Age of Onset  . Diabetes Mellitus II Sister   . Diabetes Mellitus II Mother    History   Social History  . Marital Status: Single    Spouse Name: N/A    Number of Children: N/A  . Years of Education: N/A   Occupational History  . Not on file.   Social History Main Topics  . Smoking status: Never Smoker   . Smokeless tobacco: Never Used  . Alcohol Use: No  . Drug Use: No  . Sexual Activity: Yes    Partners: Male    Birth Control/ Protection: None   Other Topics Concern  . Not on file   Social History Narrative   Lives with room mate, works at [employer]    Review of Systems   Constitutional: Negative for fever, chills, diaphoresis, activity change, appetite change and fatigue.  HENT: Negative for ear pain, nosebleeds, congestion, facial swelling, rhinorrhea, neck pain, neck stiffness and ear discharge.   Eyes: Negative for pain, discharge, redness, itching and visual disturbance.  Respiratory: Negative for cough, choking, chest tightness, shortness of breath, wheezing and stridor.   Cardiovascular: Negative for chest pain, palpitations and leg swelling.  Gastrointestinal: Negative for abdominal distention.  Genitourinary: Negative for dysuria, urgency, frequency, hematuria, flank pain, decreased urine volume, difficulty urinating and dyspareunia.  Musculoskeletal: Negative for back pain, joint swelling, arthralgias and gait problem.  Neurological: Negative for dizziness, tremors, seizures, syncope, facial asymmetry, speech difficulty, weakness, light-headedness, numbness and headaches.  Hematological: Negative for adenopathy. Does not bruise/bleed easily.  Psychiatric/Behavioral: Negative for hallucinations, behavioral problems, confusion, dysphoric mood, decreased concentration and agitation.    Objective:  There were no vitals filed for this visit.  Physical Exam  Constitutional: Appears well-developed and well-nourished. No distress.  HENT: Normocephalic. External right and left ear normal. Oropharynx is clear and moist.  Eyes: Conjunctivae and EOM are normal. PERRLA, no scleral icterus.  Neck: Normal ROM. Neck supple. No JVD. No tracheal deviation. No thyromegaly.  CVS: RRR, S1/S2 +, no murmurs, no gallops, no carotid bruit.  Pulmonary: Effort and breath sounds normal, no stridor, rhonchi,  wheezes, rales.  Abdominal: Soft. BS +,  no distension, tenderness, rebound or guarding.  Musculoskeletal: Normal range of motion. No edema and no tenderness.  Lymphadenopathy: No lymphadenopathy noted, cervical, inguinal. Neuro: Alert. Normal reflexes, muscle tone  coordination. No cranial nerve deficit. Skin: Skin is warm and dry. No rash noted. Not diaphoretic. No erythema. No pallor.  Psychiatric: Normal mood and affect. Behavior, judgment, thought content normal.   Lab Results  Component Value Date   WBC 9.3 09/09/2012   HGB 12.8 09/09/2012   HCT 36.4 09/09/2012   MCV 92.2 09/09/2012   PLT 182 09/09/2012   Lab Results  Component Value Date   CREATININE 0.39* 09/09/2012   BUN 12 09/09/2012   NA 133* 09/09/2012   K 4.2 09/09/2012   CL 100 09/09/2012   CO2 17* 09/09/2012    Lab Results  Component Value Date   HGBA1C 8.2 02/28/2013   Lipid Panel  No results found for this basename: chol, trig, hdl, cholhdl, vldl, ldlcalc       Assessment and plan:   Patient Active Problem List   Diagnosis Date Noted  . Type II or unspecified type diabetes mellitus without mention of complication, not stated as uncontrolled - this patient has not taken medication for one month and A1c is 9 I will provide refills on both medicines. I have advised her to continue taking medicines as prescribed, enough refills have been prescribed. Since patient cannot afford glucose monitor at this time I have agreed to continue current medical regimen. We'll plan on repeating A1c in 3 months and if A1c is higher than 9 and we'll consider initiating insulin regimen and will have to make sure that she monitors her sugar levels at home regularly. We had discussed diet and exercise in detail.  11/15/2012  . Obesity - diet recommendations provided  08/15/2012

## 2013-06-01 NOTE — Patient Instructions (Addendum)
Diabetes y actividad fsica (Diabetes and Exercise) La actividad fsica regular es muy importante y ayuda a:   Controlar el nivel de glucosa en sangre (azcar).  Disminuir la presin arterial.  Controlar el colesterol en sangre (colesterol y triglicridos).  Mejorar el estado de salud general. BENEFICIOS DE LA ACTIVIDAD FSICA  Mejora el buen estado fsico.  Mejora la flexibilidad.  Aumenta la resistencia.  Aumenta la densidad sea.  Favorece el control del peso.  Aumenta la fuerza muscular.  Disminuye la grasa corporal.  Mejora la utilizacin de la insulina por parte del organismo.  Aumenta la sensibilidad a la insulina.  Reduce las necesidades de insulina.  Har que se sienta mejor.  Reduce el estrs y las tensiones. Las personas diabticas que incorporan la actividad fsica a su estilo de vida obtienen beneficios adicionales.  Prdida de peso.  Reduccin del apetito.  Mejora la utilizacin de la glucosa por parte del organismo.  Disminuye los factores de riesgo para las enfermedades cardacas.  Disminuye el colesterol y los triglicridos.  Eleva el nivel de colesterol bueno (lipoproteinas de alta densidad HDL).  Disminuye el nivel de azcar en la sangre.  Disminuye la presin arterial. DIABETES TIPO I Y ACTIVIDAD FSICA  La actividad fsica disminuir el nivel de glucosa en sangre.  Si el nivel de glucosa en sangre es de ms de 240 mg/dl, controle las cetonas en la orina. Si hay cetonas, no realice actividad fisica.  El sitio de inyeccin de la insulina puede requerir un ajuste cuando se realiza actividad fsica. Evite inyectarse insulina en las zonas del cuerpo que ejercitar. Por ejemplo, evite inyectarse insulina en:  Los brazos, si juega al tenis.  Las piernas, si corre. Para obtener ms informacion, consulte a su mdico.  Lleve un registro de:  La ingesta de alimentos.  El tipo y cantidad de actividad fsica.  Los momentos esperables de  picos de accin de la insulina.  Los niveles de glucosa en sangre. Hgalo antes, durante y despus de realizar la actividad fsica. Verifique los registros junto con su mdico. Esto ser de utilidad para la confeccin de pautas para ajustar la ingesta de alimentos o las cantidades de insulina.  DIABETES TIPO 2 Y ACTIVIDAD FSICA  La actividad fsica regular ayuda a controlar el nivel de glucosa en sangre.  La actividad fsica es importante porque:  Aumenta la sensibilidad del organismo a la insulina.  Mejora el control del nivel de glucosa en sangre.  Reduce el riesgo de enfermedades cardiacas. Disminuye el colesterol srico y los triglicridos. Disminuye la presin arterial.  Las personas que reciben insulina o agentes hipoglucemiantes por va oral deben controlar la aparicin de signos de hipoglucemia (mareos, temblores, sudoracin, escalofros y confusin).  Durante la actividad fsica se pierde agua corporal. Esta prdida de lquidos debe reponerse. De este modo se evita la prdida de lquidos corporales (deshidratacin) y el golpe de calor. Comente con su mdico antes de comenzar un programa de actividad fsica para verificar que sea seguro para usted. Recuerde, cualquier actividad es mejor que ninguna.  Document Released: 09/27/2007 Document Revised: 11/30/2011 [employer] Patient Information 2014 [employer].  

## 2013-06-02 LAB — BASIC METABOLIC PANEL
CO2: 26 mEq/L (ref 19–32)
Glucose, Bld: 252 mg/dL — ABNORMAL HIGH (ref 70–99)
Potassium: 4.1 mEq/L (ref 3.5–5.3)
Sodium: 131 mEq/L — ABNORMAL LOW (ref 135–145)

## 2013-06-02 LAB — LIPID PANEL
Total CHOL/HDL Ratio: 4.6 Ratio
VLDL: 62 mg/dL — ABNORMAL HIGH (ref 0–40)

## 2013-08-31 ENCOUNTER — Ambulatory Visit: Payer: Self-pay

## 2013-09-25 IMAGING — US US OB DETAIL+14 WK
2 series · 12 of 28 positions shown · non-contrast
Comparison: none

[Series 1: us ob detail +14 wk · 11 of 79 slices shown (1 of 2)]
[im 4/79]
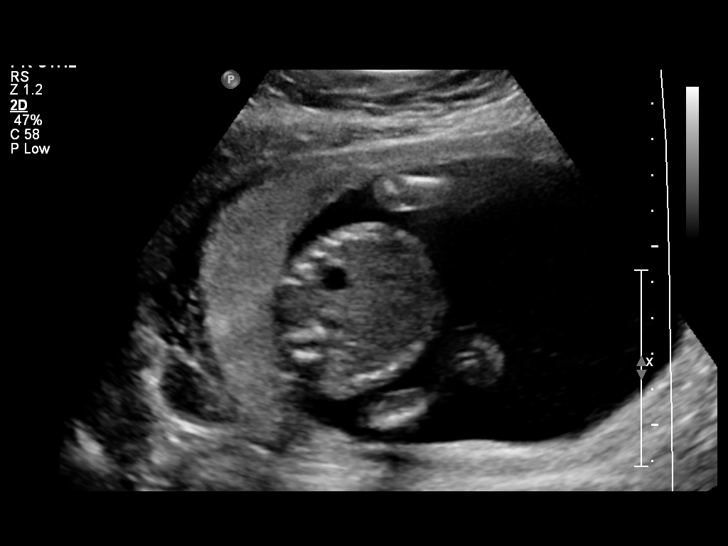
[im 10/79]
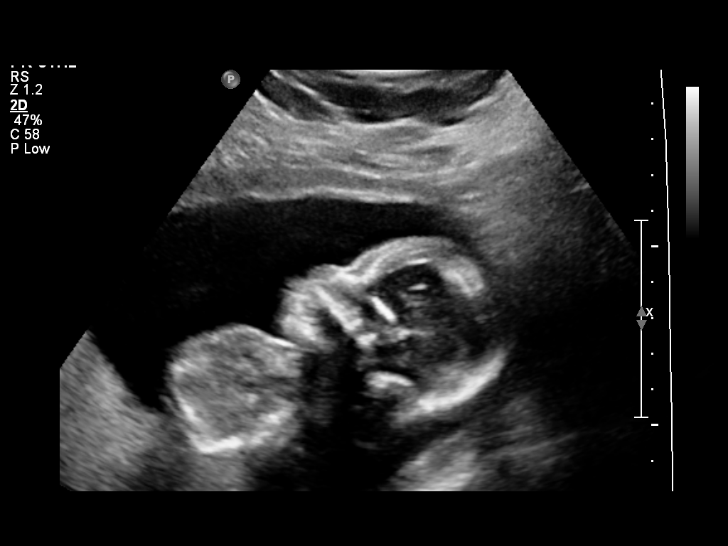
[im 17/79]
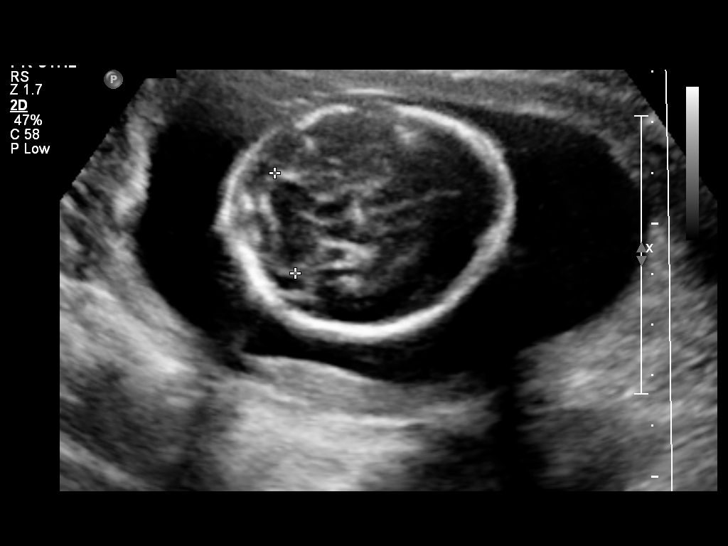
[im 27/79]
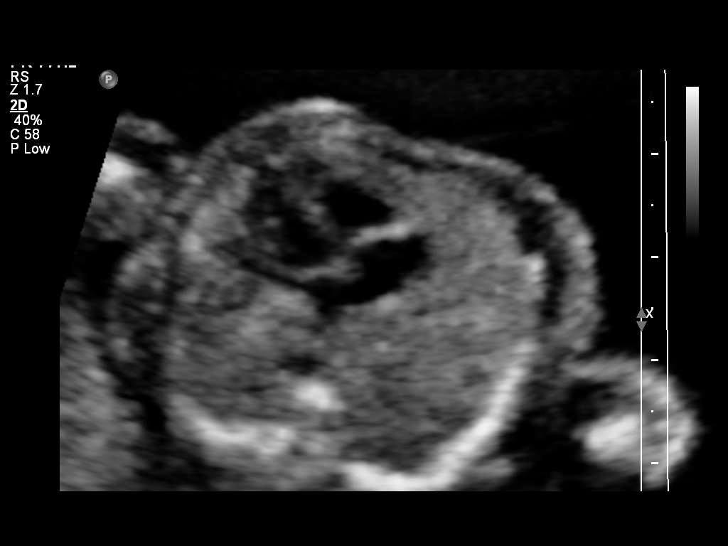
[im 33/79]
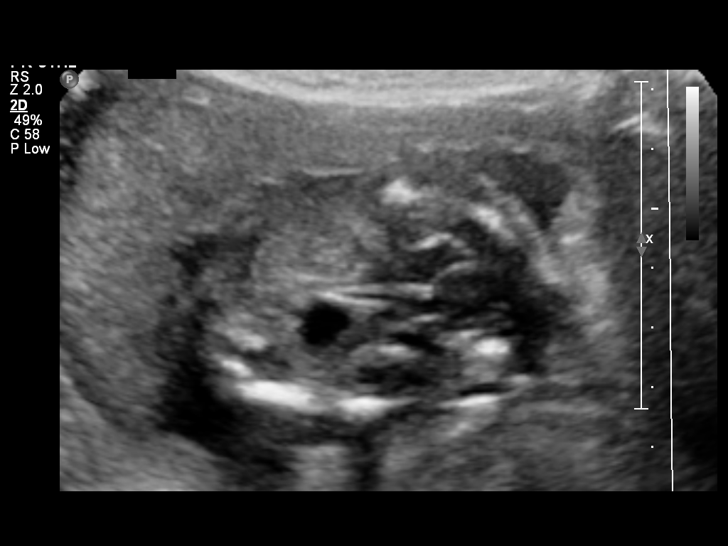
[im 40/79]
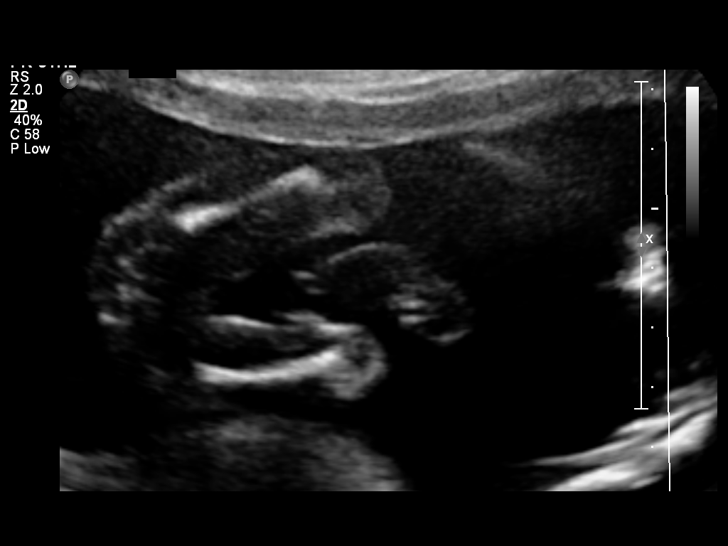
[im 49/79]
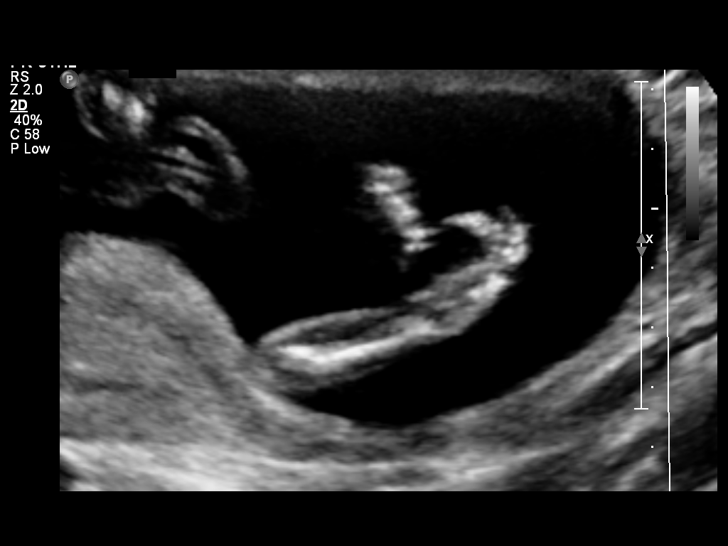
[im 56/79]
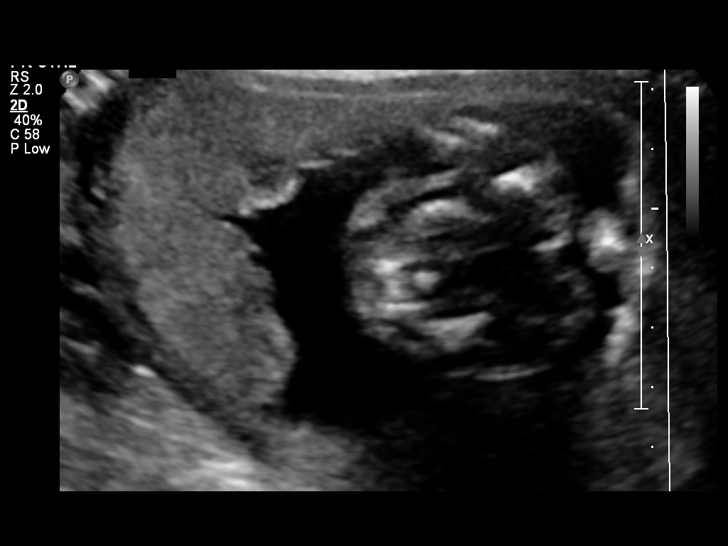
[im 62/79]
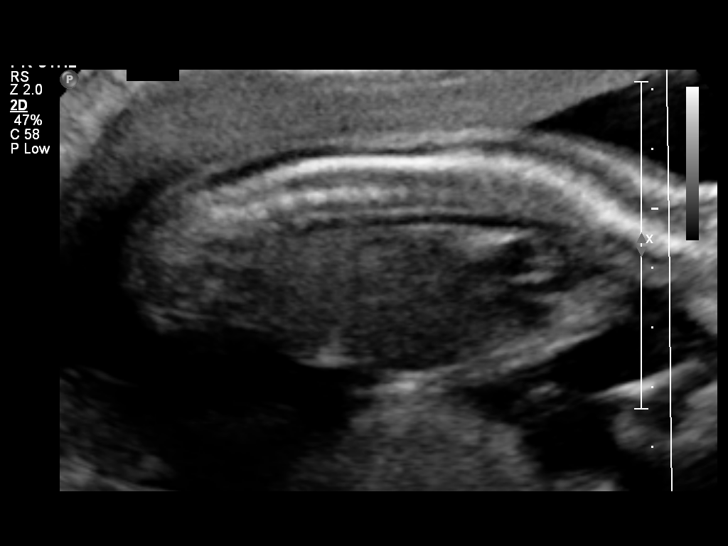
[im 72/79]
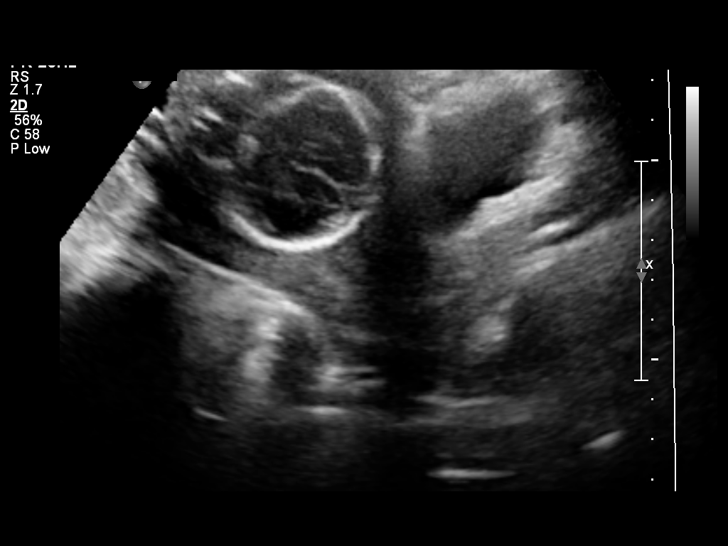
[im 79/79]
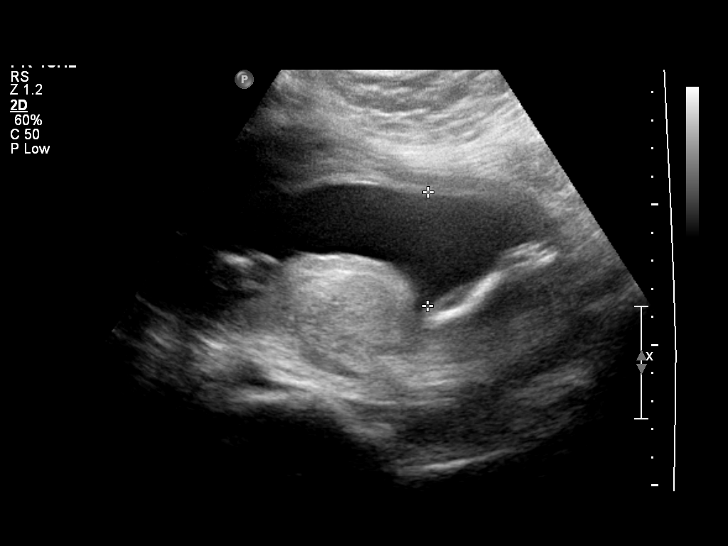

[Series 1: us ob detail +14 wk · 1 of 10 slices shown (2 of 2)]
[im 5/10]
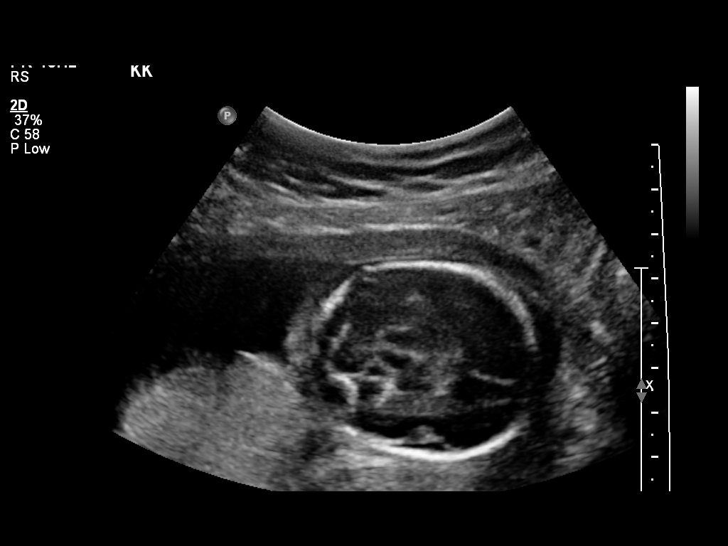

[12 of 28 positions shown; findings below may reference images not displayed]

OBSTETRICS REPORT
                      (Signed Final 06/08/2012 [DATE])

Service(s) Provided

 US OB DETAIL + 14 WK                                  76811.0
Indications

 Detailed fetal anatomic survey
 Diabetes - Gestational
 Obesity
Fetal Evaluation

 Num Of Fetuses:    1
 Fetal Heart Rate:  147                         bpm
 Cardiac Activity:  Observed
 Presentation:      Cephalic
 Placenta:          Right lateral, above
                    cervical os
 P. Cord            Visualized
 Insertion:

 Amniotic Fluid
 AFI FV:      Subjectively within normal limits
                                             Larg Pckt:     4.0  cm
Biometry

 BPD:       45  mm    G. Age:   19w 4d                CI:        72.28   70 - 86
                                                      FL/HC:      16.3   16.1 -

 HC:     168.4  mm    G. Age:   19w 4d       52  %    HC/AC:      1.19   1.09 -

 AC:       141  mm    G. Age:   19w 4d       52  %    FL/BPD:
 FL:      27.4  mm    G. Age:   18w 3d       15  %    FL/AC:      19.4   20 - 24
 HUM:     25.6  mm    G. Age:   18w 0d       16  %
 CER:     20.2  mm    G. Age:   19w 1d       49  %
 NFT:     3.46  mm

 Est. FW:     270  gm    0 lb 10 oz      41  %
Gestational Age

 LMP:           19w 2d       Date:   01/25/12                 EDD:   10/31/12
 U/S Today:     19w 2d                                        EDD:   10/31/12
 Best:          19w 2d    Det. By:   LMP  (01/25/12)          EDD:   10/31/12
Anatomy

 Cranium:          Appears normal         Aortic Arch:      Appears normal
 Fetal Cavum:      Appears normal         Ductal Arch:      Appears normal
 Ventricles:       Appears normal         Diaphragm:        Appears normal
 Choroid Plexus:   Appears normal         Stomach:          Appears normal
 Cerebellum:       Appears normal         Abdomen:          Appears normal
 Posterior Fossa:  Appears normal         Abdominal Wall:   Appears nml (cord
                                                            insert, abd wall)
 Nuchal Fold:      Appears normal         Cord Vessels:     Appears normal (3
                                                            vessel cord)
 Face:             Profile appears        Kidneys:          Appear normal
                   normal
 Lips:             Appears normal         Bladder:          Appears normal
 Heart:            Appears normal         Spine:            Appears normal
                   (4CH, axis, and
                   situs)
 RVOT:             Appears normal         Lower             Appears normal
                                          Extremities:
 LVOT:             Appears normal         Upper             Appears normal
                                          Extremities:

 Other:  Female gender. Heels and 5th digit visualized.  Nasal bone visualized.
Cervix Uterus Adnexa

 Cervical Length:   3.05      cm

 Cervix:       Normal appearance by transabdominal scan.

 Left Ovary:   Not visualized.
 Right Ovary:  Within normal limits.
 Adnexa:     No abnormality visualized.
Impression

 Single living IUP.  US EGA is concordant with LMP.
 No fetal anomalies seen involving visualized anatomy.
 No sonographic markers for aneuploidy visualized.

## 2014-02-27 ENCOUNTER — Ambulatory Visit: Payer: No Typology Code available for payment source | Attending: Internal Medicine

## 2014-03-26 ENCOUNTER — Encounter: Payer: Self-pay | Admitting: Internal Medicine

## 2014-03-26 ENCOUNTER — Ambulatory Visit: Payer: No Typology Code available for payment source | Attending: Internal Medicine | Admitting: Internal Medicine

## 2014-03-26 VITALS — BP 98/62 | HR 85 | Temp 98.3°F | Resp 16 | Ht <= 58 in | Wt 162.8 lb

## 2014-03-26 DIAGNOSIS — E785 Hyperlipidemia, unspecified: Secondary | ICD-10-CM | POA: Insufficient documentation

## 2014-03-26 DIAGNOSIS — E669 Obesity, unspecified: Secondary | ICD-10-CM | POA: Insufficient documentation

## 2014-03-26 DIAGNOSIS — E119 Type 2 diabetes mellitus without complications: Secondary | ICD-10-CM | POA: Insufficient documentation

## 2014-03-26 LAB — GLUCOSE, POCT (MANUAL RESULT ENTRY)
POC Glucose: 281 mg/dl — AB (ref 70–99)
POC Glucose: 216 mg/dl — AB (ref 70–99)

## 2014-03-26 LAB — POCT GLYCOSYLATED HEMOGLOBIN (HGB A1C): Hemoglobin A1C: 8.3

## 2014-03-26 MED ORDER — FREESTYLE SYSTEM KIT: 1.0000 | PACK | Status: DC | PRN

## 2014-03-26 MED ORDER — GLIMEPIRIDE 2 MG PO TABS
2.0000 mg | ORAL_TABLET | Freq: Every day | ORAL | Status: DC
Start: 2014-03-26 — End: 2014-06-22

## 2014-03-26 MED ORDER — FREESTYLE LANCETS MISC: Status: DC

## 2014-03-26 MED ORDER — METFORMIN HCL 1000 MG PO TABS: 1000.0000 mg | ORAL_TABLET | Freq: Two times a day (BID) | ORAL | Status: DC

## 2014-03-26 MED ORDER — INSULIN ASPART 100 UNIT/ML ~~LOC~~ SOLN
10.0000 [IU] | Freq: Once | SUBCUTANEOUS | Status: AC
  Administered 2014-03-26: 10 [IU] via SUBCUTANEOUS

## 2014-03-26 MED ORDER — GLUCOSE BLOOD VI STRP: ORAL_STRIP | Status: DC

## 2014-03-26 NOTE — Progress Notes (Signed)
Patient ID: Kimberly Wilkerson, female   DOB: March 21, 1981, 33 y.o.   MRN: [id number]  CC: DM  HPI: Patient presents to clinic today for a follow up of DM.  Patient reports that she does not check her CBG at home because she does not own a glucometer.  Patient reports that she only takes Metformin daily because she does not remember how to properly take glimepiride.  Patient complains of weakness approximately 3 hours after taking Metformin, which makes her feel as if she needs to eat.     No Known Allergies Past Medical History  Diagnosis Date  . Gestational diabetes   . Gonorrhea 2005  . Type 2 diabetes mellitus complicating pregnancy, antepartum 06/06/2012  . Fetal demise 09/11/2012   Current Outpatient Prescriptions on File Prior to Visit  Medication Sig Dispense Refill  . metFORMIN (GLUCOPHAGE) 1000 MG tablet Take 1 tablet (1,000 mg total) by mouth 2 (two) times daily with a meal.  60 tablet  11  . glimepiride (AMARYL) 2 MG tablet Take 1 tablet (2 mg total) by mouth daily before breakfast.  30 tablet  11   No current facility-administered medications on file prior to visit.   Family History  Problem Relation Age of Onset  . Diabetes Mellitus II Sister   . Diabetes Mellitus II Mother    History   Social History  . Marital Status: Single    Spouse Name: N/A    Number of Children: N/A  . Years of Education: N/A   Occupational History  . Not on file.   Social History Main Topics  . Smoking status: Never Smoker   . Smokeless tobacco: Never Used  . Alcohol Use: No  . Drug Use: No  . Sexual Activity: Yes    Partners: Male    Birth Control/ Protection: None   Other Topics Concern  . Not on file   Social History Narrative   Lives with room mate, works at [employer]: Negative.   Eyes: Negative.   Respiratory: Negative.   Cardiovascular: Negative.   Genitourinary: Negative.  Negative for frequency.  Neurological: Negative.    Endo/Heme/Allergies: Positive for polydipsia.     Objective:   Filed Vitals:   03/26/14 1100  BP: 98/62  Pulse: 85  Temp: 98.3 F (36.8 C)  Resp: 16    Physical Exam: Constitutional: Patient appears well-developed and well-nourished. No distress. HENT: Normocephalic, atraumatic, External right and left ear normal. Oropharynx is clear and moist.  Eyes: Conjunctivae and EOM are normal. PERRLA, no scleral icterus. Neck: Normal ROM. Neck supple. No JVD. No tracheal deviation. No thyromegaly. CVS: RRR, S1/S2 +, no murmurs, no gallops, no carotid bruit.  Pulmonary: Effort and breath sounds normal, no stridor, rhonchi, wheezes, rales.  Abdominal: Soft. BS +,  no distension, tenderness, rebound or guarding.  Musculoskeletal: Normal range of motion. No edema and no tenderness.  Lymphadenopathy: No lymphadenopathy noted, cervical Neuro: Alert. Normal reflexes Skin: Skin is warm and dry. No rash noted. Not diaphoretic. No erythema. No pallor. Psychiatric: Normal mood and affect. Behavior, judgment, thought content normal. DIABETIC FOOT EXAM: Examination of the feet reveals normal posterior tibial and dorsalis pedis pulses. Skin to palpation and inspection is intact, without lesions or corns. No discoloration is present. Monofilament nylon test reveals normal sensation bilaterally over plantar surfaces of distal great toe, first, third, and fifth metatarsal heads. Brisk capillary refill    Lab Results  Component Value Date   WBC 9.3  09/09/2012   HGB 12.8 09/09/2012   HCT 36.4 09/09/2012   MCV 92.2 09/09/2012   PLT 182 09/09/2012   Lab Results  Component Value Date   CREATININE 0.52 06/01/2013   BUN 8 06/01/2013   NA 131* 06/01/2013   K 4.1 06/01/2013   CL 97 06/01/2013   CO2 26 06/01/2013    Lab Results  Component Value Date   HGBA1C 9.0 06/01/2013   Lipid Panel     Component Value Date/Time   CHOL 248* 06/01/2013 1140   TRIG 311* 06/01/2013 1140   HDL 54 06/01/2013 1140    CHOLHDL 4.6 06/01/2013 1140   VLDL 62* 06/01/2013 1140   LDLCALC 132* 06/01/2013 1140       Assessment and plan:  Kimberly Wilkerson was seen today for follow-up and diabetes.  Diagnoses and associated orders for this visit:  Type II or unspecified type diabetes mellitus without mention of complication, not stated as uncontrolled - Glucose (CBG) - HgB A1c - glucose monitoring kit (FREESTYLE) monitoring kit; 1 each by Does not apply route as needed for other. - glucose blood test strip; Use as instructed - Lancets (FREESTYLE) lancets; Use as instructed - Ambulatory referral to Ophthalmology - metFORMIN (GLUCOPHAGE) 1000 MG tablet; Take 1 tablet (1,000 mg total) by mouth 2 (two) times daily with a meal. - glimepiride (AMARYL) 2 MG tablet; Take 1 tablet (2 mg total) by mouth daily before breakfast. - POCT glucose (manual entry) - insulin aspart (novoLOG) injection 10 Units; Inject 0.1 mLs (10 Units total) into the skin once.  Hyperlipidemia Patient reports that she does not want to begin statin therapy. She states that she wants to try to make aggressive lifestyle changes before beginning medication therapy.  Will recheck lipid panel in 6 months.   Obesity, unspecified Stressed the importance of weight loss relating to diabetes, hyperlipidemia, cardiovascular disease   Return in about 3 months (around 06/26/2014) for Diabetes Mellitus.      Chari Manning, NP-C [facility] [phone] 03/26/2014, 11:07 AM

## 2014-03-26 NOTE — Progress Notes (Signed)
Patient presents for F/U on DM States she has no way to take  CBG at home. States she has only been taking Metformin because she does not know how to take the glimipiride. States she starts feeling weak and like she needs to eat about 3 hours after taking metformin. Interpreter present

## 2014-03-26 NOTE — Patient Instructions (Addendum)
Diabetes mellitus tipo2 en adultos (Type 2 Diabetes Mellitus, Adult) La diabetes mellitus tipo2, generalmente denominada diabetes tipo2, es una enfermedad prolongada (crnica). En la diabetes tipo2, el pncreas no produce suficiente insulina (una hormona), las clulas son menos sensibles a la insulina que se produce (resistencia a la insulina), o ambos. Normalmente, la Johnson Controlsinsulina mueve los azcares de los alimentos a las clulas de los tejidos. Las clulas de los tejidos Cendant Corporationutilizan los azcares para Psychiatristobtener energa. La falta de insulina o la falta de una respuesta normal a la insulina hace que el exceso de azcar se acumule en la sangre en lugar de Customer service managerpenetrar en las clulas de los tejidos. Como resultado, se producen niveles altos de Bankerazcar en la sangre (hiperglucemia). El [address]fecto de los niveles altos de azcar (glucosa) puede causar muchas complicaciones.  La diabetes tipo 2 tambin se denomina diabetes del Robertsvilleadulto, pero puede ocurrir a Actuarycualquier edad.  FACTORES DE RIESGO  Una persona est predispuesta a desarrollar diabetes tipo 2 si alguien en su familia tiene la enfermedad y tambin tiene uno o ms de los siguientes factores de riesgo principales:  Sobrepeso.  Un estilo de vida inactivo.  Una historia de consumo constante de alimentos de altas caloras. Mantener un peso saludable y realizar actividad fsica regular puede reducir la probabilidad de desarrollar diabetes tipo 2. SNTOMAS  Una persona con diabetes tipo 2 no presenta sntomas en un principio. Los sntomas de la diabetes tipo 2 aparecen lentamente. Los sntomas son:  Aumento de la sed (polidipsia).  Aumento de la miccin (poliuria).  Aumento de ganas de orinar durante la noche (nocturia).  Prdida de peso. La prdida de peso puede ser muy rpida.  Infecciones frecuentes y recurrentes.  Cansancio (fatiga).  Debilidad.  Cambios en la visin, como visin borrosa.  Olor a Water quality scientistfruta en el aliento.  Dolor abdominal.  Nuseas o  vmitos.  Cortes o moretones que tardan en sanarse.  Hormigueo o adormecimiento de las manos y los pies. DIAGNSTICO Con frecuencia la diabetes tipo 2 no se diagnostica hasta que se presentan las complicaciones de la diabetes. La diabetes tipo 2 se diagnostica cuando los sntomas o las complicaciones se presentan y cuando se aumentan los niveles de glucosa en la Screvensangre. El nivel de glucosa en la sangre puede controlarse en uno o ms de los siguientes anlisis de sangre:  Medicin de glucosa de sangre en Arkoeayunas. No se le permitir comer durante al menos 8 horas antes de que se tome Colombiauna muestra de Schiller Parksangre.  Neomia DearUna prueba al azar de glucosa en sangre. El nivel de glucosa en sangre se controla en cualquier momento del da sin importar el momento en que haya comido.  Una prueba de glucosa de sangre de hemoglobina A1c. Una prueba de la hemoglobina A1c proporciona informacin sobre el control de la glucosa en la sangre durante los ltimos 3 meses.  Una prueba oral de tolerancia a la glucosa. La glucosa en la sangre se mide despus de no haber comido (ayunas) durante dos horas y despus de beber una bebida que contenga glucosa. TRATAMIENTO   Usted puede necesitar administrarse insulina o medicamentos para la diabetes todos los das para State Street Corporationmantener los niveles de glucosa en sangre en el rango deseado.  Si Botswanausa insulina, tal vez necesite ajustar la dosis segn los carbohidratos que haya consumido en cada comida o colacin. El Mount Clareobjetivo del tratamiento es mantener el nivel de azcar en sangre previo a comer (glucosa antes de las comidas) a 70-130mg /dl. INSTRUCCIONES PARA EL CUIDADO EN  EL HOGAR   Haga que su nivel de hemoglobina A1c sea verificado dos veces al ao.  Realice el monitoreo diario de glucosa en sangre segn las indicaciones de su mdico.  Supervise las cetonas en la orina cuando est enfermo y segn las indicaciones de su mdico.  Tome el medicamento para la diabetes o adminstrese insulina  segn las indicaciones de su mdico para Pharmacologistmantener el nivel de glucosa en sangre en el rango deseado.  Nunca se quede sin medicamento para la diabetes o sin insulina. Es necesario que los The Progressive Corporationreciba todos los das.  Si Botswanausa insulina, tal vez deba ajustar la cantidad de insulina administrada segn los carbohidratos consumidos. Los hidratos de carbono pueden aumentar los niveles de glucosa en la sangre, pero deben incluirse en su dieta. Los hidratos de carbono aportan vitaminas, minerales y Hobble Creekfibra que son Neomia Dearuna parte esencial de una dieta saludable. Los hidratos de carbono se encuentran en frutas, verduras, granos enteros, productos lcteos, legumbres y alimentos que contienen azcares aadidos.  Consuma alimentos saludables. Programe una cita para consultar a un nutricionista certificado que lo ayude a Chief Executive Officerestablecer un plan de alimentacin adecuado para usted.  Baje de Raynesfordpeso, si tiene sobrepeso.  Lleve una tarjeta de alerta mdica o use una pulsera o medalla de alerta mdica.  Lleve con usted una colacin de 15gramos de carbohidratos en todo momento para controlar los niveles bajos de glucosa en sangre (hipoglucemia). Algunos ejemplos de colaciones de 15 gramos de hidratos de carbono son:  Tabletas de glucosa, 3 o 4  Pasas, 2 cucharadas (24 gramos)  Caramelos de goma, 6  Galletas de Coalfieldanimales, 8  Birch HillGaseosa, 4 onzas (120 mililitros)  Pastillas de gomas, 9  Reconocer la hipoglucemia. La hipoglucemia se produce cuando los niveles de glucosa en sangre son de 70 mg/dl o menos. El riesgo de hipoglucemia aumenta durante el ayuno o al Boston Scientificsaltearse las comidas, durante o despus del ejercicio intenso y Clydemientras duerme. Los sntomas de hipoglucemia son:  Temblores o sacudidas.  Disminucin de la capacidad de concentracin.  Sudoracin.  Aumento de la frecuencia cardaca.  Dolor de Turkmenistancabeza.  Sequedad en la boca.  Hambre.  Irritabilidad.  Ansiedad.  Sueo agitado.  Alteracin del habla o de la  coordinacin.  Confusin.  Tratar la hipoglucemia rpidamente. Si usted est alerta y puede tragar con seguridad, siga la regla de 15:15:  Tome de 15 a 20gramos de glucosa de accin rpida o carbohidratos. Las opciones de accin rpida son un gel de glucosa, unas tabletas de glucosa, o 4 onzas (120 ml) de jugo de frutas, soda regular, o leche baja en grasa.  Compruebe su nivel de glucosa en sangre 15 minutos despus de tomar la glucosa.  Tome de 15 a 20 gramos ms de glucosa si el nivel repetido de glucosa en la sangre todava es de 70mg /dl o inferior.  Coma una comida o un refrigerio dentro de 1 hora una vez que los niveles de glucosa en la sangre vuelven a la normalidad.  Est atento a si siente mucha sed u orina con mayor frecuencia, porque son signos tempranos de hiperglucemia. El reconocimiento temprano de la hiperglucemia permite un tratamiento oportuno. Controle la hiperglucemia segn le indic su mdico.  Haga, al New Berlinmenos, 150minutos de actividad fsica moderada a la semana, distribuidos en, por lo menos, 3das a la semana o como lo indique su mdico. Adems, debe realizar ejercicios de resistencia por lo menos 2veces a la semana o como lo indique su mdico.  Ajuste su medicamento y  la ingesta de alimentos, segn sea necesario, si inicia un nuevo ejercicio o deporte.  Siga su plan diario de enfermo en algn momento que no puede comer o beber como de costumbre.  Evite el consumo de tabaco.  Limite el consumo de alcohol a no ms de 1 medida por da en las mujeres no embarazadas y 2 medidas en los hombres. Debe beber alcohol solo mientras come. Hable con su mdico acerca de si el alcohol es seguro para usted. Informe a su mdico si bebe alcohol varias veces a la semana.  Concurra a las consultas de control con su mdico en forma regular.  Programe un examen de la vista poco despus del diagnstico de diabetes tipo 2 y luego anualmente.  Realice diariamente el cuidado de la piel  y de los pies. Examine su piel y los pies diariamente para cortes, moretones, enrojecimiento, problemas en las uas, sangrado, ampollas o llagas. Su mdico debe hacerle un examen de los pies una vez por ao.  Cepllese los dientes y encas por lo menos dos veces al da y use hilo dental al menos una vez por da. Concurra regularmente a las visitas de control con el dentista.  Comparta su plan de control de diabetes en el trabajo o en la escuela.  Mantngase al da con las vacunas.  Aprenda a Dealer.  Obtenga educacin continuada y ayuda para la diabetes cuando sea necesario.  Participe o busque rehabilitacin segn sea necesario para mantener o mejorar la [place] y la calidad de vida. Solicite la derivacin a terapia fsica u ocupacional si tiene adormecido el pie o la mano o tiene problemas para asearse, vestirse, comer, o durante la [place] fsica. SOLICITE ATENCIN MDICA SI:   No puede comer alimentos o beber por ms de 6 horas.  Tiene nuseas y vmitos durante ms de 6 horas.  Su nivel de glucosa en sangre es mayor de 240 mg/dl.  Presenta algn cambio en el estado mental.  Desarrolla una enfermedad grave adicional.  Tuvo diarrea durante ms de 6 horas.  Ha estado enfermo o ha tenido fiebre durante un par [address] y no mejora.  Siente dolor al practicar cualquier actividad fsica. SOLICITE ATENCIN MDICA DE INMEDIATO SI:  Tiene dificultad para respirar.  Tiene niveles de cetonas moderados a altos. ASEGRESE DE QUE:  Comprende estas instrucciones.  Controlar su afeccin.  Recibir ayuda de inmediato si no mejora o si empeora. Document Released: 09/07/2005 Document Revised: 09/12/2013 [facility] Patient Information 2015 [place], [place]. This information is not intended to replace advice given to you by your health care provider. Make sure you discuss any questions you have with your health care provider. Hipoglucemia (Hypoglycemia) La hipoglucemia se  produce cuando el nivel de glucosa en la sangre es demasiado bajo. La glucosa es un tipo de azcar, que es la principal fuente de energa del cuerpo. Hormonas, como la insulina y Oncologist, Systems developer el nivel de glucosa en la [place]. La insulina reduce el nivel de la glucosa en la sangre, mientras que el glucagn lo [place]. Si tiene demasiada insulina en el torrente sanguneo o si no ingiere suficientes alimentos que contengan azcar, puede desarrollar hipoglucemia. Esta afeccin puede manifestarse en personas con o sin diabetes. Puede desarrollarse rpidamente y, como consecuencia, necesitar atencin urgente.  CAUSAS   Omitir o retrasar comidas.  No ingerir demasiados carbohidratos en las comidas.  Consumo excesivo de medicamentos para la diabetes.  No coordinar el horario de la toma de medicamentos por va oral  para la diabetes o de insulina, con las comidas, las colaciones y Agricultural consultant.  Nuseas y vmitos.  Algunos medicamentos.  Enfermedades graves, como hepatitis, trastornos renales y ciertos trastornos de Psychologist, sport and exercise.  Aumento de la actividad fsica o el ejercicio, sin ingerir alimentos adicionales o ajustar los medicamentos.  Beber alcohol en exceso.  Un trastorno nervioso que afecta las funciones corporales, como la frecuencia cardaca, presin arterial y digestin (neuropata [place]).  Una afeccin en la cual los msculos del estmago no funcionan apropiadamente (gastroparesia). Por consiguiente, los medicamentos y los alimentos no pueden absorberse Merchandiser, retail.  Pocas veces un tumor de pncreas puede producir demasiada insulina. SNTOMAS   Hambre.  Sudoraciones (diaforesis).  Cambio en la Arts development officer.  Temblores.  Dolor de [place].  Ansiedad.  Aturdimiento.  Irritabilidad.  Dificultad para concentrarse.  Sequedad en la boca.  Hormigueo o adormecimiento de las manos y los pies.  Sueo agitado o alteraciones del sueo.  Alteracin en el  habla y la coordinacin.  Cambio en el estado mental.  Convulsiones breves o prolongadas.  Agresividad  Somnolencia (letargo).  Debilidad.  Aumento de la frecuencia cardaca o palpitaciones.  Confusin.  Piel plida o de [employer].  Visin borrosa o doble.  Desmayos. DIAGNSTICO  Le harn un examen fsico y [place] historia clnica. Su mdico puede hacer un diagnstico en funcin de sus sntomas. Pueden realizarle anlisis de sangre y otras pruebas de laboratorio para Pharmacist, hospital diagnstico. Una vez realizado el diagnstico, su mdico observar si los signos y sntomas desaparecen, una vez que aumenta el nivel de la glucosa en la [place].  TRATAMIENTO  Por lo general, la hipoglucemia puede tratarse fcilmente cuando se observan sntomas.  Controle su nivel de glucosa en la sangre. Si es menor que 70 mg/dl, tome uno de los siguientes:  3 o 4 comprimidos de glucosa.   taza de jugo.   taza de una gaseosa comn.  1 taza de [employer].   a 1 pomo de glucosa en gel.  5 a 6 caramelos duros.  Evite las bebidas o los alimentos con alto contenido de grasa, que pueden retrasar el aumento de los niveles de glucosa en la [place].  No ingiera ms de la cantidad recomendada de alimentos, bebidas, gel o comprimidos que contengan azcar. Si lo hace, el nivel de glucosa en la sangre subir demasiado.  Espere de 10 a 15 minutos y vuelva a Chief Operating Officer su nivel de glucosa en la sangre. Si an es Scientist, water quality 70 mg/dl o est por debajo del intervalo indicado, repita el tratamiento.  Ingiera una colacin si falta ms de 1 hora para su prxima comida. Es posible que, [address], su nivel de glucosa en la sangre baje [place], de modo que no pueda tratarse en su casa, cuando comience a observar los sntomas. Probablemente necesite ayuda. Incluso puede desmayarse o ser incapaz de tragar. Si no puede tratarse por s solo, alguien Customer service manager al hospital.  INSTRUCCIONES PARA EL CUIDADO EN EL  HOGAR  Si tiene diabetes, siga su plan de control de la diabetes:  Tome los medicamentos segn las indicaciones.  Siga el plan de ejercicio.  Siga el plan de comidas. No saltee comidas. Coma a horario.  Controle su nivel de glucosa en la sangre peridicamente. Controle su nivel de glucosa en la sangre antes y despus de ejercitarse. Si hace ejercicio durante ms tiempo o de [employer] de lo habitual, asegrese de Chief Operating Officer su nivel de glucosa en la sangre con mayor frecuencia.  Use su pulsera o medalla de alerta mdica, que indica que usted tiene diabetes.  Identifique la causa de su hipoglucemia. Luego, desarrolle formas de prevenir la recurrencia de la hipoglucemia.  No tome un bao o una ducha caliente inmediatamente despus de una inyeccin de insulina.  Siempre lleve [employer]. Las pastillas de glucosa son fciles de Midwife.  Si va a beber alcohol, bbalo solo con las comidas.  Informe a familiares y [place] qu pueden hacer para mantenerlo seguro durante una convulsin. Esto puede incluir retirar [employer] Partners duros o filosos del rea o colocarlo de costado.  Mantenga un peso saludable. SOLICITE ATENCIN MDICA SI:   Tiene problemas para Futures trader de glucosa en la sangre dentro del intervalo indicado.  Tiene episodios frecuentes de hipoglucemia.  Siente efectos secundarios por los medicamentos prescritos.  No est seguro por qu su nivel de glucosa en la sangre es tan bajo.  Nota cambios o un nuevo problema en la visin . SOLICITE ATENCIN MDICA DE INMEDIATO SI:   Presenta confusin.  Se produce un cambio en su estado mental.  Es incapaz de tragar.  Se desmaya. Document Released: 09/07/2005 Document Revised: 09/12/2013 [facility] Patient Information 2015 [place], [place]. This information is not intended to replace advice given to you by your health care provider. Make sure you discuss any questions you have with your health care provider. Dieta  para el control del colesterol y las grasas  (Fat and Cholesterol Control Diet) Su dieta tiene un [employer] Ideal de grasa y colesterol en su sangre y rganos. El exceso de [place] y colesterol en la sangre puede afectar su:   Corazn.  Vasos sanguneos (arterias, venas).  Vescula biliar.  Hgado.  Pncreas. CONTROL DE LA GRASA Y EL COLESTEROL CON LA DIETA  Ciertos alimentos pueden subir el colesterol y otros pueden Coronita. Es importante que reemplace las grasas malas por otros tipos de [place].  No consuma:   Carnes grasas, como las salchichas y [place].  Margarina en barra y Environmental health practitioner en tubo que tienen "aceites parcialmente hidrogenados" en ellos.  Productos horneados, como galletas dulces y saladas que tienen "aceites parcialmente hidrogenados" en ellos.  Los [employer], como los aceites de coco y de [place].  Consuma los siguientes alimentos:  Cortes de peceto o lomo de carne roja.  Pollo (sin piel).  Pescado.  Ternera.  Carne de [employer].  Mariscos.  Frutas, como manzanas.  Verduras, como el brcoli, papas y zanahorias.  Frijoles, arvejas y lentejas (legumbres).  Cereales, como [place], arroz, cuscs y trigo bulgur.  Pastas (sin salsas de crema). Busque alimentos sin grasas, descremados y bajos en colesterol. Encontrar alimentos con fibra soluble y Public librarian (fitosteroles). Usted debe comer 2 gramos al da de estos alimentos.  IDENTIFIQUE LOS ALIMENTOS BAJOS EN GRASAS Y COLESTEROL   Encuentre los alimentos con fibra soluble y esteroles vegetales (fitosteroles). Debe consumir 2 gramos al da de estos alimentos. Ellos son:  Nils Pyle.  Vegetales.  Granos enteros.  Frijoles y [employer].  Frutos secos y semillas.  Lea las etiquetas del paquete. Busque los alimentos bajos en grasas saturadas, libres en grasas trans, y de bajo [place].  Elija quesos que contengan slo 2 a 3 gramos de grasa saturada por  onza.  Use margarina que sea saludable para el corazn que sea [place] de grasas trans o aceites parcialmente hidrogenados.  Evite comprar productos horneados que contengan aceites parcialmente hidrogenados. En su lugar, compre productos horneados elaborados con cereales  integrales (trigo integral o [place] de avena integral). Evite los productos horneados en cuya etiqueta diga con "harina" o "harina enriquecida".  Compre sopas en lata que no sean cremosas, con bajo contenido de sal y sin grasas adicionadas. PREPARE SUS ALIMENTOS USTED MISMO   Cocine los alimentos hervidos, horneados, al vapor o asados. No fra los alimentos.  Utilice un spray antiadherente para cocinar.  Use limn o hierbas para condimentar comidas en lugar de usar mantequilla o margarina.  Use yogur descremados, salsas o aderezos para ensaladas con bajo contenido de [place]. BAJO EN GRASAS SATURADAS / SUSTITUTOS BAJOS EN GRASA  Carnes / grasas saturadas (g)  Evite: Bife, veteado (3 oz/85 g) / 11 g  Elija: Bife, magro(3 oz/85 g) / 4 g  Evite: Hamburguesa (3 oz/85 g) / 7 g  Elija: [place]uesa, magra (3 oz/85 g) / 5 g  Evite: Jamn (3 oz/85 g) / 6 g  Elija: Jamn, corte magro (3 oz/85 g) / 2,4 g  Evite: Pollo con piel, carne oscura (3 oz/85 g) / 4 g  Elija: Pollo, sin piel, carne oscura (3 oz/85 g) / 2 g  Evite: Pollo con piel, carne blanca (3 oz/85 g) / 2,5 g  Elija: Pollo, sin piel, carne blanca (3 oz/85 g) / 1 g Lcteos / Grasa saturada (g)  Evite: Leche entera (1 taza) / 5 g  Elija: Leche descremada, 2% (1 taza) / 3 g  Elija: Leche descremada, 1% (1 taza) / 1,5 g  Elija: Leche descremada, 1 taza / 0,3 g  Evite: Queso duro (1 oz/28 g) / 6 g  Elija: Queso de [employer] (1 oz/28 g) / 2 a 3 g  Evite: Queso cottage, 4% de grasa (1 taza) / 6,5 g  Elija: Queso cottage bajo en grasa, 1% de grasa (1 taza) / 1,5 g  Evite: Helado (1 taza) / 9 g  Elija: Sorbete (1 taza) / 2,5 g  Elija: Yogur  congelado descremado (1 taza) / 0,3 g  Elija: Barra de frutas congeladas / trace  Evite: Crema batida (1 cucharada) / 3,5 g  Elija: Crema batida no lctea (1 cucharada) / 1 g Condimentos / Grasas Saturadas (g)  Evite: Mayonesa (1 cucharada) / 2 g  Elija: Mayonesa baja en grasa (1 cucharada) / 1 g  Evite: Mantequilla (1 cucharada) / 7 g  Elija: Margarina light extra (1 cucharada) / 1 g  Evite: Aceite de coco (1 cucharada) / 11,8 g  Elija: Aceite de oliva (1 cucharada) / 1,8 g  Elija: Aceite de maz (1 cucharada) / 1,7 g  Elija: Aceite de crtamo (1 cucharada) / 1,2 g  Elija: Aceite de girasol (1 cucharada) / 1,4 g  Elija: Aceite de soja (1 cucharada) / 2,4 g  Elija: Aceite de canola (1 cucharada) / 1 g Document Released: 03/08/2012 Document Revised: 01/02/2013 [employer] Patient Information 2015 [place], [place]. This information is not intended to replace advice given to you by your health care provider. Make sure you discuss any questions you have with your health care provider.

## 2014-04-05 ENCOUNTER — Ambulatory Visit: Payer: No Typology Code available for payment source | Admitting: Home Health Services

## 2014-06-15 ENCOUNTER — Other Ambulatory Visit: Payer: Self-pay | Admitting: Internal Medicine

## 2014-06-22 ENCOUNTER — Encounter: Payer: Self-pay | Admitting: Internal Medicine

## 2014-06-22 ENCOUNTER — Ambulatory Visit: Payer: No Typology Code available for payment source | Attending: Internal Medicine | Admitting: Internal Medicine

## 2014-06-22 VITALS — BP 103/71 | HR 62 | Temp 98.3°F | Resp 16 | Ht <= 58 in | Wt 160.0 lb

## 2014-06-22 DIAGNOSIS — E119 Type 2 diabetes mellitus without complications: Secondary | ICD-10-CM

## 2014-06-22 DIAGNOSIS — Z23 Encounter for immunization: Secondary | ICD-10-CM | POA: Insufficient documentation

## 2014-06-22 DIAGNOSIS — Z79899 Other long term (current) drug therapy: Secondary | ICD-10-CM | POA: Insufficient documentation

## 2014-06-22 LAB — GLUCOSE, POCT (MANUAL RESULT ENTRY): POC GLUCOSE: 102 mg/dL — AB (ref 70–99)

## 2014-06-22 LAB — POCT GLYCOSYLATED HEMOGLOBIN (HGB A1C): Hemoglobin A1C: 6.7

## 2014-06-22 MED ORDER — GLIMEPIRIDE 1 MG PO TABS: 1.0000 mg | ORAL_TABLET | Freq: Every day | ORAL | Status: DC

## 2014-06-22 MED ORDER — METFORMIN HCL 1000 MG PO TABS: 1000.0000 mg | ORAL_TABLET | Freq: Two times a day (BID) | ORAL | Status: DC

## 2014-06-22 NOTE — Progress Notes (Signed)
Patient ID: Kimberly Wilkerson, female   DOB: 1981/01/04, 33 y.o.   MRN: [id number]  CC: follow up  HPI:  Patient presents to clinic today for a follow up of DM.  She states that she takes her medication daily but states that she feels weak after taking glimepiride.  She states that she ran out of glimepiride 8 days ago and has noticed that she does not feel as bad.  She states by mid afternoon she begins to feel very weak.    No Known Allergies Past Medical History  Diagnosis Date  . Gestational diabetes   . Gonorrhea 2005  . Type 2 diabetes mellitus complicating pregnancy, antepartum 06/06/2012  . Fetal demise 09/11/2012   Current Outpatient Prescriptions on File Prior to Visit  Medication Sig Dispense Refill  . glimepiride (AMARYL) 2 MG tablet Take 1 tablet (2 mg total) by mouth daily before breakfast.  30 tablet  3  . glucose blood test strip Use as instructed  100 each  12  . glucose monitoring kit (FREESTYLE) monitoring kit 1 each by Does not apply route as needed for other.  1 each  0  . Lancets (FREESTYLE) lancets Use as instructed  100 each  12  . metFORMIN (GLUCOPHAGE) 1000 MG tablet Take 1 tablet (1,000 mg total) by mouth 2 (two) times daily with a meal.  60 tablet  3   No current facility-administered medications on file prior to visit.   Family History  Problem Relation Age of Onset  . Diabetes Mellitus II Sister   . Diabetes Mellitus II Mother    History   Social History  . Marital Status: Single    Spouse Name: N/A    Number of Children: N/A  . Years of Education: N/A   Occupational History  . Not on file.   Social History Main Topics  . Smoking status: Never Smoker   . Smokeless tobacco: Never Used  . Alcohol Use: No  . Drug Use: No  . Sexual Activity: Yes    Partners: Male    Birth Control/ Protection: None   Other Topics Concern  . Not on file   Social History Narrative   Lives with room mate, works at [employer]   Neurological: Positive for weakness. Negative for dizziness and tingling.  All other systems reviewed and are negative.    Objective:   Filed Vitals:   06/22/14 1709  BP: 103/71  Pulse: 62  Temp: 98.3 F (36.8 C)  Resp: 16    Physical Exam: Constitutional: Patient appears well-developed and well-nourished. No distress. Neck: Normal ROM. Neck supple. No JVD. No tracheal deviation. No thyromegaly. CVS: RRR, S1/S2 +, no murmurs, no gallops, no carotid bruit.  Pulmonary: Effort and breath sounds normal, no stridor, rhonchi, wheezes, rales.  Abdominal: Soft. BS +,  no distension, tenderness, rebound or guarding.  Musculoskeletal: Normal range of motion. No edema and no tenderness.  Neuro: Alert. Normal reflexes, Skin: Skin is warm and dry. No rash noted. Not diaphoretic. No erythema. No pallor. Psychiatric: Normal mood and affect. Behavior, judgment, thought content normal.  Lab Results  Component Value Date   WBC 9.3 09/09/2012   HGB 12.8 09/09/2012   HCT 36.4 09/09/2012   MCV 92.2 09/09/2012   PLT 182 09/09/2012   Lab Results  Component Value Date   CREATININE 0.52 06/01/2013   BUN 8 06/01/2013   NA 131* 06/01/2013   K 4.1 06/01/2013   CL 97 06/01/2013  CO2 26 06/01/2013    Lab Results  Component Value Date   HGBA1C 6.7 06/22/2014   Lipid Panel     Component Value Date/Time   CHOL 248* 06/01/2013 1140   TRIG 311* 06/01/2013 1140   HDL 54 06/01/2013 1140   CHOLHDL 4.6 06/01/2013 1140   VLDL 62* 06/01/2013 1140   LDLCALC 132* 06/01/2013 1140       Assessment and plan:   Tyreka was seen today for follow-up.  Diagnoses and associated orders for this visit:  Type 2 diabetes mellitus without complication - Glucose (CBG) - HgB A1c - Lipid panel; Future - COMPLETE METABOLIC PANEL WITH GFR; Future - CBC with Differential; Future - TSH; Future - Vitamin D, 25-hydroxy; Future - Decreased to(AMARYL) 1 MG tablet; Take 1 tablet (1 mg total) by mouth daily with  breakfast. - metFORMIN (GLUCOPHAGE) 1000 MG tablet; Take 1 tablet (1,000 mg total) by mouth 2 (two) times daily with a meal. - Microalbumin, urine; Future  Need for prophylactic vaccination and inoculation against influenza Influenza injection received.  Explained side effects and contraindications to patient. Information sheet given to patient.   Will add statin after lipid panel results  Patient will call [facility] she continues to notice low sugars  Return in about 1 week (around 06/29/2014) for Lab Visit and 3 mo PCP.      Chari Manning, [facility] [phone] 06/22/2014, 5:32 PM

## 2014-06-22 NOTE — Patient Instructions (Signed)
Plan de alimentacin DASH (DASH Eating Plan) DASH es la sigla en ingls de "Enfoques Alimentarios para Detener la Hipertensin". El plan de alimentacin DASH ha demostrado bajar la presin arterial elevada (hipertensin). Los beneficios adicionales para la salud pueden incluir la disminucin del riesgo de diabetes mellitus tipo2, enfermedades cardacas e ictus. Este plan tambin puede ayudar a adelgazar. QU DEBO SABER ACERCA DEL PLAN DE ALIMENTACIN DASH? Para el plan de alimentacin DASH, seguir las siguientes pautas generales:  Elija los alimentos con un valor porcentual diario de sodio de menos del 5% (segn figura en la etiqueta del alimento).  Use hierbas o aderezos sin sal, en lugar de sal de mesa o sal marina.  Consulte al mdico o farmacutico antes de usar sustitutos de la sal.  Coma productos con bajo contenido de sodio, cuya etiqueta suele decir "bajo contenido de sodio" o "sin agregado de sal".  Coma alimentos frescos.  Coma ms verduras, frutas y productos lcteos con bajo contenido de grasas.  Elija los cereales integrales. Busque la palabra "integral" en el primer lugar de la lista de ingredientes.  Elija el pescado y el pollo o el pavo sin piel ms a menudo que las carnes rojas. Limite el consumo de pescado, carne de ave y carne a 6onzas (170g) por da.  Limite el consumo de dulces, postres, azcares y bebidas azucaradas.  Elija las grasas saludables para el corazn.  Limite el consumo de queso a 1onza (28g) por da.  Consuma ms comida casera y menos de restaurante, de buf y comida rpida.  Limite el consumo de alimentos fritos.  Cocine los alimentos utilizando mtodos que no sean la fritura.  Limite las verduras enlatadas. Si las consume, enjuguelas bien para disminuir el sodio.  Cuando coma en un restaurante, pida que preparen su comida con menos sal o, en lo posible, sin nada de sal. QU ALIMENTOS PUEDO COMER? Pida ayuda a un nutricionista para  conocer las necesidades calricas individuales. Cereales Pan de salvado o integral. Arroz integral. Pastas de salvado o integrales. Quinua, trigo burgol y cereales integrales. Cereales con bajo contenido de sodio. Tortillas de harina de maz o de salvado. Pan de maz integral. Galletas saladas integrales. Galletas con bajo contenido de sodio. Vegetales Verduras frescas o congeladas (crudas, al vapor, asadas o grilladas). Jugos de tomate y verduras con contenido bajo o reducido de sodio. Pasta y salsa de tomate con contenido bajo o reducido de sodio. Verduras enlatadas con bajo contenido de sodio o reducido de sodio.  Frutas Frutas frescas, en conserva (en su jugo natural) o frutas congeladas. Carnes y otros productos con protenas Carne de res molida (al 85% o ms magra), carne de res de animales alimentados con pastos o carne de res sin la grasa. Pollo o pavo sin piel. Carne de pollo o de pavo molida. Cerdo sin la grasa. Todos los pescados y frutos de mar. Huevos. Porotos, guisantes o lentejas secos. Frutos secos y semillas sin sal. Frijoles enlatados sin sal. Lcteos Productos lcteos con bajo contenido de grasas, como leche descremada o al 1%, quesos reducidos en grasas o al 2%, ricota con bajo contenido de grasas o queso cottage, o yogur natural con bajo contenido de grasas. Quesos con contenido bajo o reducido de sodio. Grasas y aceites Margarinas en barra que no contengan grasas trans. Mayonesa y alios para ensaladas livianos o reducidos en grasas (reducidos en sodio). [place]. Aceites de crtamo, oliva o canola. Mantequilla natural de man o almendra. Otros Palomitas de maz y pretzels sin sal.   Los artculos mencionados arriba pueden no ser una lista completa de las bebidas o los alimentos recomendados. Comunquese con el nutricionista para conocer ms opciones. QU ALIMENTOS NO SE RECOMIENDAN? Cereales Pan blanco. Pastas blancas. Arroz blanco. Pan de maz refinado. Bagels y  croissants. Galletas saladas que contengan grasas trans. Vegetales Vegetales con crema o fritos. Verduras en salsa de queso. Verduras enlatadas comunes. Pasta y salsa de tomate en lata comunes. Jugos comunes de tomate y de verduras. Frutas Frutas secas. Fruta enlatada en almbar liviano o espeso. Jugo de frutas. Carnes y otros productos con protenas Cortes de carne con grasa. Costillas, alas de pollo, tocineta, salchicha, mortadela, salame, chinchulines, tocino, perros calientes, salchichas alemanas y embutidos envasados. Frutos secos y semillas con sal. Frijoles con sal en lata. Lcteos Leche entera o al 2%, crema, mezcla de leche y crema, y queso crema. Yogur entero o endulzado. Quesos o queso azul con alto contenido de grasas. Cremas no lcteas y coberturas batidas. Quesos procesados, quesos para untar o cuajadas. Condimentos Sal de cebolla y ajo, sal condimentada, sal de mesa y sal marina. Salsas en lata y envasadas. Salsa Worcestershire. Salsa trtara. Salsa barbacoa. Salsa teriyaki. Salsa de soja, incluso la que tiene contenido reducido de sodio. Salsa de carne. Salsa de pescado. Salsa de ostras. Salsa rosada. Rbano picante. Ketchup y mostaza. Saborizantes y tiernizantes para carne. Caldo en cubitos. Salsa picante. Salsa tabasco. Adobos. Aderezos para tacos. Salsas. Grasas y aceites Mantequilla, margarina en barra, manteca de cerdo, grasa, mantequilla clarificada y grasa de tocino. Aceites de coco, de palmiste o de palma. Aderezos comunes para ensalada. Otros Pickles y aceitunas. Palomitas de maz y pretzels con sal. Los artculos mencionados arriba pueden no ser una lista completa de las bebidas y los alimentos que se deben evitar. Comunquese con el nutricionista para obtener ms informacin. DNDE PUEDO ENCONTRAR MS INFORMACIN? Instituto Nacional del Corazn, del Pulmn y de la Sangre (National Heart, Lung, and Blood Institute):  www.nhlbi.nih.gov/health/health-topics/topics/dash/ Document Released: 08/27/2011 Document Revised: 01/22/2014 ExitCare Patient Information 2015 [employer]. This information is not intended to replace advice given to you by your health care provider. Make sure you discuss any questions you have with your health care provider.   

## 2014-06-22 NOTE — Progress Notes (Signed)
Pt is here following up on her diabetes. 

## 2014-07-02 ENCOUNTER — Telehealth: Payer: Self-pay | Admitting: Internal Medicine

## 2014-07-02 ENCOUNTER — Ambulatory Visit: Payer: No Typology Code available for payment source | Attending: Internal Medicine

## 2014-07-02 DIAGNOSIS — E119 Type 2 diabetes mellitus without complications: Secondary | ICD-10-CM

## 2014-07-02 LAB — COMPLETE METABOLIC PANEL WITH GFR
ALK PHOS: 60 U/L (ref 39–117)
ALT: 20 U/L (ref 0–35)
AST: 16 U/L (ref 0–37)
Albumin: 4.3 g/dL (ref 3.5–5.2)
BILIRUBIN TOTAL: 0.8 mg/dL (ref 0.2–1.2)
BUN: 11 mg/dL (ref 6–23)
CO2: 24 mEq/L (ref 19–32)
Calcium: 9.4 mg/dL (ref 8.4–10.5)
Chloride: 101 mEq/L (ref 96–112)
Creat: 0.43 mg/dL — ABNORMAL LOW (ref 0.50–1.10)
GFR, Est African American: >89 mL/min
GFR, Est Non African American: >89 mL/min
Glucose, Bld: 120 mg/dL — ABNORMAL HIGH (ref 70–99)
Potassium: 4.2 mEq/L (ref 3.5–5.3)
SODIUM: 135 meq/L (ref 135–145)
TOTAL PROTEIN: 7.6 g/dL (ref 6.0–8.3)

## 2014-07-02 LAB — CBC WITH DIFFERENTIAL/PLATELET
Basophils Absolute: 0.1 10*3/uL (ref 0.0–0.1)
Basophils Relative: 1 % (ref 0–1)
Eosinophils Absolute: 0.4 10*3/uL (ref 0.0–0.7)
Eosinophils Relative: 6 % — ABNORMAL HIGH (ref 0–5)
HEMATOCRIT: 39.3 % (ref 36.0–46.0)
HEMOGLOBIN: 13.5 g/dL (ref 12.0–15.0)
LYMPHS ABS: 2 10*3/uL (ref 0.7–4.0)
Lymphocytes Relative: 29 % (ref 12–46)
MCH: 30.5 pg (ref 26.0–34.0)
MCHC: 34.4 g/dL (ref 30.0–36.0)
MCV: 88.7 fL (ref 78.0–100.0)
MONOS PCT: 8 % (ref 3–12)
Monocytes Absolute: 0.6 10*3/uL (ref 0.1–1.0)
NEUTROS PCT: 56 % (ref 43–77)
Neutro Abs: 3.9 10*3/uL (ref 1.7–7.7)
Platelets: 324 10*3/uL (ref 150–400)
RBC: 4.43 MIL/uL (ref 3.87–5.11)
RDW: 13.3 % (ref 11.5–15.5)
WBC: 7 10*3/uL (ref 4.0–10.5)

## 2014-07-02 LAB — LIPID PANEL
CHOL/HDL RATIO: 4.1 ratio
Cholesterol: 205 mg/dL — ABNORMAL HIGH (ref 0–200)
HDL: 50 mg/dL (ref >39)
LDL CALC: 91 mg/dL (ref 0–99)
TRIGLYCERIDES: 319 mg/dL — AB (ref <150)
VLDL: 64 mg/dL — AB (ref 0–40)

## 2014-07-02 LAB — TSH: TSH: 0.881 u[IU]/mL (ref 0.350–4.500)

## 2014-07-02 NOTE — Telephone Encounter (Signed)
Patient refers when she takes Glimipiride 2 mg in the mornings she feels very weak for the next five hours. Please follow up with Patient. Patient only speaks [place].

## 2014-07-03 LAB — MICROALBUMIN, URINE: Microalb, Ur: 0.8 mg/dL (ref <2.0)

## 2014-07-03 LAB — VITAMIN D 25 HYDROXY (VIT D DEFICIENCY, FRACTURES): VIT D 25 HYDROXY: 21 ng/mL — AB (ref 30–89)

## 2014-07-06 NOTE — Telephone Encounter (Signed)
Pt notified has 3 refills of Glimipiride

## 2014-07-09 ENCOUNTER — Ambulatory Visit: Payer: No Typology Code available for payment source | Attending: Internal Medicine

## 2014-07-23 ENCOUNTER — Encounter: Payer: Self-pay | Admitting: Internal Medicine

## 2014-08-01 ENCOUNTER — Telehealth: Payer: Self-pay | Admitting: *Deleted

## 2014-08-01 MED ORDER — ATORVASTATIN CALCIUM 10 MG PO TABS: 10.0000 mg | ORAL_TABLET | Freq: Every day | ORAL | Status: DC

## 2014-08-01 NOTE — Telephone Encounter (Signed)
Pt aware of lab results Rx e-screen to [facility], Pt will pick up today

## 2014-08-01 NOTE — Telephone Encounter (Signed)
-----   Message from Ambrose FinlandValerie A Keck, NP sent at 07/24/2014  9:49 PM EST ----- Patient's cholesterol has improved since one year ago, but believes it may be beneficial for patient to start on atorvastatin 10 mg once daily.  Please explained diet and exercise to patient.  Please also have patient to get over-the-counter vitamin D 1000 international units to take daily to help with low vitamin D levels.

## 2014-12-25 ENCOUNTER — Encounter: Payer: Self-pay | Admitting: Internal Medicine

## 2014-12-25 NOTE — Progress Notes (Signed)
Patient walked in today asking to be seen after an episode of SOB this am.  Patient explained that she was doing laundry when she experienced this and it lasted for about 5 minutes.  Denies dizziness or any LOC.  She immediatly checked her BS and it was 246.  She has never experienced this before and it frightened her.  VS checked and all were normal.  She describes her normal activity level as very active and denied any recent leg pain.  Also denies a history of anxiety.  Advised patient that what she experienced was transient and probably not related to her DM.  Advised her to keep her appointment with the NP next week and if symptoms return and last longer that 5 minutes or if she becomes dizzy or lightheaded she should call 911.  Patient agreeable.  Certified interpreter was present for the exam.

## 2015-01-02 ENCOUNTER — Ambulatory Visit: Payer: Self-pay | Attending: Internal Medicine | Admitting: Internal Medicine

## 2015-01-02 ENCOUNTER — Encounter: Payer: Self-pay | Admitting: Internal Medicine

## 2015-01-02 VITALS — BP 98/65 | HR 86 | Temp 98.4°F | Resp 16 | Ht <= 58 in | Wt 164.0 lb

## 2015-01-02 DIAGNOSIS — E785 Hyperlipidemia, unspecified: Secondary | ICD-10-CM

## 2015-01-02 DIAGNOSIS — E119 Type 2 diabetes mellitus without complications: Secondary | ICD-10-CM

## 2015-01-02 LAB — GLUCOSE, POCT (MANUAL RESULT ENTRY): POC Glucose: 171 mg/dl — AB (ref 70–99)

## 2015-01-02 LAB — POCT GLYCOSYLATED HEMOGLOBIN (HGB A1C): Hemoglobin A1C: 8.1

## 2015-01-02 MED ORDER — ATORVASTATIN CALCIUM 10 MG PO TABS: 10.0000 mg | ORAL_TABLET | Freq: Every day | ORAL | Status: DC

## 2015-01-02 MED ORDER — GLIMEPIRIDE 1 MG PO TABS: 1.0000 mg | ORAL_TABLET | Freq: Every day | ORAL | Status: DC

## 2015-01-02 MED ORDER — GLUCOSE BLOOD VI STRP: ORAL_STRIP | Status: DC

## 2015-01-02 MED ORDER — METFORMIN HCL 1000 MG PO TABS: 1000.0000 mg | ORAL_TABLET | Freq: Two times a day (BID) | ORAL | Status: DC

## 2015-01-02 NOTE — Patient Instructions (Signed)
Diabetes y normas bsicas de atencin mdica (Diabetes and Standards of Medical Care) La diabetes es una enfermedad complicada. El equipo que trate su diabetes deber incluir un nutricionista, un enfermero, un educador para la diabetes, un oftalmlogo y ms. Para que todas las personas conozcan sobre su enfermedad y para que los pacientes tengan los cuidados que necesitan, se crearon las siguientes normas bsicas para un mejor control. A continuacin se indican los estudios, vacunas, medicamentos, educacin y planes que necesitar. Prueba de HbA1c Esta prueba muestra cmo ha sido controlada su glucosa en los ltimos 2 o 3 meses. Se utiliza para verificar si el plan de control de la diabetes debe ser ajustado.   Hgalos al menos 2 veces al ao si cumple los objetivos del tratamiento.  Si le han cambiado el tratamiento o si no cumple con los objetivos del tratamiento, debe hacerlo 4 veces al ao. Control de la presin arterial.  Hgalas en cada visita mdica de rutina. El objetivo es tener menos de 140/90 mm Hg en la mayora de las personas, pero 130/80 mm Hg en algunos casos. Consulte a su mdico acerca de su objetivo. Examen dental.  Concurra regularmente a las visitas de control con el dentista. Examen ocular.  Si le diagnosticaron diabetes tipo 1 siendo un nio, debe hacerse estudios al llegar a los 10 aos o ms y si ha sufrido de diabetes durante 3 a 5 aos. Se recomienda hacer anualmente los exmenes oculares despus de ese examen inicial.  Si le diagnosticaron diabetes tipo 1 siendo adulto, hgase un examen dentro de los 5 aos del diagnstico y luego una vez por ao.  Si le diagnosticaron diabetes tipo 2, hgase un estudio lo antes posible despus del diagnstico y luego una vez por ao. Examen de los pies  Se har una inspeccin visual en cada visita mdica de rutina. Estos controles observarn si hay cortes, lesiones u otros problemas en los pies.  Una vez por ao debe hacerse un  examen ms intensivo. Este examen incluye la inspeccin visual y una evaluacin de los pulsos del pie y la sensibilidad.  Contrlese los pies todas las noches para ver si hay cortes, lesiones u otros problemas. Comunquese con su mdico si observa que no se curan. Estudio de la funcin renal (microalbmina en orina)  Debe realizarse una vez por ao.  Diabetes tipo 1: La primera prueba se realiza a los 5 aos despus del diagnstico.  Diabetes tipo2: La primera prueba se realiza en el momento del diagnstico.  La creatinina srica y el ndice de filtracin glomerular estimada (eGFR, por sus siglas en ingls) se realizan una vez por ao para informar el nivel de enfermedad renal crnica, si la hubiera. Perfil lipdico (colesterol, HDL, LDL, triglicridos).  La mayora de las personas lo hacen cada 5 aos.  En relacin al LDL, el objetivo es tener menos de 100 mg/dl. Si tiene alto riesgo, el objetivo es tener menos de 70 mg/dl.  En relacin al HDL, el objetivo es tener entre 40 y 50 mg/dl para los hombres y entre 50 y 60 mg/dl para las mujeres. Un nivel de colesterol HDL de 60 mg/dl o superior da una cierta proteccin contra la enfermedad cardaca.  En relacin a los triglicridos, el objetivo es tener menos de 150 mg/dl. Vacuna contra la gripe, contra el neumococo y contra la hepatitis B  Se recomienda aplicarse la vacuna contra la gripe anualmente.  Se recomienda que las personas mayores de 65aos con diabetes se apliquen la   vacuna contra la neumona. En algunos casos, pueden administrarse dos inyecciones separadas. Pregntele al mdico si tiene la vacuna contra la neumona al da.  Tambin se recomienda la aplicacin de la vacuna contra la hepatitis B en los adultos con diabetes. Educacin para el autocontrol de la diabetes  Recomendaciones al momento del diagnstico y los controles segn sea necesario. Plan de tratamiento  Su plan de tratamiento ser revisado en cada visita  mdica. Document Released: 12/02/2009 Document Revised: 01/22/2014 [facility] Patient Information 2015 [place]. This information is not intended to replace advice given to you by your health care provider. Make sure you discuss any questions you have with your health care provider.

## 2015-01-02 NOTE — Progress Notes (Signed)
Patient ID: Kimberly Wilkerson, female   DOB: 09-Jan-1981, 34 y.o.   MRN: [id number] SUBJECTIVE: 34 y.o. female for follow up of diabetes. Diabetic Review of Systems - medication compliance: compliant some of the time, diabetic diet compliance: noncompliant some of the time, home glucose monitoring: is performed sporadically.  Other symptoms and concerns: Has been out of amaryl for 4 weeks. She reports that she takes her Metformin daily without complications. She states that she has not been exercising as before due to the cold weather.   Current Outpatient Prescriptions  Medication Sig Dispense Refill  . atorvastatin (LIPITOR) 10 MG tablet Take 1 tablet (10 mg total) by mouth daily. 90 tablet 3  . glimepiride (AMARYL) 1 MG tablet Take 1 tablet (1 mg total) by mouth daily with breakfast. 30 tablet 3  . glucose blood test strip Use as instructed 100 each 12  . glucose monitoring kit (FREESTYLE) monitoring kit 1 each by Does not apply route as needed for other. 1 each 0  . Lancets (FREESTYLE) lancets Use as instructed 100 each 12  . metFORMIN (GLUCOPHAGE) 1000 MG tablet Take 1 tablet (1,000 mg total) by mouth 2 (two) times daily with a meal. 60 tablet 3   No current facility-administered medications for this visit.    OBJECTIVE: Appearance: alert, well appearing, and in no distress and oriented to person, place, and time. BP 98/65 mmHg  Pulse 86  Temp(Src) 98.4 F (36.9 C) (Oral)  Resp 16  Ht $R'4\' 9"'Qq$  (1.448 m)  Wt 164 lb (74.39 kg)  BMI 35.48 kg/m2  SpO2 97%  LMP 01/02/2015  Exam: heart sounds normal rate, regular rhythm, normal S1, S2, no murmurs, rubs, clicks or gallops, no JVD, chest clear, no carotid bruits, feet: warm, good capillary refill, normal monofilament exam and normal sensory exam  Diagnoses and all orders for this visit:  Type 2 diabetes mellitus without complication Orders: -     Glucose (CBG) -     HgB A1c -     Refill metFORMIN (GLUCOPHAGE) 1000 MG tablet; Take 1  tablet (1,000 mg total) by mouth 2 (two) times daily with a meal. -    Refill glimepiride (AMARYL) 1 MG tablet; Take 1 tablet (1 mg total) by mouth daily with breakfast. -     glucose blood test strip; Use as instructed Diabetes Mellitus: borderline controlled Issues reviewed with her: diabetic diet discussed in detail, written exchange diet given, foot care discussed and Podiatry visits discussed, annual eye examinations at Ophthalmology discussed and long term diabetic complications discussed Dan was seen today for follow-up.  HLD (hyperlipidemia) Orders: -   Refill  atorvastatin (LIPITOR) 10 MG tablet; Take 1 tablet (10 mg total) by mouth daily.  Due to language barrier, an interpreter was present during the history-taking and subsequent discussion (and for part of the physical exam) with this patient.  Return in about 3 months (around 04/03/2015) for Diabetes Mellitus.  Chari Manning, NP 01/06/2015 7:28 PM

## 2015-01-02 NOTE — Progress Notes (Signed)
Pt is here following up on her diabetes. Pt states that her allergy's are bad right now. Interpreter [place].

## 2015-01-31 ENCOUNTER — Other Ambulatory Visit: Payer: Self-pay | Admitting: Internal Medicine

## 2015-03-04 ENCOUNTER — Ambulatory Visit: Payer: Self-pay | Attending: Family Medicine | Admitting: Family Medicine

## 2015-03-04 VITALS — BP 110/73 | HR 79 | Temp 98.2°F | Resp 18 | Ht <= 58 in | Wt 163.2 lb

## 2015-03-04 DIAGNOSIS — E118 Type 2 diabetes mellitus with unspecified complications: Secondary | ICD-10-CM

## 2015-03-04 LAB — GLUCOSE, POCT (MANUAL RESULT ENTRY): POC Glucose: 125 mg/dl — AB (ref 70–99)

## 2015-03-04 MED ORDER — PREDNISONE 20 MG PO TABS: 20.0000 mg | ORAL_TABLET | Freq: Every day | ORAL | Status: DC

## 2015-03-04 NOTE — Patient Instructions (Signed)
Keep foot elevated as much as possible for a few days May take tylenol or ibuprofen for pain.

## 2015-03-04 NOTE — Progress Notes (Signed)
Subjective:     Patient ID: Kimberly Wilkerson, female   DOB: 1981/07/03, 34 y.o.   MRN: [id number]  HPI  Patient presents today with a swollen right foot. She reports being stung by a wasp late afternoon yesterday. She denies other symptoms including throat or mouth swelling, difficulty breathing or feeling faint.  Review of Systems   Negative except as in HPI     Objective:   Physical Exam   Alert, oriented, appropriate, in no distress Skin is warm and dry. Her right foot is significantly swollen with mild tenderness. On the top of the foot. Lungs are clear to auscultation. Respiratory effort normal HS are regular w/o m,g,r BP 110/73     Assessment:     Insect bite - prednisone 20 mg. # 5, one po q day for 5 days. - elevate foot as much as possible over the next few days. - may use OTC analgesic for pain.              Henrietta Hoover, FNP-BC

## 2015-03-04 NOTE — Progress Notes (Signed)
Patient here for an insect bite/sting to her right foot. Patient reports that yesterday she saw a black wasp fly from under her feet and then her feet started to swell. Patient reports pain at an 8 and reports the pain is itching, burning, and throbbing. Patient reports that the pain is constant. Patient wants to know if the bite/sting will effect her diabetes. Patient is also concerned that the bite/sting will cause an infection.

## 2015-04-08 ENCOUNTER — Ambulatory Visit: Payer: Self-pay | Attending: Internal Medicine | Admitting: Internal Medicine

## 2015-04-08 ENCOUNTER — Encounter: Payer: Self-pay | Admitting: Internal Medicine

## 2015-04-08 VITALS — BP 97/62 | HR 77 | Temp 98.2°F | Resp 18 | Ht <= 58 in | Wt 165.6 lb

## 2015-04-08 DIAGNOSIS — E118 Type 2 diabetes mellitus with unspecified complications: Secondary | ICD-10-CM

## 2015-04-08 LAB — GLUCOSE, POCT (MANUAL RESULT ENTRY): POC Glucose: 95 mg/dl (ref 70–99)

## 2015-04-08 LAB — POCT GLYCOSYLATED HEMOGLOBIN (HGB A1C): HEMOGLOBIN A1C: 6.4

## 2015-04-08 NOTE — Progress Notes (Signed)
Patient ID: Kimberly Wilkerson, female   DOB: 07/09/1981, 34 y.o.   MRN: [id number] SUBJECTIVE: 34 y.o. female for follow up of diabetes. Diabetic Review of Systems - medication compliance: compliant all of the time, diabetic diet compliance: compliant all of the time.  Other symptoms and concerns: states "little pills" make her feel weak after taking. She reports that she checks her blood sugar 4 hours after taking amaryl and her blood sugars are usually 69. After eating lunch she states that her sugars usually go back up to 140-160's.   Current Outpatient Prescriptions  Medication Sig Dispense Refill  . atorvastatin (LIPITOR) 10 MG tablet Take 1 tablet (10 mg total) by mouth daily. 30 tablet 4  . glimepiride (AMARYL) 1 MG tablet Take 1 tablet (1 mg total) by mouth daily with breakfast. 30 tablet 4  . glucose blood test strip Use as instructed 100 each 12  . glucose monitoring kit (FREESTYLE) monitoring kit 1 each by Does not apply route as needed for other. 1 each 0  . Lancets (FREESTYLE) lancets Use as instructed 100 each 12  . metFORMIN (GLUCOPHAGE) 1000 MG tablet Take 1 tablet (1,000 mg total) by mouth 2 (two) times daily with a meal. 60 tablet 4  . predniSONE (DELTASONE) 20 MG tablet Take 1 tablet (20 mg total) by mouth daily with breakfast. (Patient not taking: Reported on 04/08/2015) 5 tablet 0   No current facility-administered medications for this visit.    OBJECTIVE: Appearance: alert, well appearing, and in no distress, oriented to person, place, and time and overweight. BP 97/62 mmHg  Pulse 77  Temp(Src) 98.2 F (36.8 C) (Oral)  Resp 18  Ht $R'4\' 9"'bv$  (1.448 m)  Wt 165 lb 9.6 oz (75.116 kg)  BMI 35.83 kg/m2  SpO2 97%  Exam: heart sounds normal rate, regular rhythm, normal S1, S2, no murmurs, rubs, clicks or gallops, no JVD, no carotid bruits  ASSESSMENT: Diabetes Mellitus: well controlled  PLAN: See orders for this visit as documented in the electronic medical  record. Issues reviewed with her: low cholesterol diet, weight control and daily exercise discussed, home glucose monitoring emphasized and all medications, side effects and compliance discussed carefully. She will take 0.5 tablet of amaryl in morning and other half at night to see if hypoglycemia improves. If cbg readings are still low at RN visit we will discontinue amaryl at that time.   Return for 3-4 weeks RN visit and 3 mo PCP .  Lance Bosch, NP 04/08/2015 4:33 PM

## 2015-04-08 NOTE — Progress Notes (Signed)
Patient here for follow up for DM.  Patient states "little pills" she was prescribed makes her feel weak.  Patient states she took Atorvastatin, Glimepiride, and Metformin.  Patient has no complaints of pain at this time.

## 2015-04-17 ENCOUNTER — Ambulatory Visit: Payer: Self-pay | Attending: Internal Medicine | Admitting: Internal Medicine

## 2015-04-17 ENCOUNTER — Encounter: Payer: Self-pay | Admitting: Internal Medicine

## 2015-04-17 VITALS — BP 95/61 | HR 92 | Temp 98.7°F | Resp 16 | Ht <= 58 in | Wt 165.0 lb

## 2015-04-17 DIAGNOSIS — Z975 Presence of (intrauterine) contraceptive device: Secondary | ICD-10-CM

## 2015-04-17 DIAGNOSIS — E118 Type 2 diabetes mellitus with unspecified complications: Secondary | ICD-10-CM

## 2015-04-17 DIAGNOSIS — Z124 Encounter for screening for malignant neoplasm of cervix: Secondary | ICD-10-CM

## 2015-04-17 LAB — GLUCOSE, POCT (MANUAL RESULT ENTRY): POC Glucose: 98 mg/dl (ref 70–99)

## 2015-04-17 NOTE — Progress Notes (Signed)
Patient ID: Kimberly Wilkerson, female   DOB: 1981-05-24, 34 y.o.   MRN: [id number]  CC: gynecological exam   HPI: Kimberly Wilkerson is a 34 y.o. female here today for a pap smear.  Patient has past medical history of type 2 diabetes mellitus. Her last pap smear was 3 years ago which was normal at that time. She denies vaginal discharge, itch, odor, lesion, dysuria, or new sexual partners. She denies breast concerns today.   Patient has No headache, No chest pain, No abdominal pain - No Nausea, No new weakness tingling or numbness, No Cough - SOB.  No Known Allergies Past Medical History  Diagnosis Date  . Gestational diabetes   . Gonorrhea 2005  . Type 2 diabetes mellitus complicating pregnancy, antepartum 06/06/2012  . Fetal demise 09/11/2012   Current Outpatient Prescriptions on File Prior to Visit  Medication Sig Dispense Refill  . atorvastatin (LIPITOR) 10 MG tablet Take 1 tablet (10 mg total) by mouth daily. 30 tablet 4  . glimepiride (AMARYL) 1 MG tablet Take 1 tablet (1 mg total) by mouth daily with breakfast. 30 tablet 4  . glucose blood test strip Use as instructed 100 each 12  . glucose monitoring kit (FREESTYLE) monitoring kit 1 each by Does not apply route as needed for other. 1 each 0  . Lancets (FREESTYLE) lancets Use as instructed 100 each 12  . metFORMIN (GLUCOPHAGE) 1000 MG tablet Take 1 tablet (1,000 mg total) by mouth 2 (two) times daily with a meal. 60 tablet 4  . predniSONE (DELTASONE) 20 MG tablet Take 1 tablet (20 mg total) by mouth daily with breakfast. 5 tablet 0   No current facility-administered medications on file prior to visit.   Family History  Problem Relation Age of Onset  . Diabetes Mellitus II Sister   . Diabetes Mellitus II Mother    History   Social History  . Marital Status: Single    Spouse Name: N/A  . Number of Children: N/A  . Years of Education: N/A   Occupational History  . Not on file.   Social History Main Topics  .  Smoking status: Never Smoker   . Smokeless tobacco: Never Used  . Alcohol Use: No  . Drug Use: No  . Sexual Activity:    Partners: Male    Birth Control/ Protection: None   Other Topics Concern  . Not on file   Social History Narrative   Lives with room mate, works at [employer]    Review of Systems: See HPI   Objective:   Filed Vitals:   04/17/15 1619  BP: 95/61  Pulse: 92  Temp: 98.7 F (37.1 C)  Resp: 16    Physical Exam  Cardiovascular: Normal rate.   Pulmonary/Chest: Right breast exhibits no mass. Left breast exhibits no mass.  Genitourinary: Uterus normal. No breast tenderness or discharge. Cervix exhibits friability. Cervix exhibits no motion tenderness and no discharge. Right adnexum displays no tenderness. Left adnexum displays no tenderness. Vaginal discharge (white) found.  Lymphadenopathy:       Right: No inguinal adenopathy present.       Left: No inguinal adenopathy present.     Lab Results  Component Value Date   WBC 7.0 07/02/2014   HGB 13.5 07/02/2014   HCT 39.3 07/02/2014   MCV 88.7 07/02/2014   PLT 324 07/02/2014   Lab Results  Component Value Date   CREATININE 0.43* 07/02/2014   BUN 11 07/02/2014   NA 135 07/02/2014  K 4.2 07/02/2014   CL 101 07/02/2014   CO2 24 07/02/2014    Lab Results  Component Value Date   HGBA1C 6.4 04/08/2015   Lipid Panel     Component Value Date/Time   CHOL 205* 07/02/2014 0915   TRIG 319* 07/02/2014 0915   HDL 50 07/02/2014 0915   CHOLHDL 4.1 07/02/2014 0915   VLDL 64* 07/02/2014 0915   LDLCALC 91 07/02/2014 0915       Assessment and plan:   Jhoana was seen today for gynecologic exam.  Diagnoses and all orders for this visit:  Papanicolaou smear Orders: -     Cytology - PAP  -     HIV antibody (with reflex) -     RPR  IUD (intrauterine device) in place Visualized on exam.   Type 2 diabetes mellitus with complication Orders: -     POCT glucose (manual entry) Continue  current regimen A1C looks great.  Due to language barrier, an interpreter was present during the history-taking and subsequent discussion (and for part of the physical exam) with this patient.   Return if symptoms worsen or fail to improve.       Lance Bosch, [place] [facility] [phone] 04/17/2015, 9:38 PM

## 2015-04-17 NOTE — Progress Notes (Signed)
Pap smear Last pap 3 years ago with normal result No vaginal discharge  IUD placed 3 years ago

## 2015-04-18 LAB — RPR

## 2015-04-18 LAB — HIV ANTIBODY (ROUTINE TESTING W REFLEX): HIV: NONREACTIVE

## 2015-04-19 LAB — CERVICOVAGINAL ANCILLARY ONLY
CHLAMYDIA, DNA PROBE: NEGATIVE
NEISSERIA GONORRHEA: NEGATIVE
Wet Prep (BD Affirm): NEGATIVE

## 2015-04-19 LAB — CYTOLOGY - PAP

## 2015-05-02 ENCOUNTER — Telehealth: Payer: Self-pay

## 2015-05-02 NOTE — Telephone Encounter (Signed)
Nurse called patient, via in house interpreter, [place]. Reached voicemail, left message for patient to return call to Larosa Rhines with [facility], [phone].  

## 2015-05-02 NOTE — Telephone Encounter (Signed)
-----   Message from Ambrose Finland, NP sent at 04/21/2015  8:40 PM EDT ----- No infections, still awaiting cytology results

## 2015-05-03 NOTE — Telephone Encounter (Signed)
Patient came into office states she received call to review results. Please f/u with patient

## 2015-05-07 NOTE — Telephone Encounter (Signed)
-----   Message from Ambrose Finland, NP sent at 04/21/2015  8:40 PM EDT ----- No infections, still awaiting cytology results

## 2015-05-07 NOTE — Telephone Encounter (Signed)
Nurse called patient, via in house interpreter, [place]. Patient verified date of birth. Patient aware of no infections. Patient aware of negative cytology on pap and agrees to repeat in 3 years. Patient voices understanding and has no further questions at this time.

## 2015-05-08 ENCOUNTER — Ambulatory Visit: Payer: Self-pay | Attending: Internal Medicine

## 2015-07-15 ENCOUNTER — Other Ambulatory Visit: Payer: Self-pay | Admitting: Internal Medicine

## 2015-07-15 NOTE — Telephone Encounter (Signed)
Patient presents to clinic needing more refills for metFORMIN (GLUCOPHAGE) 1000 MG tablet. Pharmacy recommended that she schedule an appt. I have scheduled her for the 14th of nov. Please follow up with pt if we are able to provide her with a sample until her appt. Thank you.

## 2015-08-05 ENCOUNTER — Other Ambulatory Visit: Payer: Self-pay | Admitting: Internal Medicine

## 2015-08-05 ENCOUNTER — Encounter: Payer: Self-pay | Admitting: Internal Medicine

## 2015-08-05 ENCOUNTER — Ambulatory Visit: Payer: Self-pay | Attending: Internal Medicine | Admitting: Internal Medicine

## 2015-08-05 VITALS — BP 112/74 | HR 77 | Temp 98.0°F | Resp 16 | Ht <= 58 in | Wt 166.8 lb

## 2015-08-05 DIAGNOSIS — E669 Obesity, unspecified: Secondary | ICD-10-CM

## 2015-08-05 DIAGNOSIS — E785 Hyperlipidemia, unspecified: Secondary | ICD-10-CM

## 2015-08-05 DIAGNOSIS — Z6830 Body mass index (BMI) 30.0-30.9, adult: Secondary | ICD-10-CM | POA: Insufficient documentation

## 2015-08-05 DIAGNOSIS — E119 Type 2 diabetes mellitus without complications: Secondary | ICD-10-CM

## 2015-08-05 LAB — POCT GLYCOSYLATED HEMOGLOBIN (HGB A1C): HEMOGLOBIN A1C: 7.2

## 2015-08-05 LAB — GLUCOSE, POCT (MANUAL RESULT ENTRY): POC Glucose: 112 mg/dl — AB (ref 70–99)

## 2015-08-05 MED ORDER — ATORVASTATIN CALCIUM 10 MG PO TABS: 10.0000 mg | ORAL_TABLET | Freq: Every day | ORAL | Status: DC

## 2015-08-05 MED ORDER — GLIMEPIRIDE 1 MG PO TABS: 1.0000 mg | ORAL_TABLET | Freq: Every day | ORAL | Status: DC

## 2015-08-05 MED ORDER — METFORMIN HCL 1000 MG PO TABS: ORAL_TABLET | ORAL | Status: DC

## 2015-08-05 NOTE — Progress Notes (Signed)
Patient ID: Kimberly Wilkerson, female   DOB: June 17, 1981, 34 y.o.   MRN: [id number] SUBJECTIVE: 34 y.o. female for follow up of diabetes. Diabetic Review of Systems - medication compliance: compliant all of the time, diabetic diet compliance: compliant most of the time, home glucose monitoring: is performed regularly, further diabetic ROS: no polyuria or polydipsia, no chest pain, dyspnea or TIA's, no numbness, tingling or pain in extremities, no unusual visual symptoms, no hypoglycemia.  Other symptoms and concerns: none.  Current Outpatient Prescriptions  Medication Sig Dispense Refill  . atorvastatin (LIPITOR) 10 MG tablet Take 1 tablet (10 mg total) by mouth daily. 30 tablet 4  . glimepiride (AMARYL) 1 MG tablet Take 1 tablet (1 mg total) by mouth daily with breakfast. 30 tablet 4  . metFORMIN (GLUCOPHAGE) 1000 MG tablet TAKE 1 TABLET BY MOUTH TWICE DAILY WITH A MEAL 60 tablet 4  . glucose blood test strip Use as instructed 100 each 12  . glucose monitoring kit (FREESTYLE) monitoring kit 1 each by Does not apply route as needed for other. 1 each 0  . Lancets (FREESTYLE) lancets Use as instructed 100 each 12  . predniSONE (DELTASONE) 20 MG tablet Take 1 tablet (20 mg total) by mouth daily with breakfast. 5 tablet 0   No current facility-administered medications for this visit.  Review of Systems Other than what is stated in HPI, all other systems are negative.   OBJECTIVE: Appearance: alert, well appearing, and in no distress, oriented to person, place, and time and overweight. BP 112/74 mmHg  Pulse 77  Temp(Src) 98 F (36.7 C)  Resp 16  Ht 4' 9" (1.448 m)  Wt 166 lb 12.8 oz (75.66 kg)  BMI 36.09 kg/m2  SpO2 100%  Exam: heart sounds normal rate, regular rhythm, normal S1, S2, no murmurs, rubs, clicks or gallops, no JVD, chest clear, no carotid bruits, feet: warm, good capillary refill, no trophic changes or ulcerative lesions, normal DP and PT pulses, normal monofilament exam and  normal sensory exam  ASSESSMENT: Kimberly Wilkerson was seen today for follow-up.  Diagnoses and all orders for this visit:  Type 2 diabetes mellitus without complication, without long-term current use of insulin (HCC) -     Glucose (CBG) -     HgB A1c -     Flu Vaccine QUAD 36+ mos PF IM (Fluarix & Fluzone Quad PF) -     glimepiride (AMARYL) 1 MG tablet; Take 1 tablet (1 mg total) by mouth daily with breakfast. -     metFORMIN (GLUCOPHAGE) 1000 MG tablet; TAKE 1 TABLET BY MOUTH TWICE DAILY WITH A MEAL -     Microalbumin, urine Patients diabetes is well control as evidence by consistently low a1c.  Patient will continue with current therapy and continue to make necessary lifestyle changes.  Reviewed foot care, diet, exercise, annual health maintenance with patient.   HLD (hyperlipidemia) -     atorvastatin (LIPITOR) 10 MG tablet; Take 1 tablet (10 mg total) by mouth daily. Education provided on proper lifestyle changes in order to lower cholesterol. Patient advised to maintain healthy weight and to keep total fat intake at 25-35% of total calories and carbohydrates 50-60% of total daily calories. Explained how high cholesterol places patient at risk for heart disease. Patient placed on appropriate medication and repeat labs in 6 months   Obesity (BMI 30-39.9) Weight loss discussed at length and its complications to health.  Patient will loss 10 months by next visit in 3 months.  Diet and  exercise discussed as well as calorie intake.   PLAN: See orders for this visit as documented in the electronic medical record. Issues reviewed with her: diabetic diet discussed in detail, written exchange diet given, low cholesterol diet, weight control and daily exercise discussed, foot care discussed and Podiatry visits discussed and long term diabetic complications discussed.  Return for this week lab and 3 mo PCP DM.   Lance Bosch, NP 08/05/2015 3:05 PM

## 2015-08-05 NOTE — Progress Notes (Signed)
Interpreter line used Donald PoreJorge ID# [id number] Patient here for follow up on her diabetes Patient has been out of her medications for about two weeks and Will need refills

## 2015-08-06 ENCOUNTER — Ambulatory Visit: Payer: Self-pay | Attending: Internal Medicine

## 2015-08-06 DIAGNOSIS — E119 Type 2 diabetes mellitus without complications: Secondary | ICD-10-CM

## 2015-08-06 LAB — BASIC METABOLIC PANEL
BUN: 8 mg/dL (ref 7–25)
CHLORIDE: 102 mmol/L (ref 98–110)
CO2: 25 mmol/L (ref 20–31)
Calcium: 9.4 mg/dL (ref 8.6–10.2)
Creat: 0.5 mg/dL (ref 0.50–1.10)
Glucose, Bld: 132 mg/dL — ABNORMAL HIGH (ref 65–99)
POTASSIUM: 4.7 mmol/L (ref 3.5–5.3)
Sodium: 136 mmol/L (ref 135–146)

## 2015-08-06 LAB — LIPID PANEL
CHOLESTEROL: 213 mg/dL — AB (ref 125–200)
HDL: 53 mg/dL (ref >=46)
LDL Cholesterol: 126 mg/dL (ref <130)
Total CHOL/HDL Ratio: 4 Ratio (ref <=5.0)
Triglycerides: 171 mg/dL — ABNORMAL HIGH (ref <150)
VLDL: 34 mg/dL — AB (ref <30)

## 2015-08-06 LAB — MICROALBUMIN, URINE: Microalb, Ur: 0.2 mg/dL

## 2015-08-13 ENCOUNTER — Telehealth: Payer: Self-pay

## 2015-08-13 NOTE — Telephone Encounter (Signed)
Patient returned phone call, please f/u with pt.    °

## 2015-08-13 NOTE — Telephone Encounter (Signed)
Patient not available Message left on voice mail to return our call 

## 2015-08-13 NOTE — Telephone Encounter (Signed)
-----   Message from Ambrose FinlandValerie A Keck, NP sent at 08/12/2015  3:06 PM EST ----- Is she taking the atorvastatin? Her cholesterol is higher than it was last year. If she say she is taking the medication please increase to 20 mg daily. Please revisit specific diet changes with her and exercise.

## 2015-08-14 NOTE — Telephone Encounter (Signed)
Patient returned phone call, please f/u with pt.    °

## 2015-08-26 ENCOUNTER — Telehealth: Payer: Self-pay

## 2015-08-26 DIAGNOSIS — E785 Hyperlipidemia, unspecified: Secondary | ICD-10-CM

## 2015-08-26 MED ORDER — ATORVASTATIN CALCIUM 20 MG PO TABS: 20.0000 mg | ORAL_TABLET | Freq: Every day | ORAL | Status: DC

## 2015-08-26 NOTE — Telephone Encounter (Signed)
Interpreter line used Kimberly MageJuan ID# [id number] Returned patient phone call Patient is aware that her lipitor has been increased to 20mg  daily

## 2015-10-07 MED FILL — ?ATORVASTATIN 20 MG TABLET: 20 | 30 days supply | Qty: 30 | Fill #1

## 2015-10-07 MED FILL — GLIMEPIRIDE 2 MG TABLET: 2 | 30 days supply | Qty: 15 | Fill #1

## 2015-10-07 MED FILL — ?METFORMIN HCL 1,000 MG TAB: 1000 | 30 days supply | Qty: 60 | Fill #2

## 2015-10-31 MED FILL — ATORVASTATIN 20 MG TABLET: 20 | 30 days supply | Qty: 30 | Fill #2

## 2015-10-31 MED FILL — metFORMIN HCL 1000 MG TABS: 1000 | 30 days supply | Qty: 60 | Fill #3

## 2015-11-11 ENCOUNTER — Ambulatory Visit: Payer: Self-pay | Attending: Internal Medicine

## 2015-12-10 ENCOUNTER — Other Ambulatory Visit: Payer: Self-pay | Admitting: Internal Medicine

## 2015-12-10 MED FILL — ?ATORVASTATIN 20 MG TABLET: 20 | 30 days supply | Qty: 30 | Fill #0

## 2015-12-10 MED FILL — metFORMIN HCL 1000 MG TABS: 1000 | 30 days supply | Qty: 60 | Fill #4

## 2016-01-08 MED FILL — ?ATORVASTATIN 20 MG TABLET: 20 | 30 days supply | Qty: 30 | Fill #1

## 2016-01-08 MED FILL — ?METFORMIN HCL 1,000 MG TAB: 1000 | 30 days supply | Qty: 60 | Fill #5

## 2016-02-25 MED FILL — ?METFORMIN HCL 1,000 MG TAB: 1000 | 30 days supply | Qty: 60 | Fill #0

## 2016-02-25 MED FILL — ?ATORVASTATIN 20 MG TABLET: 20 | 30 days supply | Qty: 30 | Fill #2

## 2016-04-08 MED FILL — ?ATORVASTATIN 20 MG TABLET: 20 | 30 days supply | Qty: 30 | Fill #3

## 2016-04-08 MED FILL — ?METFORMIN HCL 1,000 MG TAB: 1000 | 30 days supply | Qty: 60 | Fill #1

## 2016-06-04 MED FILL — ?METFORMIN HCL 1,000 MG TAB: 1000 | 30 days supply | Qty: 60 | Fill #2

## 2016-07-22 MED FILL — ?METFORMIN HCL 1,000 MG TAB: 1000 | 30 days supply | Qty: 60 | Fill #3

## 2016-09-17 ENCOUNTER — Other Ambulatory Visit: Payer: Self-pay | Admitting: Internal Medicine

## 2016-09-17 DIAGNOSIS — E119 Type 2 diabetes mellitus without complications: Secondary | ICD-10-CM

## 2016-09-17 NOTE — Telephone Encounter (Signed)
Pt. Came into facility requesting a refill on metFORMIN (GLUCOPHAGE) 1000 MG tablet Pt. Was told to call back 09/22/16 to schedule an appt. To est. Care.  Please f/u

## 2016-10-12 ENCOUNTER — Telehealth: Payer: Self-pay | Admitting: Internal Medicine

## 2016-10-12 DIAGNOSIS — E119 Type 2 diabetes mellitus without complications: Secondary | ICD-10-CM

## 2016-10-12 MED ORDER — METFORMIN HCL 1000 MG PO TABS: ORAL_TABLET | ORAL | 0 refills | Status: DC

## 2016-10-12 MED FILL — ?METFORMIN HCL 1,000 MG TAB: 1000 | 30 days supply | Qty: 60 | Fill #0

## 2016-10-12 NOTE — Telephone Encounter (Signed)
Metformin refilled x 30 days 

## 2016-10-12 NOTE — Telephone Encounter (Signed)
Patient came to the office to make an appt and request medication refill for metFORMIN (GLUCOPHAGE) 1000 MG tablet. Pt did schedule an appt for 10/21/16. Please call it in to our pharmacy [facility]  Thank you.

## 2016-10-21 ENCOUNTER — Encounter: Payer: Self-pay | Admitting: Family Medicine

## 2016-10-21 ENCOUNTER — Ambulatory Visit: Payer: Self-pay | Attending: Family Medicine | Admitting: Family Medicine

## 2016-10-21 VITALS — BP 107/68 | HR 72 | Temp 98.2°F | Resp 18 | Ht <= 58 in | Wt 160.0 lb

## 2016-10-21 DIAGNOSIS — E119 Type 2 diabetes mellitus without complications: Secondary | ICD-10-CM | POA: Insufficient documentation

## 2016-10-21 DIAGNOSIS — Z79899 Other long term (current) drug therapy: Secondary | ICD-10-CM | POA: Insufficient documentation

## 2016-10-21 DIAGNOSIS — Z7984 Long term (current) use of oral hypoglycemic drugs: Secondary | ICD-10-CM | POA: Insufficient documentation

## 2016-10-21 DIAGNOSIS — Z23 Encounter for immunization: Secondary | ICD-10-CM | POA: Insufficient documentation

## 2016-10-21 LAB — BASIC METABOLIC PANEL WITH GFR
BUN: 9 mg/dL (ref 7–25)
CHLORIDE: 103 mmol/L (ref 98–110)
CO2: 22 mmol/L (ref 20–31)
CREATININE: 0.49 mg/dL — AB (ref 0.50–1.10)
Calcium: 9.4 mg/dL (ref 8.6–10.2)
GFR, Est African American: >89 mL/min (ref >=60)
GFR, Est Non African American: >89 mL/min (ref >=60)
Glucose, Bld: 204 mg/dL — ABNORMAL HIGH (ref 65–99)
Potassium: 3.9 mmol/L (ref 3.5–5.3)
SODIUM: 136 mmol/L (ref 135–146)

## 2016-10-21 LAB — LIPID PANEL
CHOLESTEROL: 224 mg/dL — AB (ref <200)
HDL: 45 mg/dL — ABNORMAL LOW (ref >50)
LDL Cholesterol: 134 mg/dL — ABNORMAL HIGH (ref <100)
TRIGLYCERIDES: 227 mg/dL — AB (ref <150)
Total CHOL/HDL Ratio: 5 Ratio — ABNORMAL HIGH (ref <5.0)
VLDL: 45 mg/dL — ABNORMAL HIGH (ref <30)

## 2016-10-21 LAB — POCT GLYCOSYLATED HEMOGLOBIN (HGB A1C): Hemoglobin A1C: 9.4

## 2016-10-21 LAB — GLUCOSE, POCT (MANUAL RESULT ENTRY): POC Glucose: 236 mg/dl — AB (ref 70–99)

## 2016-10-21 MED ORDER — FREESTYLE LANCETS MISC: 12 refills | Status: DC

## 2016-10-21 MED ORDER — METFORMIN HCL 1000 MG PO TABS
ORAL_TABLET | ORAL | 2 refills | Status: DC
Start: 2016-10-21 — End: 2017-02-03

## 2016-10-21 MED ORDER — GLUCOSE BLOOD VI STRP: ORAL_STRIP | 12 refills | Status: DC

## 2016-10-21 MED ORDER — GLIMEPIRIDE 1 MG PO TABS: 1.0000 mg | ORAL_TABLET | Freq: Every day | ORAL | 2 refills | Status: DC

## 2016-10-21 MED FILL — GLIMEPIRIDE 1 MG TABLET: 1 | 30 days supply | Qty: 30 | Fill #0

## 2016-10-21 NOTE — Patient Instructions (Signed)
Schedule appointment with pharmacist in 2 weeks for DM follow up.   Diabetes mellitus tipo2 en los adultos, cuidados personales (Type 2 Diabetes Mellitus, Self Care, Adult) Las personas que tienen diabetes tipo2 (diabetes mellitus tipo2) deben Contractor nivel de glucosa en la sangre bajo control. Es posible hacerlo a travs de lo siguiente:  Nutricin.  Actividad fsica.  Cambios en el estilo de vida.  Medicamentos o insulina, si es necesario.  El 70 de los mdicos y de Producer, television/film/video. CMO ME CONTROLO EL NIVEL DE GLUCOSA EN LA SANGRE?  Contrlese la glucemia todos los [place], con la frecuencia que le hayan indicado.  Llame al mdico si el nivel de glucosa en la sangre est por encima de las cifras ideales en 2anlisis seguidos.  Contrlese el nivel de hemoglobina A1c al [employer] al ao. Realcese controles ms frecuentes si el mdico se lo indica. El mdico fijar los objetivos del tratamiento para usted. Generalmente, los resultados de los niveles de glucosa en la sangre deben ser los siguientes:  Antes de las comidas (preprandial): de 80 a 139m/dl (4,4 a 7,276ml/l).  Despus de las comidas (posprandial): menos de 18061ml (67m71ml).  Nivel de A1c: menos del 7%. QU DEBO SABER ACERCA DE LA GLUCEMIA ALTA? El nivel alto de glucosa en la sangre se denomina hiperglucemia. Conozca los signos de la hiperglucemia. Los signos pueden incluir lo siguiente:  Sentir que tiene lo siguiente:  Sed.  Apetito.  Mucho cansancio.  Necesidad de orinGarment/textile technologist mayor frecuencia que lo habitual.  Visin borrosa. QU DEBO SABER ACERCA DE LA GLUCEMIA BAJA? El nivel bajo de glucosa en la sangre se denomina hipoglucemia. Este cuadro ocurre cuando el nivel de glucosa en la sangre es igual o menor que 70mg49m(3,9mmol24m. Entre los sntomas se pueden incluir los siguientes:  Sentir que tiene lo siguiente:  Apetito.  Preocupacin o nervios (ansioso).  Sudoracin y piel  hmeda.  Confusin.  Mareos.  Somnolencia.  Nuseas.  Tener lo siguiente:  Latidos cardacos rpidos (palpitaciones).  Dolor de cabezaNetherlandsbios en la visin.  Una crisis de movimientos que no puede controlar (convulsiones).  Pesadillas.  Hormigueo y falta de sensibilidad (adormecimiento) alrededor de la boca, los labios o la lenguaMilanicultades para hacer lo siguiente:  Hablar.  Prestar atencin (concentrarse).  Moverse (coordinacin).  Dormir.  Temblores.  Desmayos.  Molestarse con facilidad (irritabilidad). Tratamiento de la glucemia baja  Para tratar la glucemia baja, ingiera un alimento o una bebida azucarada de inmediato. Si puede pensar con claridad y tragar de manera segura, siga la regla 15/15, que consiste en lo siguiente:  Consumir 15gramos de un hidrato de carbono de accin rpida. Algunos hidratos de carbono de accin rpida son los siguientes:  1pomo de glucosa en gel.  3comprimidos de azcar (comprimidos de glucosa).  6 a 8unidades de caramelos duros.  4onzas (120ml) 57mugo de frutas.  4onzas (120ml) d54mseosa comn (no diettica).  Contrlese la glucemia 15minuto42mspus de ingerir el hidrato de carbono.  Si el nivel de glucosa en la sangre todava es igual o menor que 70mg/dl (67mmol/l), 88miera nuevamente 15gramos de un hidrato de carbono.  Si el nivel de glucosa en la sangre no supera los 70mg/dl (3,58ml/l) des21m de 3intentos, solicite ayuda de inmediato.  Ingiera una comida o una colacin en el transcurso de 1hora despus de que la glucemia se haya normalizado. Tratamiento de la glucemia muy baja  Si el nivel de glucosa en la sangre es igual oDripping Springs  menor que 74m/dl (335ml/l), significa que est muy bajo (hipoglucemia grave). Esto es unEngineer, maintenance (IT)No espere hasta que los sntomas desaparezcan. Solicite atencin mdica de inmediato. Comunquese con el servicio de emergencias de su localidad (911 en los Estados  [place]). No conduzca por sus propios medios haPrincipal FinancialSi su nivel de glucosa en la sangre muy bajo y no puede ingerir ningn alimento ni bebida, tal vez deba aplicarse una inyeccin de glucagn. Un familiar o un amigo deben aprender a controlarle la glucemia y a aplicarle una inyeccin de glucagn. Pregntele al mdico si debe tener un kit de inyecciones de glucagn en su casa. QU OTRAS COSAS SON IMPORTANTES PARA CONTROLAR LA DIABETES? Medicamentos  Siga estas indicaciones respecto de la insulina y los medicamentos para la diabetes:  Tmelos como se lo haya indicado el mdico.  Ajstelos como se lo haya indicado el mdico.  No se quede sin medicamentos. La diabetes puede aumentar el riesgo de tener otras enfermedades a laBarrister's clerkEstas incluyen las cardiopatas coronarias o enfermedades renales. El mdico puede recetar medicamentos para evitar los problemas causados por la diabetes. Alimento   Opte por opciones de alimentos saludables. Estos incluyen los siguientes:  Pollo, pescado, claras de huevos y frijoles.  Avena, salvado, trigo burgol, arroz integral, quinua y mijo.  FrLambert Mody verduras frescas.  Productos lcteos descremados.  Frutos secos, aguacate, aceite de olWade aceite de canola.  Arme un plan de alimentacin con un especialista (nutricionista).  Siga las indicaciones del mdico respecto de lo que no puede comer o beber.  Beba suficiente lquido para mantener el pis (orina) claro o de color amarillo plido.  Ingiera colaciones saludables entre comidas saludables.  Lleve un registro de los hidratos de carbono que consume. Lea las etiquetas de los alimentos. Conozca el tamao de las porciones de los alimentos.  Siga el plan para los das de enfermedad cuando no pueda comer o beber normalmente. Arme este plan con el mdico, de modo que est listo para usarlo. Actividad   Practique ejercicios al menos 3veces por semana.  No deje pasar ms de 2das sin  hacer actividad fsica.  Hable con el mdico antes de comenzar un ejercicio nuevo. Es posible que el mdico deba hacer ajustes en la inCumberlandlos medicamentos o los alimentos. Estilo de vida   No use productos que contengan tabaco. Estos incluyen cigarrillos, tabaco de maHigher education careers adviser cigarrillos electrnicos. Si necesita ayuda para dejar de fumar, consulte al mdico.  Pregntele a su mdico qu cantidad de alcohol puede tomar.  Aprenda a sobrellevar el estrs. Si necesita ayuda para lograrlo, consulte al mdMeadWestvacoCuidado del cuerpo   Mantngase al da con las vacunas.  Hgase controlar los ojos y los pies por un mdico con la frecuencia que le hayan indicado.  Revsese la piel y loConstellation BrandsExamine si hay cortes, moretones, enrojecimiento, ampollas o llagas.  Cepllese los dientes y laRiverbend Use el hilo dental al menos una vez al da.  Vaya al dentista al menos una vez cada 42m57ms.  Mantenga un peso saludable. Instrucciones generales   TomDelphi venta libre y los recetados solamente como se lo haya indicado el mdico.  Comparta su plan de atencin de la diabetes con estas personas:  Compaeros de trabajo o de la escuela.  Aquellas con las que conNeosho FallsHgase anlisis de orina para detProduct manageresencia de cetonas:  Cuando est enfermo.  Como se lo  haya indicado el mdico.  Lleve consigo una tarjeta, o use un brazalete o una medalla que indiquen que es diabtico.  Pregntele al mdico lo siguiente:  Debo reunirme con Radio broadcast assistant para el cuidado de la diabetes?  Dnde puedo encontrar un grupo de apoyo para personas diabticas?  Concurra a todas las visitas de control como se lo haya indicado el mdico. Esto es importante. DNDE ENCONTRAR MS INFORMACIN: Para obtener ms informacin sobre la diabetes, visite los siguientes sitios:  Asociacin Americana de la Diabetes (American Diabetes Association):  www.diabetes.org  Asociacin Norteamericana de Instructores para el Cuidado de la Diabetes (American Association of Diabetes Educators): www.diabeteseducator.org/patient-resources Esta informacin no tiene Marine scientist el consejo del mdico. Asegrese de hacerle al mdico cualquier pregunta que tenga. Document Released: 12/30/2015 Document Revised: 12/30/2015 Document Reviewed: 10/11/2015 Elsevier Interactive Patient Education  2017 [employer].

## 2016-10-21 NOTE — Progress Notes (Signed)
Subjective:  Patient ID: Kimberly Wilkerson, female    DOB: 11/10/1980  Age: 36 y.o. MRN: [id number]  CC: Diabetes   HPI Kimberly Wilkerson presents for follow up of diabetes. Symptoms: none. Reports being without diabetic medications and supplies for 1 month. Treatment to date: Metformin and sulfonylurea.Due to lack of medication adherence effectiveness cannot be determined at this time.   Outpatient Medications Prior to Visit  Medication Sig Dispense Refill  . atorvastatin (LIPITOR) 20 MG tablet TAKE 1 TABLET BY MOUTH DAILY. 30 tablet 2  . glucose monitoring kit (FREESTYLE) monitoring kit 1 each by Does not apply route as needed for other. 1 each 0  . predniSONE (DELTASONE) 20 MG tablet Take 1 tablet (20 mg total) by mouth daily with breakfast. 5 tablet 0  . glimepiride (AMARYL) 1 MG tablet Take 1 tablet (1 mg total) by mouth daily with breakfast. 30 tablet 4  . glucose blood test strip Use as instructed 100 each 12  . Lancets (FREESTYLE) lancets Use as instructed 100 each 12  . metFORMIN (GLUCOPHAGE) 1000 MG tablet TAKE 1 TABLET BY MOUTH TWICE DAILY WITH A MEAL 60 tablet 0   No facility-administered medications prior to visit.    ROS Review of Systems  Constitutional: Negative.   Eyes: Negative.   Respiratory: Negative.   Cardiovascular: Negative.   Gastrointestinal: Negative.   Endocrine: Negative.   Genitourinary: Negative.   Skin: Negative.   Neurological: Negative.    Objective:  BP 107/68 (BP Location: Left Arm, Patient Position: Sitting, Cuff Size: Normal)   Pulse 72   Temp 98.2 F (36.8 C) (Oral)   Resp 18   Ht 4' 9" (1.448 m)   Wt 160 lb (72.6 kg)   LMP 10/14/2016   SpO2 99%   BMI 34.62 kg/m   BP/Weight 10/21/2016 08/05/2015 3/87/5643  Systolic BP 329 518 95  Diastolic BP 68 74 61  Wt. (Lbs) 160 166.8 165  BMI 34.62 36.09 35.7   Physical Exam  Constitutional: She appears well-developed and well-nourished.  Eyes: Pupils are equal, round, and  reactive to light.  Cardiovascular: Normal rate, regular rhythm, normal heart sounds and intact distal pulses.   Pulmonary/Chest: Effort normal and breath sounds normal.  Abdominal: Soft. Bowel sounds are normal.  Skin: Skin is warm and dry.  Nursing note and vitals reviewed.  Diabetic Foot Form - Detailed   Diabetic Foot Exam - detailed Diabetic Foot exam was performed with the following findings:  Yes 10/21/2016  9:30 AM  Visual Foot Exam completed.:  Yes  Pulse Foot Exam completed.:  Yes  Right Dorsalis Pedis:  Present Left Dorsalis Pedis:  Present  Semmes-Weinstein Monofilament Test   Comments:  Monofilament exam wnl.    Assessment & Plan:   Problem List Items Addressed This Visit      Endocrine   Diabetes ([place]) - Primary   Relevant Medications   glimepiride (AMARYL) 1 MG tablet   metFORMIN (GLUCOPHAGE) 1000 MG tablet   Lancets (FREESTYLE) lancets   glucose blood test strip   Other Relevant Orders   BASIC METABOLIC PANEL WITH GFR (Completed)   Lipid Panel (Completed)   POCT glucose (manual entry) (Completed)   POCT glycosylated hemoglobin (Hb A1C) (Completed)   Microalbumin/Creatinine Ratio, Urine    Other Visit Diagnoses    Needs flu shot       Relevant Orders   Flu Vaccine QUAD 36+ mos PF IM (Fluarix & Fluzone Quad PF) (Completed)     Meds ordered this  encounter  Medications  . DISCONTD: Lancets (FREESTYLE) lancets    Sig: Use as instructed    Dispense:  100 each    Refill:  12    Order Specific Question:   Supervising Provider    Answer:   Tresa Garter [[id number]]  . DISCONTD: glucose blood test strip    Sig: Use as instructed    Dispense:  100 each    Refill:  12    Order Specific Question:   Supervising Provider    Answer:   Tresa Garter [[id number]]  . glimepiride (AMARYL) 1 MG tablet    Sig: Take 1 tablet (1 mg total) by mouth daily with breakfast.    Dispense:  30 tablet    Refill:  2    All scripts in spanish please    Order Specific  Question:   Supervising Provider    Answer:   Tresa Garter [[id number]]  . metFORMIN (GLUCOPHAGE) 1000 MG tablet    Sig: TAKE 1 TABLET BY MOUTH TWICE DAILY WITH A MEAL    Dispense:  60 tablet    Refill:  2    Order Specific Question:   Supervising Provider    Answer:   Tresa Garter [id number]  . Lancets (FREESTYLE) lancets    Sig: Use as instructed    Dispense:  100 each    Refill:  12    Order Specific Question:   Supervising Provider    Answer:   Tresa Garter [[id number]]  . glucose blood test strip    Sig: Use as instructed    Dispense:  100 each    Refill:  12    Order Specific Question:   Supervising Provider    Answer:   Tresa Garter [[id number]]    Follow-up: Return in about 2 weeks (around 11/04/2016) for DM follow up with clinical pharmacist .   Alfonse Spruce FNP

## 2016-10-21 NOTE — Progress Notes (Signed)
Patient is here for establish care   Patient has not taking her meds today  Patient has not eaten today

## 2016-10-22 LAB — MICROALBUMIN / CREATININE URINE RATIO
Creatinine, Urine: 186 mg/dL (ref 20–320)
Microalb Creat Ratio: 8 mcg/mg creat (ref <30)
Microalb, Ur: 1.4 mg/dL

## 2016-10-27 ENCOUNTER — Telehealth: Payer: Self-pay

## 2016-10-27 ENCOUNTER — Other Ambulatory Visit: Payer: Self-pay | Admitting: Family Medicine

## 2016-10-27 DIAGNOSIS — E782 Mixed hyperlipidemia: Secondary | ICD-10-CM

## 2016-10-27 MED ORDER — ATORVASTATIN CALCIUM 20 MG PO TABS: 20.0000 mg | ORAL_TABLET | Freq: Every day | ORAL | 2 refills | Status: DC

## 2016-10-27 NOTE — Telephone Encounter (Signed)
CMA call to go over lab results   Patient did not answer but left a VM stating her results and to feel free to call me back if have any question about the results that I left in her VM

## 2016-10-27 NOTE — Telephone Encounter (Signed)
-----   Message from Lizbeth BarkMandesia R Hairston, FNP sent at 10/27/2016  4:53 PM EST ----- -Lipid levels were elevated. This can increase your risk of heart disease. You were prescribed atorvastatin to help lower these levels.  -Microalbumin/creatinine ratio level was normal. This tests for protein in your urine that can indicate early signs of kidney damage.

## 2016-10-28 ENCOUNTER — Other Ambulatory Visit: Payer: Self-pay | Admitting: Internal Medicine

## 2016-10-28 MED FILL — ATORVASTATIN 20 MG TABLET: 20 | 30 days supply | Qty: 30 | Fill #0

## 2016-10-28 NOTE — Telephone Encounter (Signed)
Patient returned call to cma regarding results...please follow up

## 2016-10-28 NOTE — Telephone Encounter (Signed)
CMA call to go over lab results   Patient Verify DOB  Patient was aware and understood her results and that she needs to pick up her meds from the pharmacy

## 2016-11-06 ENCOUNTER — Ambulatory Visit: Payer: Self-pay | Attending: Family Medicine

## 2016-11-23 MED FILL — GLIMEPIRIDE 1 MG TABLET: 1 | 30 days supply | Qty: 30 | Fill #1

## 2016-11-23 MED FILL — metFORMIN HCL 1000 MG TABS: 1000 | 30 days supply | Qty: 60 | Fill #0

## 2016-11-23 MED FILL — ATORVASTATIN 20 MG TABLET: 20 | 30 days supply | Qty: 30 | Fill #1

## 2016-12-23 MED FILL — ATORVASTATIN 20 MG TABLET: 20 | 30 days supply | Qty: 30 | Fill #2

## 2016-12-23 MED FILL — GLIMEPIRIDE 1 MG TABLET: 1 | 30 days supply | Qty: 30 | Fill #2

## 2016-12-23 MED FILL — ?METFORMIN HCL 1,000 MG TAB: 1000 | 30 days supply | Qty: 60 | Fill #1

## 2017-02-03 ENCOUNTER — Other Ambulatory Visit: Payer: Self-pay | Admitting: Family Medicine

## 2017-02-03 DIAGNOSIS — E119 Type 2 diabetes mellitus without complications: Secondary | ICD-10-CM

## 2017-02-03 MED FILL — ?METFORMIN HCL 1,000 MG TAB: 1000 | 30 days supply | Qty: 60 | Fill #2

## 2017-02-09 ENCOUNTER — Encounter: Payer: Self-pay | Admitting: Family Medicine

## 2017-02-18 ENCOUNTER — Ambulatory Visit: Payer: Self-pay | Attending: Family Medicine | Admitting: Family Medicine

## 2017-02-18 VITALS — BP 96/62 | HR 72 | Temp 98.7°F | Resp 18 | Ht <= 58 in | Wt 165.2 lb

## 2017-02-18 DIAGNOSIS — E0800 Diabetes mellitus due to underlying condition with hyperosmolarity without nonketotic hyperglycemic-hyperosmolar coma (NKHHC): Secondary | ICD-10-CM

## 2017-02-18 DIAGNOSIS — E119 Type 2 diabetes mellitus without complications: Secondary | ICD-10-CM | POA: Insufficient documentation

## 2017-02-18 DIAGNOSIS — Z0000 Encounter for general adult medical examination without abnormal findings: Secondary | ICD-10-CM | POA: Insufficient documentation

## 2017-02-18 DIAGNOSIS — Z7984 Long term (current) use of oral hypoglycemic drugs: Secondary | ICD-10-CM | POA: Insufficient documentation

## 2017-02-18 DIAGNOSIS — Z833 Family history of diabetes mellitus: Secondary | ICD-10-CM | POA: Insufficient documentation

## 2017-02-18 DIAGNOSIS — Z113 Encounter for screening for infections with a predominantly sexual mode of transmission: Secondary | ICD-10-CM

## 2017-02-18 LAB — POCT GLYCOSYLATED HEMOGLOBIN (HGB A1C): Hemoglobin A1C: 8.2

## 2017-02-18 LAB — GLUCOSE, POCT (MANUAL RESULT ENTRY): POC Glucose: 132 mg/dl — AB (ref 70–99)

## 2017-02-18 MED ORDER — TRUE METRIX METER W/DEVICE KIT: 1.0000 | PACK | Freq: Once | 0 refills | Status: AC

## 2017-02-18 MED ORDER — METFORMIN HCL 1000 MG PO TABS: ORAL_TABLET | ORAL | 2 refills | Status: DC

## 2017-02-18 MED ORDER — GLUCOSE BLOOD VI STRP: ORAL_STRIP | 12 refills | Status: DC

## 2017-02-18 MED ORDER — GLIMEPIRIDE 2 MG PO TABS: 2.0000 mg | ORAL_TABLET | Freq: Every day | ORAL | 2 refills | Status: DC

## 2017-02-18 MED ORDER — TRUEPLUS LANCETS 28G MISC: 1.0000 | Freq: Once | 12 refills | Status: AC

## 2017-02-18 MED FILL — TRUEplus LANCETS 28G MISC: 30 days supply | Qty: 100 | Fill #0

## 2017-02-18 MED FILL — GLIMEPIRIDE 2 MG TABLET: 2 | 30 days supply | Qty: 30 | Fill #0

## 2017-02-18 MED FILL — metFORMIN HCL 1000 MG TABS: 1000 | 30 days supply | Qty: 60 | Fill #0

## 2017-02-18 MED FILL — TRUE METRIX TEST STRIP: 30 days supply | Qty: 100 | Fill #0

## 2017-02-18 MED FILL — !TRUE METRIX BLOOD GLUCOSE: 30 days supply | Qty: 1 | Fill #0

## 2017-02-18 NOTE — Progress Notes (Signed)
Subjective:   Patient ID: Kimberly Wilkerson, female    DOB: 1981/03/24, 36 y.o.   MRN: [id number]  Chief Complaint  Patient presents with  . Annual Exam  . Diabetes   HPI Kimberly Wilkerson 36 y.o. female presentsFor annual comprehensive exam. She denies any chest pain, shortness of breath, space palpitations, or claudication symptoms. She denies any constitutional symptoms or changes to her bowel or bladder habits. She does have a past medical history of diabetes type 2. She denies any paresthesias, vision changes, nausea and vomiting, or abdominal pain. She reports family history of diabetes-mother, sister, cancer-uncle (unknown), denies any family history of hypertension. She will like STI screening as a part annual physical exam. She denies any genitourinary issues.    Past Medical History:  Diagnosis Date  . Fetal demise 09/11/2012  . Gestational diabetes   . Gonorrhea 2005  . Type 2 diabetes mellitus complicating pregnancy, antepartum 06/06/2012    Past Surgical History:  Procedure Laterality Date  . NO PAST SURGERIES      Family History  Problem Relation Age of Onset  . Diabetes Mellitus II Sister   . Diabetes Mellitus II Mother     Social History   Social History  . Marital status: Single    Spouse name: N/A  . Number of children: N/A  . Years of education: N/A   Occupational History  . Not on file.   Social History Main Topics  . Smoking status: Never Smoker  . Smokeless tobacco: Never Used  . Alcohol use No  . Drug use: No  . Sexual activity: Yes    Partners: Male    Birth control/ protection: None   Other Topics Concern  . Not on file   Social History Narrative   Lives with room mate, works at [employer] Medications Prior to Visit  Medication Sig Dispense Refill  . atorvastatin (LIPITOR) 20 MG tablet Take 1 tablet (20 mg total) by mouth daily. 30 tablet 2  . glimepiride (AMARYL) 1 MG tablet Take 1 tablet (1 mg total) by mouth  daily with breakfast. 30 tablet 2  . glucose blood test strip Use as instructed 100 each 12  . glucose monitoring kit (FREESTYLE) monitoring kit 1 each by Does not apply route as needed for other. 1 each 0  . Lancets (FREESTYLE) lancets Use as instructed 100 each 12  . metFORMIN (GLUCOPHAGE) 1000 MG tablet TAKE 1 TABLET BY MOUTH TWICE DAILY WITH A MEAL 60 tablet 0  . predniSONE (DELTASONE) 20 MG tablet Take 1 tablet (20 mg total) by mouth daily with breakfast. 5 tablet 0   No facility-administered medications prior to visit.     No Known Allergies  Review of Systems  Constitutional: Negative.   HENT: Negative.   Eyes: Negative.   Respiratory: Negative.   Cardiovascular: Negative.   Gastrointestinal: Negative.   Genitourinary: Negative.   Musculoskeletal: Negative.   Skin: Negative.   Neurological: Negative.   Endo/Heme/Allergies: Negative.   Psychiatric/Behavioral: Negative.      Objective:    Physical Exam  Constitutional: She is oriented to person, place, and time. She appears well-developed and well-nourished.  HENT:  Head: Normocephalic and atraumatic.  Right Ear: External ear normal.  Left Ear: External ear normal.  Nose: Nose normal.  Mouth/Throat: Oropharynx is clear and moist.  Eyes: Conjunctivae and EOM are normal. Pupils are equal, round, and reactive to light.  Neck: Normal range of motion. Neck supple.  Cardiovascular: Normal rate, regular  rhythm, normal heart sounds and intact distal pulses.   Pulmonary/Chest: Effort normal and breath sounds normal.  Abdominal: Soft. Bowel sounds are normal.  Musculoskeletal: Normal range of motion.  Neurological: She is alert and oriented to person, place, and time. She has normal reflexes.  Skin: Skin is warm and dry.  Psychiatric: She has a normal mood and affect. Her behavior is normal. Thought content normal.  Nursing note and vitals reviewed.   BP 96/62 (BP Location: Left Arm, Patient Position: Sitting, Cuff Size:  Normal)   Pulse 72   Temp 98.7 F (37.1 C) (Oral)   Resp 18   Ht '4\' 9"'$  (1.448 m)   Wt 165 lb 3.2 oz (74.9 kg)   SpO2 98%   BMI 35.75 kg/m  Wt Readings from Last 3 Encounters:  02/18/17 165 lb 3.2 oz (74.9 kg)  10/21/16 160 lb (72.6 kg)  08/05/15 166 lb 12.8 oz (75.7 kg)    Immunization History  Administered Date(s) Administered  . Influenza Split 06/13/2012  . Influenza,inj,Quad PF,36+ Mos 06/22/2014, 08/05/2015, 10/21/2016    Diabetic Foot Exam - Simple   Simple Foot Form Diabetic Foot exam was performed with the following findings:  Yes 02/18/2017  2:37 PM  Visual Inspection No deformities, no ulcerations, no other skin breakdown bilaterally:  Yes Sensation Testing Intact to touch and monofilament testing bilaterally:  Yes Pulse Check Posterior Tibialis and Dorsalis pulse intact bilaterally:  Yes Comments     Lab Results  Component Value Date   TSH 1.320 02/18/2017   Lab Results  Component Value Date   WBC 7.0 07/02/2014   HGB 13.5 07/02/2014   HCT 39.3 07/02/2014   MCV 88.7 07/02/2014   PLT 324 07/02/2014   Lab Results  Component Value Date   NA 136 02/18/2017   K 4.1 02/18/2017   CO2 20 02/18/2017   GLUCOSE 114 (H) 02/18/2017   BUN 10 02/18/2017   CREATININE 0.56 (L) 02/18/2017   BILITOT 0.6 02/18/2017   ALKPHOS 69 02/18/2017   AST 41 (H) 02/18/2017   ALT 55 (H) 02/18/2017   PROT 7.6 02/18/2017   ALBUMIN 4.5 02/18/2017   CALCIUM 9.4 02/18/2017   Lab Results  Component Value Date   CHOL 203 (H) 02/18/2017   CHOL 224 (H) 10/21/2016   CHOL 213 (H) 08/06/2015   Lab Results  Component Value Date   HDL 57 02/18/2017   HDL 45 (L) 10/21/2016   HDL 53 08/06/2015   Lab Results  Component Value Date   LDLCALC 113 (H) 02/18/2017   LDLCALC 134 (H) 10/21/2016   LDLCALC 126 08/06/2015   Lab Results  Component Value Date   TRIG 167 (H) 02/18/2017   TRIG 227 (H) 10/21/2016   TRIG 171 (H) 08/06/2015   Lab Results  Component Value Date    CHOLHDL 3.6 02/18/2017   CHOLHDL 5.0 (H) 10/21/2016   CHOLHDL 4.0 08/06/2015   Lab Results  Component Value Date   HGBA1C 8.2 02/18/2017   HGBA1C 9.4 10/21/2016   HGBA1C 7.20 08/05/2015       Assessment & Plan:   Problem List Items Addressed This Visit      Endocrine   Diabetes ([place])   Hemoglobin A1c currently not at goal we'll increase dosage of glimepiride.    Encouraged patient to start taking blood sugars twice a day and keep log.   Follow-up with clinical pharmacist in the next 2 weeks.    Follow-up with PCP in 3 months.  Relevant Medications   metFORMIN (GLUCOPHAGE) 1000 MG tablet   glimepiride (AMARYL) 2 MG tablet   Other Relevant Orders   Glucose (CBG) (Completed)   HgB A1c (Completed)    Other Visit Diagnoses    Annual physical exam    -  Primary   Relevant Orders   CMP14+EGFR (Completed)   Lipid Panel (Completed)   TSH (Completed)   Vitamin D, 25-hydroxy (Completed)   Screening for STDs (sexually transmitted diseases)       Relevant Orders   HEP, RPR, HIV Panel (Completed)   Urine cytology ancillary only (Completed)   HSV(herpes simplex vrs) 1+2 ab-IgG (Completed)      Meds ordered this encounter  Medications  . Blood Glucose Monitoring Suppl (TRUE METRIX METER) w/Device KIT    Sig: 1 Device by Does not apply route once.    Dispense:  1 kit    Refill:  0  . glucose blood test strip    Sig: Use as instructed    Dispense:  100 each    Refill:  12  . TRUEPLUS LANCETS 28G MISC    Sig: 1 kit by Does not apply route once.    Dispense:  100 each    Refill:  12  . metFORMIN (GLUCOPHAGE) 1000 MG tablet    Sig: TAKE 1 TABLET BY MOUTH TWICE DAILY WITH A MEAL    Dispense:  60 tablet    Refill:  2  . glimepiride (AMARYL) 2 MG tablet    Sig: Take 1 tablet (2 mg total) by mouth daily with breakfast.    Dispense:  30 tablet    Refill:  2    All scripts in spanish please   Follow up: Return in about 2 weeks (around 03/04/2017) for DM with  Stacy.   Fredia Beets, FNP

## 2017-02-18 NOTE — Progress Notes (Signed)
Patient is here for physical   Patient denies pain for today  

## 2017-02-19 LAB — VITAMIN D 25 HYDROXY (VIT D DEFICIENCY, FRACTURES): VIT D 25 HYDROXY: 15.8 ng/mL — AB (ref 30.0–100.0)

## 2017-02-19 LAB — HSV(HERPES SIMPLEX VRS) I + II AB-IGG
HSV 1 GLYCOPROTEIN G AB, IGG: 39 {index} — AB (ref 0.00–0.90)
HSV 2 GLYCOPROTEIN G AB, IGG: 1.48 {index} — AB (ref 0.00–0.90)

## 2017-02-19 LAB — LIPID PANEL
CHOL/HDL RATIO: 3.6 ratio (ref 0.0–4.4)
Cholesterol, Total: 203 mg/dL — ABNORMAL HIGH (ref 100–199)
HDL: 57 mg/dL (ref >39)
LDL CALC: 113 mg/dL — AB (ref 0–99)
Triglycerides: 167 mg/dL — ABNORMAL HIGH (ref 0–149)
VLDL Cholesterol Cal: 33 mg/dL (ref 5–40)

## 2017-02-19 LAB — CMP14+EGFR
ALBUMIN: 4.5 g/dL (ref 3.5–5.5)
ALT: 55 IU/L — AB (ref 0–32)
AST: 41 IU/L — ABNORMAL HIGH (ref 0–40)
Albumin/Globulin Ratio: 1.5 (ref 1.2–2.2)
Alkaline Phosphatase: 69 IU/L (ref 39–117)
BILIRUBIN TOTAL: 0.6 mg/dL (ref 0.0–1.2)
BUN / CREAT RATIO: 18 (ref 9–23)
BUN: 10 mg/dL (ref 6–20)
CALCIUM: 9.4 mg/dL (ref 8.7–10.2)
CHLORIDE: 101 mmol/L (ref 96–106)
CO2: 20 mmol/L (ref 18–29)
CREATININE: 0.56 mg/dL — AB (ref 0.57–1.00)
GFR calc Af Amer: 138 mL/min/{1.73_m2} (ref >59)
GFR, EST NON AFRICAN AMERICAN: 119 mL/min/{1.73_m2} (ref >59)
GLUCOSE: 114 mg/dL — AB (ref 65–99)
Globulin, Total: 3.1 g/dL (ref 1.5–4.5)
Potassium: 4.1 mmol/L (ref 3.5–5.2)
Sodium: 136 mmol/L (ref 134–144)
Total Protein: 7.6 g/dL (ref 6.0–8.5)

## 2017-02-19 LAB — HEP, RPR, HIV PANEL
HIV SCREEN 4TH GENERATION: NONREACTIVE
Hepatitis B Surface Ag: NEGATIVE
RPR Ser Ql: NONREACTIVE

## 2017-02-19 LAB — URINE CYTOLOGY ANCILLARY ONLY
Chlamydia: NEGATIVE
Neisseria Gonorrhea: NEGATIVE
TRICH (WINDOWPATH): NEGATIVE

## 2017-02-19 LAB — TSH: TSH: 1.32 u[IU]/mL (ref 0.450–4.500)

## 2017-02-22 LAB — URINE CYTOLOGY ANCILLARY ONLY
Bacterial vaginitis: POSITIVE — AB
CANDIDA VAGINITIS: NEGATIVE

## 2017-02-23 ENCOUNTER — Other Ambulatory Visit: Payer: Self-pay | Admitting: Family Medicine

## 2017-02-23 ENCOUNTER — Telehealth: Payer: Self-pay

## 2017-02-23 DIAGNOSIS — E782 Mixed hyperlipidemia: Secondary | ICD-10-CM | POA: Insufficient documentation

## 2017-02-23 DIAGNOSIS — E559 Vitamin D deficiency, unspecified: Secondary | ICD-10-CM | POA: Insufficient documentation

## 2017-02-23 MED ORDER — VITAMIN D (ERGOCALCIFEROL) 1.25 MG (50000 UNIT) PO CAPS: 50000.0000 [IU] | ORAL_CAPSULE | ORAL | 0 refills | Status: AC

## 2017-02-23 MED ORDER — METRONIDAZOLE 500 MG PO TABS: 500.0000 mg | ORAL_TABLET | Freq: Two times a day (BID) | ORAL | 0 refills | Status: DC

## 2017-02-23 MED ORDER — ATORVASTATIN CALCIUM 40 MG PO TABS: 40.0000 mg | ORAL_TABLET | Freq: Every day | ORAL | 0 refills | Status: DC

## 2017-02-23 MED FILL — VIT D2 1.25 MG (50,000 UNIT: 1.25 MG | 56 days supply | Qty: 8 | Fill #0

## 2017-02-23 MED FILL — metroNIDAZOLE 500 MG TABS: 500 | 7 days supply | Qty: 14 | Fill #0

## 2017-02-23 MED FILL — ?ATORVASTATIN 40 MG TABLET: 40 MG | 30 days supply | Qty: 30 | Fill #0

## 2017-02-23 NOTE — Telephone Encounter (Signed)
CMA call regarding lab results   Patient Verify DOB   Patient was aware and understood  

## 2017-02-23 NOTE — Telephone Encounter (Signed)
-----   Message from Lizbeth BarkMandesia R Hairston, FNP sent at 02/23/2017  1:40 PM EDT ----- HIV, Hepatitis B, and Syphilis  are all negative.  Lipid levels were elevated. This can increase your risk of heart disease overtime. Your dose of atorvastatin will be increased.  Kidney function normal Liver function labs slightly elevated.  Avoid alcohol and Tylenol. Begin eating a diet lower in fat. Decrease your intake of fried foods and whole milk.  Vitamin D level was low. Vitamin D helps to keep bones strong. You were prescribed ergocalciferol (capsules) to increase your vitamin-d level. Once finished start taking OTC vitamin d supplement with 800 international units (IU) of vitamin-d per day.  Thyroid function normal Bacterial vaginosis was positive. You will be prescribed metronidazole. BV is caused by an overgrowth of germs in the vagina. To reduce your risk of developing BV don't douche, don't use scented soap or sprays, and use protection during sexual intercourse. Herpes type 1 which is primarily responsible for cold sores is positive. When you have cold sores do not kiss anyone, share utensils, or have oral sex. Herpes type 2 which is primarily responsible for genital herpes is positive. This test checks for herpes type 2 antibodies that indicate you have been exposed to the herpes virus in the past. To decrease the risk of spreading of herpes use a condom every time you have sex, do not have sex when you have symptoms (blisters, sores). If you develop symptoms schedule follow up.  Gonorrhea, Chlamydia, Yeast, and Trichomonas were all negative. Recommend follow up in 3 months.

## 2017-03-04 ENCOUNTER — Ambulatory Visit: Payer: Self-pay | Attending: Family Medicine | Admitting: Pharmacist

## 2017-03-04 DIAGNOSIS — Z7984 Long term (current) use of oral hypoglycemic drugs: Secondary | ICD-10-CM | POA: Insufficient documentation

## 2017-03-04 DIAGNOSIS — E119 Type 2 diabetes mellitus without complications: Secondary | ICD-10-CM | POA: Insufficient documentation

## 2017-03-04 MED ORDER — GLIMEPIRIDE 1 MG PO TABS: 1.0000 mg | ORAL_TABLET | Freq: Every day | ORAL | 2 refills | Status: DC

## 2017-03-04 NOTE — Progress Notes (Signed)
    S:     Chief Complaint  Patient presents with  . Medication Management    Patient arrives in good spirits.  Presents for diabetes evaluation, education, and management at the request of Desmond DikeMandesia Hairston/Dr. Jegede Patient was referred on 02/18/17.  Patient was last seen by Primary Care Provider on 02/18/17. Spanish interpreter Angie [phone] used for entirety of the visit.   Family/Social History: mother and sister have diabetes  Patient reports adherence with medications.  Current diabetes medications include: glimepiride 2 mg daily and metformin 1000 mg BID  Patient reports hypoglycemic events. She has had 3 episodes around noon and all 3 times she had breakfast that morning. She reports she feels dizziness, sweaty, and hungry when it gets low.  Patient reported dietary habits: Eats 3 meals/day  Patient reported exercise habits:    Patient denies nocturia.  Patient denies neuropathy. Patient denies visual changes. Patient reports self foot exams.    O:  Physical Exam   ROS   Lab Results  Component Value Date   HGBA1C 8.2 02/18/2017   There were no vitals filed for this visit.  Home fasting CBG:70s-110s 2 hour post-prandial/random CBG: 60s-150s  A/P: Diabetes longstanding currently UNcontrolled based on A1c of 8.2. Patient reports hypoglycemic events and is able to verbalize appropriate hypoglycemia management plan. Patient reports adherence with medication. Control is suboptimal due to dietary indiscretion and sedentary lifestyle.  Patient with 3 episodes of hypoglycemia post-prandially,so will decrease glimepiride to 1 mg daily. Patient to continue metformin as directed. Patient to continue to check CBGs and return to see me in 1 month.   Also reviewed all of patient's labs and new medications with her as she did not understand the changes given to her over the phone. Patient has taken metronidazole but has not started vitamin D so she will pick that up today.    Next A1C anticipated September 2018  Written patient instructions provided.  Total time in face to face counseling 20 minutes.   Follow up in Pharmacist Clinic Visit in 1 month.

## 2017-03-04 NOTE — Patient Instructions (Addendum)
Thanks for coming to see me!  Come back and see me in 1 month  Stop glimepiride 2 mg and start glimepiride 1 mg   Start the vitamin D (ergocalciferol) because your vitamin D level was low. Once finished start taking over the counter (at [facility]) vitamin d supplement with 800 international units (IU) of vitamin-d per day.   New dose of atorvastatin because cholesterol was high   Liver function labs slightly elevated.  Avoid alcohol and Tylenol. Begin eating a diet lower in fat. Decrease your intake of fried foods and whole milk.   Bacterial vaginosis was positive. You will be prescribed metronidazole. BV is caused by an overgrowth of germs in the vagina. To reduce your risk of developing BV don't douche, don't use scented soap or sprays, and use protection during sexual intercourse.  Herpes type 1 which is primarily responsible for cold sores is positive. When you have cold sores do not kiss anyone, share utensils, or have oral sex.  Herpes type 2 which is primarily responsible for genital herpes is positive. This test checks for herpes type 2 antibodies that indicate you have been exposed to the herpes virus in the past. To decrease the risk of spreading of herpes use a condom every time you have sex, do not have sex when you have symptoms (blisters, sores). If you develop symptoms schedule follow up.    Hypoglycemia Hypoglycemia is when the sugar (glucose) level in the blood is too low. Symptoms of low blood sugar may include:  Feeling: ? Hungry. ? Worried or nervous (anxious). ? Sweaty and clammy. ? Confused. ? Dizzy. ? Sleepy. ? Sick to your stomach (nauseous).  Having: ? A fast heartbeat. ? A headache. ? A change in your vision. ? Jerky movements that you cannot control (seizure). ? Nightmares. ? Tingling or no feeling (numbness) around the mouth, lips, or tongue.  Having trouble with: ? Talking. ? Paying attention (concentrating). ? Moving  (coordination). ? Sleeping.  Shaking.  Passing out (fainting).  Getting upset easily (irritability).  Low blood sugar can happen to people who have diabetes and people who do not have diabetes. Low blood sugar can happen quickly, and it can be an emergency. Treating Low Blood Sugar Low blood sugar is often treated by eating or drinking something sugary right away. If you can think clearly and swallow safely, follow the 15:15 rule:  Take 15 grams of a fast-acting carb (carbohydrate). Some fast-acting carbs are: ? 1 tube of glucose gel. ? 3 sugar tablets (glucose pills). ? 6-8 pieces of hard candy. ? 4 oz (120 mL) of fruit juice. ? 4 oz (120 mL) of regular (not diet) soda.  Check your blood sugar 15 minutes after you take the carb.  If your blood sugar is still at or below 70 mg/dL (3.9 mmol/L), take 15 grams of a carb again.  If your blood sugar does not go above 70 mg/dL (3.9 mmol/L) after 3 tries, get help right away.  After your blood sugar goes back to normal, eat a meal or a snack within 1 hour.  Treating Very Low Blood Sugar If your blood sugar is at or below 54 mg/dL (3 mmol/L), you have very low blood sugar (severe hypoglycemia). This is an emergency. Do not wait to see if the symptoms will go away. Get medical help right away. Call your local emergency services (911 in the [place] Do not drive yourself to the hospital. If you have very low blood sugar and you  cannot eat or drink, you may need a glucagon shot (injection). A family member or friend should learn how to check your blood sugar and how to give you a glucagon shot. Ask your doctor if you need to have a glucagon shot kit at home. Follow these instructions at home: General instructions  Avoid any diets that cause you to not eat enough food. Talk with your doctor before you start any new diet.  Take over-the-counter and prescription medicines only as told by your doctor.  Limit alcohol to no more than 1 drink per  day for nonpregnant women and 2 drinks per day for men. One drink equals 12 oz of beer, 5 oz of wine, or 1 oz of hard liquor.  Keep all follow-up visits as told by your doctor. This is important. If You Have Diabetes:   Make sure you know the symptoms of low blood sugar.  Always keep a source of sugar with you, such as: ? Sugar. ? Sugar tablets. ? Glucose gel. ? Fruit juice. ? Regular soda (not diet soda). ? Milk. ? Hard candy. ? Honey.  Take your medicines as told.  Follow your exercise and meal plan. ? Eat on time. Do not skip meals. ? Follow your sick day plan when you cannot eat or drink normally. Make this plan ahead of time with your doctor.  Check your blood sugar as often as told by your doctor. Always check before and after exercise.  Share your diabetes care plan with: ? Your work or school. ? People you live with.  Check your pee (urine) for ketones: ? When you are sick. ? As told by your doctor.  Carry a card or wear jewelry that says you have diabetes. If You Have Low Blood Sugar From Other Causes:   Check your blood sugar as often as told by your doctor.  Follow instructions from your doctor about what you cannot eat or drink. Contact a doctor if:  You have trouble keeping your blood sugar in your target range.  You have low blood sugar often. Get help right away if:  You still have symptoms after you eat or drink something sugary.  Your blood sugar is at or below 54 mg/dL (3 mmol/L).  You have jerky movements that you cannot control.  You pass out. These symptoms may be an emergency. Do not wait to see if the symptoms will go away. Get medical help right away. Call your local emergency services (911 in the [place] Do not drive yourself to the hospital. This information is not intended to replace advice given to you by your health care provider. Make sure you discuss any questions you have with your health care provider. Document Released:  12/02/2009 Document Revised: 02/13/2016 Document Reviewed: 10/11/2015 Elsevier Interactive Patient Education  [employer].

## 2017-03-09 ENCOUNTER — Telehealth: Payer: Self-pay | Admitting: Family Medicine

## 2017-03-09 NOTE — Telephone Encounter (Signed)
PT came to the office sicen she took her med 1 per day an not as was prescribe, she need a refill again Vitamin D, Ergocalciferol, (DRISDOL) 50000 units CAPS capsule ° °She understood this time that is 1 pill per week, pleas follow up with PT °

## 2017-03-09 NOTE — Telephone Encounter (Signed)
PT came to the office sicen she took her med 1 per day an not as was prescribe, she need a refill again Vitamin D, Ergocalciferol, (DRISDOL) 50000 units CAPS capsule  She understood this time that is 1 pill per week, pleas follow up with PT

## 2017-03-10 NOTE — Telephone Encounter (Signed)
Additional vitamin d supplementation will not be prescribed to avoid risk of vitamin d toxicity. Recommend she schedule a lab appointment for vitamin d level check.

## 2017-03-11 NOTE — Telephone Encounter (Signed)
CMA call regarding medication   Patient did not answer but CMA left a VM stating the reason of the call & if have any questions just to call back

## 2017-03-26 MED FILL — ATORVASTATIN 40 MG TABLET: 40 | 30 days supply | Qty: 30 | Fill #1

## 2017-03-26 MED FILL — ?METFORMIN HCL 1,000 MG TAB: 1000 | 30 days supply | Qty: 60 | Fill #1

## 2017-03-26 MED FILL — ?GLIMEPIRIDE 2 MG TABLET: 2 | 30 days supply | Qty: 30 | Fill #1

## 2017-04-30 MED FILL — metFORMIN HCL 1000 MG TABS: 1000 | 30 days supply | Qty: 60 | Fill #2

## 2017-04-30 MED FILL — ?ATORVASTATIN 40MG TABLET: 40 | 30 days supply | Qty: 30 | Fill #2

## 2017-04-30 MED FILL — ?GLIMEPIRIDE 2 MG TABLET: 2 | 30 days supply | Qty: 30 | Fill #2

## 2017-06-09 ENCOUNTER — Ambulatory Visit: Payer: Self-pay | Attending: Family Medicine

## 2017-06-09 ENCOUNTER — Ambulatory Visit: Payer: Self-pay | Attending: Internal Medicine | Admitting: Physician Assistant

## 2017-06-09 VITALS — BP 103/71 | HR 80 | Temp 98.4°F | Resp 18 | Ht 59.0 in | Wt 162.6 lb

## 2017-06-09 DIAGNOSIS — E119 Type 2 diabetes mellitus without complications: Secondary | ICD-10-CM | POA: Insufficient documentation

## 2017-06-09 DIAGNOSIS — Z79899 Other long term (current) drug therapy: Secondary | ICD-10-CM | POA: Insufficient documentation

## 2017-06-09 DIAGNOSIS — E0800 Diabetes mellitus due to underlying condition with hyperosmolarity without nonketotic hyperglycemic-hyperosmolar coma (NKHHC): Secondary | ICD-10-CM

## 2017-06-09 DIAGNOSIS — Z7984 Long term (current) use of oral hypoglycemic drugs: Secondary | ICD-10-CM | POA: Insufficient documentation

## 2017-06-09 DIAGNOSIS — E782 Mixed hyperlipidemia: Secondary | ICD-10-CM | POA: Insufficient documentation

## 2017-06-09 DIAGNOSIS — N6310 Unspecified lump in the right breast, unspecified quadrant: Secondary | ICD-10-CM | POA: Insufficient documentation

## 2017-06-09 LAB — GLUCOSE, POCT (MANUAL RESULT ENTRY): POC Glucose: 154 mg/dl — AB (ref 70–99)

## 2017-06-09 LAB — POCT GLYCOSYLATED HEMOGLOBIN (HGB A1C): HEMOGLOBIN A1C: 8.6

## 2017-06-09 MED ORDER — ATORVASTATIN CALCIUM 40 MG PO TABS: 40.0000 mg | ORAL_TABLET | Freq: Every day | ORAL | 0 refills | Status: DC

## 2017-06-09 MED ORDER — GLIMEPIRIDE 1 MG PO TABS: 1.0000 mg | ORAL_TABLET | Freq: Every day | ORAL | 2 refills | Status: DC

## 2017-06-09 MED ORDER — METFORMIN HCL 1000 MG PO TABS: ORAL_TABLET | ORAL | 2 refills | Status: DC

## 2017-06-09 NOTE — Progress Notes (Signed)
Patient ID: Kimberly Wilkerson, female   DOB: 1981/02/19, 36 y.o.   MRN: [id number]   Kimberly Wilkerson, is a 36 y.o. female  [id number]  [id number]  DOB - 02/04/1981  Subjective:  Chief Complaint and HPI: Kimberly Wilkerson is a 36 y.o. female here today for a breast lump that she noticed about 3 weeks ago in her R breast.  +tender.  No f/c.  No drainage.  No FH breast CA except in a cousin.  Had a mammogram or U/S about 8 years ago. LMP 05/20/2017.  Doesn't check her blood sugars regularly.  She is not always compliant on taking her Amaryl.  She usu does take her metformin.  Denies s/sx of hyper/hypoglycemia. Not compliant with diabetic diet.    [employer] interpreters used.  ROS:   Constitutional:  No f/c, No night sweats, No unexplained weight loss. EENT:  No vision changes, No blurry vision, No hearing changes. No mouth, throat, or ear problems.  Respiratory: No cough, No SOB Cardiac: No CP, no palpitations GI:  No abd pain, No N/V/D. GU: No Urinary s/sx Musculoskeletal: No joint pain Neuro: No headache, no dizziness, no motor weakness.  Skin: No rash Endocrine:  No polydipsia. No polyuria.  Psych: Denies SI/HI  No problems updated.  ALLERGIES: No Known Allergies  PAST MEDICAL HISTORY: Past Medical History:  Diagnosis Date  . Fetal demise 09/11/2012  . Gestational diabetes   . Gonorrhea 2005  . Type 2 diabetes mellitus complicating pregnancy, antepartum 06/06/2012    MEDICATIONS AT HOME: Prior to Admission medications   Medication Sig Start Date End Date Taking? Authorizing Provider  metFORMIN (GLUCOPHAGE) 1000 MG tablet TAKE 1 TABLET BY MOUTH TWICE DAILY WITH A MEAL 06/09/17  Yes Victoriah Wilds M, PA-C  atorvastatin (LIPITOR) 40 MG tablet Take 1 tablet (40 mg total) by mouth daily. 06/09/17   Anders Simmonds, PA-C  glimepiride (AMARYL) 1 MG tablet Take 1 tablet (1 mg total) by mouth daily with breakfast. 06/09/17   Anders Simmonds, PA-C  glucose  blood test strip Use as instructed 02/18/17   Lizbeth Bark, FNP     Objective:  EXAM:   Vitals:   06/09/17 1613  BP: 103/71  Pulse: 80  Resp: 18  Temp: 98.4 F (36.9 C)  TempSrc: Oral  SpO2: 97%  Weight: 162 lb 9.6 oz (73.8 kg)  Height:  (1.499 m)    General appearance : A&OX3. NAD. Non-toxic-appearing HEENT: Atraumatic and Normocephalic.  PERRLA. EOM intact.  Neck: supple, no JVD. No cervical lymphadenopathy. No thyromegaly Chest/Lungs:  Breathing-non-labored, Good air entry bilaterally, breath sounds normal without rales, rhonchi, or wheezing  CVS: S1 S2 regular, no murmurs, gallops, rubs  L breast and axilla unremarkable.  R breast 2X3 cm mass at 2 o'clock that is firm and well-demarcated and not fixed to underlying tissue.  No erythema or induration.  No axillary LN.  No nipple/skin changes. Extremities: Bilateral Lower Ext shows no edema, both legs are warm to touch with = pulse throughout Neurology:  CN II-XII grossly intact, Non focal.   Psych:  TP linear. J/I WNL. Normal speech. Appropriate eye contact and affect.  Skin:  No Rash  Data Review Lab Results  Component Value Date   HGBA1C 8.6 06/09/2017   HGBA1C 8.2 02/18/2017   HGBA1C 9.4 10/21/2016     Assessment & Plan   1. Diabetes mellitus due to underlying condition with hyperosmolarity without coma, without long-term current use of insulin (HCC) Stressed medication compliance!  Work hard on diet and exercise.  She has been skipping amaryl doses.  Take all medication doses.  No changes or additions at this time bc not adherent to current regimen. - glimepiride (AMARYL) 1 MG tablet; Take 1 tablet (1 mg total) by mouth daily with breakfast.  Dispense: 30 tablet; Refill: 2 - metFORMIN (GLUCOPHAGE) 1000 MG tablet; TAKE 1 TABLET BY MOUTH TWICE DAILY WITH A MEAL  Dispense: 60 tablet; Refill: 2 - HgB A1c - Glucose (CBG)  2. Breast mass, right Continue SBE monthly. - US BREAST COMPLETE UNI RIGHT INC  AXILLA; Future - [employer] R; Future  3. Mixed hyperlipidemia continue - atorvastatin (LIPITOR) 40 MG tablet; Take 1 tablet (40 mg total) by mouth daily.  Dispense: 90 tablet; Refill: 0  Patient have been counseled extensively about nutrition and exercise  Return in about 3 months (around 09/08/2017).  The patient was given clear instructions to go to ER or return to medical center if symptoms don't improve, worsen or new problems develop. The patient verbalized understanding. The patient was told to call to get lab results if they haven't heard anything in the next week.     Georgian Co, PA-C [facility] [place], [place] [phone]   06/09/2017, 4:54 PM

## 2017-06-09 NOTE — Patient Instructions (Addendum)
La diabetes mellitus y los alimentos (Diabetes Mellitus and Food) Es importante que controle su nivel de azcar en la sangre (glucosa). El nivel de glucosa en sangre depende en gran medida de lo que usted come. Comer alimentos saludables en las cantidades [place] a lo largo del Training and development officer, aproximadamente a la misma hora [employer], lo ayudar a Chief Technology Officer su nivel de Multimedia programmer. Tambin puede ayudarlo a retrasar o Patent attorney de la diabetes mellitus. Comer de [employer] saludable incluso puede ayudarlo a Chartered loss adjuster de presin arterial y a Science writer o Theatre manager un peso saludable. Entre las recomendaciones generales para alimentarse y Audiological scientist los alimentos de forma saludable, se incluyen las siguientes:  Respetar las comidas principales y comer colaciones con regularidad. Evitar pasar largos perodos sin comer con el fin de perder peso.  Seguir una dieta que consista principalmente en alimentos de origen vegetal, como frutas, vegetales, frutos secos, legumbres y cereales integrales.  Utilizar mtodos de coccin a baja temperatura, como hornear, en lugar de mtodos de coccin a alta temperatura, como frer en abundante aceite. Trabaje con el nutricionista para aprender a Financial planner nutricional de las etiquetas de los alimentos. CMO PUEDEN AFECTARME LOS ALIMENTOS? Carbohidratos Los carbohidratos afectan el nivel de glucosa en sangre ms que cualquier otro tipo de alimento. El nutricionista lo ayudar a Teacher, adult education cuntos carbohidratos puede consumir en cada comida y ensearle a contarlos. El recuento de carbohidratos es importante para mantener la glucosa en sangre en un nivel saludable, en especial si utiliza insulina o toma determinados medicamentos para la diabetes mellitus. Alcohol El alcohol puede provocar disminuciones sbitas de la glucosa en sangre (hipoglucemia), en especial si utiliza insulina o toma determinados medicamentos para la diabetes mellitus. La  hipoglucemia es una afeccin que puede poner en peligro la vida. Los sntomas de la hipoglucemia (somnolencia, mareos y Data processing manager) son similares a los sntomas de haber consumido mucho alcohol. Si el mdico lo autoriza a beber alcohol, hgalo con moderacin y siga estas pautas:  Las mujeres no deben beber ms de un trago por da, y los hombres no deben beber ms de dos tragos por Training and development officer. Un trago es igual a: ? 12 onzas (355 ml) de cerveza ? 5 onzas de vino (150 ml) de vino ? 1,5onzas (41ml) de bebidas espirituosas  No beba con el estmago vaco.  Mantngase hidratado. Beba agua, gaseosas dietticas o t helado sin azcar.  Las gaseosas comunes, los jugos y otros refrescos podran contener muchos carbohidratos y se Civil Service fast streamer. QU ALIMENTOS NO SE RECOMIENDAN? Cuando haga las elecciones de alimentos, es importante que recuerde que todos los alimentos son distintos. Algunos tienen menos nutrientes que otros por porcin, aunque podran tener la misma cantidad de caloras o carbohidratos. Es difcil darle al cuerpo lo que necesita cuando consume alimentos con menos nutrientes. Estos son algunos ejemplos de alimentos que debera evitar ya que contienen muchas caloras y carbohidratos, pero pocos nutrientes:  Physicist, medical trans (la mayora de los alimentos procesados incluyen grasas trans en la etiqueta de Informacin nutricional).  Gaseosas comunes.  Jugos.  Caramelos.  Dulces, como tortas, pasteles, rosquillas y [place].  Comidas fritas. QU ALIMENTOS PUEDO COMER? Consuma alimentos ricos en nutrientes, que nutrirn el cuerpo y lo mantendrn saludable. Los alimentos que debe comer tambin dependern de varios factores, como:  Las caloras que necesita.  Los medicamentos que toma.  Su peso.  El nivel de glucosa en [place].  El [place] de presin arterial.  El nivel de colesterol. Debe consumir  una amplia variedad de alimentos, por ejemplo:  Protenas. ? Cortes de [employer]. ? Protenas con bajo contenido de grasas saturadas, como pescado, clara de huevo y frijoles. Evite las carnes procesadas.  Frutas y vegetales. ? [place] y Photographer que pueden ayudar a [employer] niveles sanguneos de [place], como [place], mangos y batatas.  Productos lcteos. ? Elija productos lcteos sin grasa o con bajo contenido de [place], como [place], yogur y [address].  Cereales, panes, pastas y arroz. ? Elija cereales integrales, como panes multicereales, avena en grano y arroz integral. Estos alimentos pueden ayudar a controlar la presin arterial.  Daphene Jaeger. ? Alimentos que contengan grasas saludables, como frutos secos, Musician, aceite de [place], aceite de canola y pescado. TODOS LOS QUE PADECEN DIABETES MELLITUS TIENEN EL [place] PLAN DE [place]? Dado que todas las personas que padecen diabetes mellitus son distintas, no hay un solo plan de comidas que funcione para todos. Es muy importante que se rena con un nutricionista que lo ayudar a crear un plan de comidas adecuado para usted. Esta informacin no tiene Marine scientist el consejo del mdico. Asegrese de hacerle al mdico cualquier pregunta que tenga. Document Released: 12/15/2007 Document Revised: 09/28/2014 Document Reviewed: 08/04/2013 Elsevier Interactive Patient Education  2017 [place]. Diabetes mellitus y actividad fsica (Diabetes Mellitus and Exercise) Hacer actividad fsica habitualmente es importante para el estado de salud general, en especial si tiene diabetes (diabetes mellitus). La actividad fsica no solo se reduce a Pharmacist, hospital. Aporta muchos beneficios para la salud, como aumentar la fuerza muscular y la densidad sea, y reducir las grasas corporales y Dealer. Esto mejora el estado fsico, la flexibilidad y la resistencia, y todo ello redunda en un mejor estado de salud general. La actividad fsica tiene beneficios adicionales para los diabticos, entre ellos:  Disminuye el  apetito.  Ayuda a bajar y Consulting civil engineer glucemia bajo control.  Baja la presin arterial.  Ayuda a controlar las cantidades de sustancias grasas (lpidos) en la [place], como el colesterol y los triglicridos.  Mejora la respuesta del cuerpo a la insulina (optimizacin de la sensibilidad a la insulina).  Reduce la cantidad de insulina que el cuerpo necesita.  Reduce el riesgo de sufrir cardiopata coronaria de la siguiente forma: ? [employer] de colesterol y triglicridos. ? Aumenta los niveles de colesterol bueno. ? Disminuye la glucemia. QU ES EL PLAN DE ACTIVIDADES? El mdico o un educador para la diabetes certificado pueden ayudarlo a Engineer, petroleum del tipo y de la frecuencia de actividad fsica (plan de actividades) adecuado para usted. Asegrese de lo siguiente:  Haga por lo menos 157minutos semanales de ejercicios de intensidad moderada o vigorosa. Estos podran ser caminatas dinmicas, ciclismo o [place]. ? Haga ejercicios de elongacin y de fortalecimiento, como yoga o levantamiento de pesas, por lo menos 2veces por semana. ? Reparta la actividad en al menos 3das de la semana.  Haga algn tipo de actividad fsica [employer]. ? No deje pasar ms de 2das seguidos sin hacer algn tipo de actividad fsica. ? No permanezca inactivo durante ms de 32minutos seguidos. Tmese descansos frecuentes para caminar o estirarse.  Elija un tipo de ejercicio o de actividad que disfrute y establezca objetivos realistas.  Comience lentamente y aumente de [place] gradual la intensidad del ejercicio con el correr del [place]. QU DEBO SABER ACERCA DEL CONTROL DE LA DIABETES?  Contrlese la glucemia antes y despus de ejercitarse. ? Si la glucemia supera los 240mg /dl (  13,3mmol/l) antes de ejercitarse, hgase un control de la orina para detectar la presencia de cetonas. Si tiene cetonas en la orina, no se ejercite hasta que la glucemia se  normalice.  Conozca los sntomas de la glucemia baja (hipoglucemia) y aprenda cmo tratarla. El riesgo de tener hipoglucemia aumenta durante y despus de hacer actividad fsica. Los sntomas frecuentes de hipoglucemia pueden incluir los siguientes: ? Hambre. ? Ansiedad. ? Sudoracin y piel hmeda. ? Confusin. ? Mareos o sensacin de desvanecimiento. ? Aumento de la frecuencia cardaca o palpitaciones. ? Visin borrosa. ? Hormigueo o adormecimiento alrededor de la boca, los labios o la lengua. ? Temblores o sacudidas. ? Irritabilidad.  Tenga una colacin de carbohidratos de accin rpida disponible antes, durante y despus de ejercitarse, a fin de evitar o tratar la hipoglucemia.  Evite inyectarse insulina en las zonas del cuerpo que ejercitar. Por ejemplo, evite inyectarse insulina en: ? Los brazos, si juega al tenis. ? Las piernas, si corre.  Lleve registros de sus hbitos de actividad fsica. Esto puede ayudarlos a usted y al mdico a adaptar el plan de control de la diabetes segn sea necesario. Escriba los siguientes datos: ? Los alimentos que consume antes y despus de hacer actividad fsica. ? Los niveles de glucosa en la sangre antes y despus de hacer ejercicios. ? El tipo y cantidad de actividad fsica que realiza. ? Cuando se prev que la insulina alcance su valor mximo, si usa insulina. No haga actividad fsica en los momentos en que insulina alcanza su valor mximo.  Cuando comience un ejercicio o una actividad nuevos, trabaje con el mdico para asegurarse de que la actividad sea segura para usted y para ajustar la insulina, los medicamentos o la ingesta de alimentos segn sea necesario.  Beba gran cantidad de agua mientras hace ejercicios para evitar la deshidratacin o los golpes de calor. Beba suficiente lquido para mantener la orina clara o de color amarillo plido. Esta informacin no tiene como fin reemplazar el consejo del mdico. Asegrese de hacerle al mdico  cualquier pregunta que tenga. Document Released: 09/27/2007 Document Revised: 12/30/2015 Document Reviewed: 02/17/2016 Elsevier Interactive Patient Education  2018 Elsevier Inc.  

## 2017-06-10 MED FILL — GLIMEPIRIDE 1 MG TABLET: 1 | 30 days supply | Qty: 30 | Fill #0

## 2017-06-10 MED FILL — ?ATORVASTATIN 40MG TABLET: 40 | 30 days supply | Qty: 30 | Fill #0

## 2017-06-10 MED FILL — ?METFORMIN HCL 1,000 MG TAB: 1000 | 30 days supply | Qty: 60 | Fill #0

## 2017-06-11 ENCOUNTER — Other Ambulatory Visit (HOSPITAL_COMMUNITY): Payer: Self-pay | Admitting: *Deleted

## 2017-06-11 DIAGNOSIS — N6310 Unspecified lump in the right breast, unspecified quadrant: Secondary | ICD-10-CM

## 2017-06-15 ENCOUNTER — Other Ambulatory Visit (HOSPITAL_COMMUNITY): Payer: Self-pay | Admitting: Obstetrics and Gynecology

## 2017-06-15 ENCOUNTER — Ambulatory Visit
Admission: RE | Admit: 2017-06-15 | Discharge: 2017-06-15 | Disposition: A | Discharge status: Home / Self Care | Payer: No Typology Code available for payment source | Source: Ambulatory Visit | Attending: Obstetrics and Gynecology | Admitting: Obstetrics and Gynecology

## 2017-06-15 ENCOUNTER — Encounter (HOSPITAL_COMMUNITY): Payer: Self-pay

## 2017-06-15 ENCOUNTER — Ambulatory Visit (HOSPITAL_COMMUNITY)
Admission: RE | Admit: 2017-06-15 | Discharge: 2017-06-15 | Disposition: A | Discharge status: Home / Self Care | Payer: Self-pay | Source: Ambulatory Visit | Attending: Obstetrics and Gynecology | Admitting: Obstetrics and Gynecology

## 2017-06-15 ENCOUNTER — Other Ambulatory Visit: Payer: Self-pay

## 2017-06-15 VITALS — BP 102/60 | Temp 98.8°F

## 2017-06-15 DIAGNOSIS — R599 Enlarged lymph nodes, unspecified: Secondary | ICD-10-CM

## 2017-06-15 DIAGNOSIS — N6310 Unspecified lump in the right breast, unspecified quadrant: Secondary | ICD-10-CM

## 2017-06-15 DIAGNOSIS — N6312 Unspecified lump in the right breast, upper inner quadrant: Secondary | ICD-10-CM

## 2017-06-15 DIAGNOSIS — Z1239 Encounter for other screening for malignant neoplasm of breast: Secondary | ICD-10-CM

## 2017-06-15 NOTE — Progress Notes (Signed)
Complaints of right breast lump x 2 weeks that was painful at first but now tender to touch.   Pap Smear: Pap smear not completed today. Last Pap smear was 04/17/2015 [facility] and normal with negative HPV. Per patient has no history of an abnormal Pap smear. Last Pap smear result is in EPIC.  Physical exam: Breasts Breasts symmetrical. No skin abnormalities bilateral breasts. No nipple retraction bilateral breasts. No nipple discharge bilateral breasts. No lymphadenopathy. No lumps palpated left breast. Palpated a lump within the right breast at 2 o'clock 4 cm from the nipple. Complaints of tenderness when palpated lump. Referred patient to the [facility] for diagnostic mammogram and right breast ultrasound. Appointment scheduled for Tuesday, June 15, 2017 at 1030.        Pelvic/Bimanual No Pap smear completed today since last Pap smear and HPV typing was 04/17/2015. Pap smear not indicated per BCCCP guidelines.   Smoking History: Patient has never smoked.  Patient Navigation: Patient education provided. Access to services provided for patient through [facility] program. Spanish interpreter provided.  Used Spanish interpreter [employer] from [place].

## 2017-06-15 NOTE — Patient Instructions (Signed)
Explained breast self awareness with Kimberly Wilkerson. Patient did not need a Pap smear today due to last Pap smear and HPV typing was 04/17/2015. Let her know BCCCP will cover Pap smears and HPV typing every 5 years unless has a history of abnormal Pap smears. Referred patient to the [facility] for diagnostic mammogram and right breast ultrasound. Appointment scheduled for Tuesday, June 15, 2017 at 1030. [name] verbalized understanding.  [name], Kathaleen Maser, RN 1:23 PM

## 2017-06-16 ENCOUNTER — Ambulatory Visit
Admission: RE | Admit: 2017-06-16 | Discharge: 2017-06-16 | Disposition: A | Discharge status: Home / Self Care | Payer: No Typology Code available for payment source | Source: Ambulatory Visit | Attending: Obstetrics and Gynecology | Admitting: Obstetrics and Gynecology

## 2017-06-16 ENCOUNTER — Other Ambulatory Visit (HOSPITAL_COMMUNITY): Payer: Self-pay | Admitting: Obstetrics and Gynecology

## 2017-06-16 ENCOUNTER — Ambulatory Visit
Admission: RE | Admit: 2017-06-16 | Discharge: 2017-06-16 | Disposition: A | Discharge status: Home / Self Care | Payer: No Typology Code available for payment source | Source: Ambulatory Visit | Attending: Physician Assistant | Admitting: Physician Assistant

## 2017-06-16 DIAGNOSIS — N6310 Unspecified lump in the right breast, unspecified quadrant: Secondary | ICD-10-CM

## 2017-06-16 DIAGNOSIS — R599 Enlarged lymph nodes, unspecified: Secondary | ICD-10-CM

## 2017-06-17 ENCOUNTER — Other Ambulatory Visit: Payer: Self-pay | Admitting: Obstetrics and Gynecology

## 2017-06-17 ENCOUNTER — Ambulatory Visit
Admission: RE | Admit: 2017-06-17 | Discharge: 2017-06-17 | Disposition: A | Discharge status: Home / Self Care | Payer: No Typology Code available for payment source | Source: Ambulatory Visit | Attending: Obstetrics and Gynecology | Admitting: Obstetrics and Gynecology

## 2017-06-17 DIAGNOSIS — N6310 Unspecified lump in the right breast, unspecified quadrant: Secondary | ICD-10-CM

## 2017-06-17 DIAGNOSIS — N611 Abscess of the breast and nipple: Secondary | ICD-10-CM

## 2017-06-17 MED FILL — DOXYCYCLINE 100 MG TABLET: 100 | 10 days supply | Qty: 20 | Fill #0

## 2017-06-18 ENCOUNTER — Encounter (HOSPITAL_COMMUNITY): Payer: Self-pay | Admitting: *Deleted

## 2017-06-22 ENCOUNTER — Ambulatory Visit
Admission: RE | Admit: 2017-06-22 | Discharge: 2017-06-22 | Disposition: A | Discharge status: Home / Self Care | Payer: No Typology Code available for payment source | Source: Ambulatory Visit | Attending: Obstetrics and Gynecology | Admitting: Obstetrics and Gynecology

## 2017-06-22 DIAGNOSIS — N6310 Unspecified lump in the right breast, unspecified quadrant: Secondary | ICD-10-CM

## 2017-06-23 ENCOUNTER — Ambulatory Visit: Payer: Self-pay | Admitting: Pharmacist

## 2017-07-23 MED FILL — metFORMIN HCL 1000 MG TABS: 1000 | 30 days supply | Qty: 60 | Fill #1

## 2017-07-23 MED FILL — ?ATORVASTATIN 40MG TABLET: 40 | 30 days supply | Qty: 30 | Fill #1

## 2017-07-23 MED FILL — GLIMEPIRIDE 1 MG TABLET: 1 | 30 days supply | Qty: 30 | Fill #1

## 2017-08-27 ENCOUNTER — Other Ambulatory Visit: Payer: Self-pay | Admitting: Obstetrics and Gynecology

## 2017-08-27 DIAGNOSIS — N611 Abscess of the breast and nipple: Secondary | ICD-10-CM

## 2017-09-02 ENCOUNTER — Other Ambulatory Visit (HOSPITAL_COMMUNITY)
Admission: RE | Admit: 2017-09-02 | Discharge: 2017-09-02 | Disposition: A | Discharge status: Home / Self Care | Payer: No Typology Code available for payment source | Source: Other Acute Inpatient Hospital | Attending: Radiology | Admitting: Radiology

## 2017-09-02 ENCOUNTER — Ambulatory Visit
Admission: RE | Admit: 2017-09-02 | Discharge: 2017-09-02 | Disposition: A | Discharge status: Home / Self Care | Payer: No Typology Code available for payment source | Source: Ambulatory Visit | Attending: Obstetrics and Gynecology | Admitting: Obstetrics and Gynecology

## 2017-09-02 ENCOUNTER — Other Ambulatory Visit: Payer: Self-pay | Admitting: Obstetrics and Gynecology

## 2017-09-02 ENCOUNTER — Other Ambulatory Visit: Payer: No Typology Code available for payment source

## 2017-09-02 DIAGNOSIS — N611 Abscess of the breast and nipple: Secondary | ICD-10-CM

## 2017-09-07 ENCOUNTER — Ambulatory Visit
Admission: RE | Admit: 2017-09-07 | Discharge: 2017-09-07 | Disposition: A | Discharge status: Home / Self Care | Payer: No Typology Code available for payment source | Source: Ambulatory Visit | Attending: Obstetrics and Gynecology | Admitting: Obstetrics and Gynecology

## 2017-09-07 ENCOUNTER — Other Ambulatory Visit: Payer: Self-pay | Admitting: Obstetrics and Gynecology

## 2017-09-07 DIAGNOSIS — N611 Abscess of the breast and nipple: Secondary | ICD-10-CM

## 2017-09-07 LAB — AEROBIC/ANAEROBIC CULTURE W GRAM STAIN (SURGICAL/DEEP WOUND)

## 2017-09-07 MED FILL — GLIMEPIRIDE 1 MG TABLET: 1 | 30 days supply | Qty: 30 | Fill #2

## 2017-09-07 MED FILL — ?METFORMIN HCL 1,000 MG TAB: 1000 | 30 days supply | Qty: 60 | Fill #2

## 2017-09-07 MED FILL — AMOX-CLAV 875-125 MG TABLET: 875-125 | 10 days supply | Qty: 20 | Fill #0

## 2017-09-07 MED FILL — ?ATORVASTATIN 40MG TABLET: 40 | 30 days supply | Qty: 30 | Fill #2

## 2017-09-10 ENCOUNTER — Ambulatory Visit
Admission: RE | Admit: 2017-09-10 | Discharge: 2017-09-10 | Disposition: A | Discharge status: Home / Self Care | Payer: No Typology Code available for payment source | Source: Ambulatory Visit | Attending: Obstetrics and Gynecology | Admitting: Obstetrics and Gynecology

## 2017-09-10 DIAGNOSIS — N611 Abscess of the breast and nipple: Secondary | ICD-10-CM

## 2017-10-26 ENCOUNTER — Encounter (HOSPITAL_COMMUNITY): Payer: Self-pay

## 2017-10-26 ENCOUNTER — Ambulatory Visit (HOSPITAL_COMMUNITY)
Admission: RE | Admit: 2017-10-26 | Discharge: 2017-10-26 | Disposition: A | Discharge status: Home / Self Care | Payer: No Typology Code available for payment source | Source: Ambulatory Visit | Attending: Obstetrics and Gynecology | Admitting: Obstetrics and Gynecology

## 2017-10-26 VITALS — BP 102/70 | Ht <= 58 in | Wt 164.0 lb

## 2017-10-26 DIAGNOSIS — N6312 Unspecified lump in the right breast, upper inner quadrant: Secondary | ICD-10-CM

## 2017-10-26 DIAGNOSIS — Z1239 Encounter for other screening for malignant neoplasm of breast: Secondary | ICD-10-CM

## 2017-10-26 NOTE — Patient Instructions (Addendum)
Explained breast self awareness with Kimberly Wilkerson. Patient did not need a Pap smear today due to last Pap smear and HPV typing was 04/17/2015. Let her know BCCCP will cover Pap smears and HPV typing every 5 years unless has a history of abnormal Pap smears. Told patient if she develops a new lump or symptoms recur that she will need to schedule an appointment with BCCCP. [facility]. Patient advised to keep follow-up appointment.  [name] verbalized understanding.  Praise Stennett, Kathaleen Maserhristine Poll, RN 11:28 AM

## 2017-10-26 NOTE — Progress Notes (Signed)
Complaints of a healing right breast abscess since December 2018.  Pap Smear: Pap smear not completed today. [facility] and normal with negative HPV. Per patient has no history of an abnormal Pap smear. Last Pap smear result is in EPIC.  Physical exam: Breasts Right breast slightly larger than left breast. No skin abnormalities left breast. Observed a  Bruise and scabbed area from healing abscess at 3 o'clock 3 cm from the nipple. No nipple retraction bilateral breasts. No nipple discharge bilateral breasts. No lymphadenopathy. No lumps palpated left breast. Palpated a lump within the right breast at 1 o'clock 4 cm from the nipple that was noted on diagnostic imaging on 06/15/2017. No complaints of tenderness on exam. Patient scheduled to follow-up with surgeon. Patient advised to keep appointment.      Pelvic/Bimanual No Pap smear completed today since last Pap smear and HPV typing was 04/17/2015. Pap smear not indicated per BCCCP guidelines.   Smoking History: Patient has never smoked.  Patient Navigation: Patient education provided. [facility]. Spanish interpreter provided.  Used Spanish interpreter [employer] Company[employer]anca Lindner from [place].

## 2017-11-19 ENCOUNTER — Other Ambulatory Visit: Payer: Self-pay | Admitting: Physician Assistant

## 2017-11-19 DIAGNOSIS — E119 Type 2 diabetes mellitus without complications: Secondary | ICD-10-CM

## 2017-11-19 DIAGNOSIS — E782 Mixed hyperlipidemia: Secondary | ICD-10-CM

## 2017-11-19 MED FILL — ?METFORMIN HCL 1,000 MG TAB: 1000 | 30 days supply | Qty: 60 | Fill #0

## 2017-12-17 ENCOUNTER — Ambulatory Visit: Payer: No Typology Code available for payment source | Attending: Internal Medicine

## 2017-12-17 ENCOUNTER — Ambulatory Visit: Payer: No Typology Code available for payment source | Admitting: Nurse Practitioner

## 2017-12-27 ENCOUNTER — Telehealth: Payer: Self-pay

## 2017-12-27 NOTE — Telephone Encounter (Signed)
JA 

## 2017-12-30 ENCOUNTER — Other Ambulatory Visit: Payer: Self-pay | Admitting: General Surgery

## 2017-12-30 DIAGNOSIS — N611 Abscess of the breast and nipple: Secondary | ICD-10-CM

## 2017-12-30 NOTE — Telephone Encounter (Signed)
Will forward to Kimberly Wilkerson, who patient is establishing care with.

## 2017-12-31 ENCOUNTER — Ambulatory Visit
Admission: RE | Admit: 2017-12-31 | Discharge: 2017-12-31 | Disposition: A | Discharge status: Home / Self Care | Payer: No Typology Code available for payment source | Source: Ambulatory Visit | Attending: General Surgery | Admitting: General Surgery

## 2017-12-31 ENCOUNTER — Other Ambulatory Visit: Payer: Self-pay | Admitting: Nurse Practitioner

## 2017-12-31 DIAGNOSIS — N611 Abscess of the breast and nipple: Secondary | ICD-10-CM

## 2017-12-31 DIAGNOSIS — E119 Type 2 diabetes mellitus without complications: Secondary | ICD-10-CM

## 2017-12-31 MED ORDER — GLIMEPIRIDE 1 MG PO TABS: 1.0000 mg | ORAL_TABLET | Freq: Every day | ORAL | 0 refills | Status: DC

## 2017-12-31 MED ORDER — METFORMIN HCL 1000 MG PO TABS: ORAL_TABLET | ORAL | 0 refills | Status: DC

## 2017-12-31 MED FILL — metFORMIN HCL 1000 MG TABS: 1000 | 30 days supply | Qty: 60 | Fill #0

## 2017-12-31 MED FILL — GLIMEPIRIDE 1 MG TAB: 1 | 30 days supply | Qty: 30 | Fill #0

## 2017-12-31 NOTE — Telephone Encounter (Signed)
Script sent. Thank you.

## 2018-01-06 MED FILL — AMOX-CLAV 875-125 MG TABLET: 875-125 | 10 days supply | Qty: 20 | Fill #0

## 2018-01-24 ENCOUNTER — Encounter: Payer: Self-pay | Admitting: Nurse Practitioner

## 2018-01-24 ENCOUNTER — Ambulatory Visit: Payer: Self-pay | Attending: Nurse Practitioner | Admitting: Nurse Practitioner

## 2018-01-24 VITALS — BP 111/76 | HR 70 | Temp 98.9°F | Ht 59.0 in | Wt 163.0 lb

## 2018-01-24 DIAGNOSIS — Z8619 Personal history of other infectious and parasitic diseases: Secondary | ICD-10-CM | POA: Insufficient documentation

## 2018-01-24 DIAGNOSIS — E782 Mixed hyperlipidemia: Secondary | ICD-10-CM | POA: Insufficient documentation

## 2018-01-24 DIAGNOSIS — Z760 Encounter for issue of repeat prescription: Secondary | ICD-10-CM | POA: Insufficient documentation

## 2018-01-24 DIAGNOSIS — E669 Obesity, unspecified: Secondary | ICD-10-CM | POA: Insufficient documentation

## 2018-01-24 DIAGNOSIS — E119 Type 2 diabetes mellitus without complications: Secondary | ICD-10-CM | POA: Insufficient documentation

## 2018-01-24 DIAGNOSIS — Z8632 Personal history of gestational diabetes: Secondary | ICD-10-CM | POA: Insufficient documentation

## 2018-01-24 DIAGNOSIS — Z79899 Other long term (current) drug therapy: Secondary | ICD-10-CM | POA: Insufficient documentation

## 2018-01-24 DIAGNOSIS — E559 Vitamin D deficiency, unspecified: Secondary | ICD-10-CM | POA: Insufficient documentation

## 2018-01-24 DIAGNOSIS — Z833 Family history of diabetes mellitus: Secondary | ICD-10-CM | POA: Insufficient documentation

## 2018-01-24 DIAGNOSIS — Z7984 Long term (current) use of oral hypoglycemic drugs: Secondary | ICD-10-CM | POA: Insufficient documentation

## 2018-01-24 DIAGNOSIS — E1169 Type 2 diabetes mellitus with other specified complication: Secondary | ICD-10-CM

## 2018-01-24 LAB — POCT GLYCOSYLATED HEMOGLOBIN (HGB A1C): HEMOGLOBIN A1C: 7.1

## 2018-01-24 LAB — GLUCOSE, POCT (MANUAL RESULT ENTRY): POC Glucose: 110 mg/dl — AB (ref 70–99)

## 2018-01-24 MED ORDER — TRUEPLUS LANCETS 28G MISC
3 refills | Status: DC
Start: 2018-01-24 — End: 2018-08-15

## 2018-01-24 MED ORDER — ATORVASTATIN CALCIUM 40 MG PO TABS: 40.0000 mg | ORAL_TABLET | Freq: Every day | ORAL | 0 refills | Status: DC

## 2018-01-24 MED ORDER — ATORVASTATIN CALCIUM 40 MG PO TABS
40.0000 mg | ORAL_TABLET | Freq: Every day | ORAL | 0 refills | Status: DC
Start: 2018-01-24 — End: 2018-01-24

## 2018-01-24 MED ORDER — GLUCOSE BLOOD VI STRP: ORAL_STRIP | 12 refills | Status: DC

## 2018-01-24 MED ORDER — TRUE METRIX METER W/DEVICE KIT: PACK | 0 refills | Status: DC

## 2018-01-24 MED ORDER — GLIMEPIRIDE 1 MG PO TABS: 1.0000 mg | ORAL_TABLET | Freq: Every day | ORAL | 0 refills | Status: DC

## 2018-01-24 MED ORDER — METFORMIN HCL 1000 MG PO TABS: ORAL_TABLET | ORAL | 3 refills | Status: DC

## 2018-01-24 MED FILL — ATORVASTATIN CALCIUM 40 MG: 40 | 30 days supply | Qty: 30 | Fill #0

## 2018-01-24 MED FILL — metFORMIN HCL 1000 MG TABS: 1000 | 30 days supply | Qty: 60 | Fill #0

## 2018-01-24 MED FILL — GLIMEPIRIDE 1 MG TABLET: 1 | 30 days supply | Qty: 30 | Fill #0

## 2018-01-24 MED FILL — TRUEplus LANCETS 28G MISC: 30 days supply | Qty: 100 | Fill #0

## 2018-01-24 MED FILL — TRUE METRIX TEST STRIP: 30 days supply | Qty: 100 | Fill #0

## 2018-01-24 NOTE — Patient Instructions (Signed)
Diabetes blood sugar goals  Fasting in AM before breakfast which means at least 8 hrs of no eating or drinking) except water or unsweetened coffee or tea): 90-110 2 hrs after meals: < 160,   Hypoglycemia or low blood sugar: < 70 (You should not have hypoglycemia.)  Aim for 30 minutes of exercise most days. Rethink what you drink. Water is great! Aim for 2-3 Carb Choices per meal (30-45 grams) +/- 1 either way  Aim for 0-15 Carbs per snack if hungry  Include protein in moderation with your meals and snacks  Consider reading food labels for Total Carbohydrate and Fat Grams of foods  Consider checking BG at alternate times per day  Continue taking medication as directed Be mindful about how much sugar you are adding to beverages and other foods. Fruit Punch - find one with no sugar  Measure and decrease portions of carbohydrate foods  Make your plate and don't go back for seconds 
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
## 2018-01-25 ENCOUNTER — Other Ambulatory Visit: Payer: Self-pay | Admitting: Nurse Practitioner

## 2018-01-25 LAB — MICROALBUMIN / CREATININE URINE RATIO
Creatinine, Urine: 21.6 mg/dL
Microalb/Creat Ratio: <13.9 mg/g creat (ref 0.0–30.0)
Microalbumin, Urine: <3 ug/mL

## 2018-01-25 LAB — VITAMIN D 25 HYDROXY (VIT D DEFICIENCY, FRACTURES): VIT D 25 HYDROXY: 14.6 ng/mL — AB (ref 30.0–100.0)

## 2018-01-25 MED ORDER — VITAMIN D (ERGOCALCIFEROL) 1.25 MG (50000 UNIT) PO CAPS
50000.0000 [IU] | ORAL_CAPSULE | ORAL | 1 refills | Status: DC
Start: 2018-01-25 — End: 2018-01-28

## 2018-01-26 ENCOUNTER — Other Ambulatory Visit: Payer: Self-pay | Admitting: Nurse Practitioner

## 2018-01-28 ENCOUNTER — Telehealth: Payer: Self-pay

## 2018-01-28 MED ORDER — VITAMIN D (ERGOCALCIFEROL) 1.25 MG (50000 UNIT) PO CAPS: 50000.0000 [IU] | ORAL_CAPSULE | ORAL | 1 refills | Status: DC

## 2018-01-28 MED FILL — VIT D2 1.25 MG (50,000 UNIT: 1.25 MG | 28 days supply | Qty: 4 | Fill #0

## 2018-01-28 NOTE — Telephone Encounter (Signed)
-----   Message from Claiborne Rigg, NP sent at 01/26/2018  8:59 AM EDT ----- Please disregard previous note. Script was sent for vitamin d during office visit on 01-25-2018

## 2018-01-28 NOTE — Telephone Encounter (Signed)
CMA called patient to inform her lab results and Vitamin D rx sent.  Patient request to have her vitamin D send to the [facility] pharmacy.  Rx sent.  Patient understood. Spanish interpreter Byrd Hesselbach [phone] assist with the call.

## 2018-03-02 ENCOUNTER — Ambulatory Visit: Payer: No Typology Code available for payment source | Admitting: Nurse Practitioner

## 2018-03-03 MED FILL — ?ATORVASTATIN 40MG TABLET: 40 | 30 days supply | Qty: 30 | Fill #1

## 2018-03-03 MED FILL — ?METFORMIN HCL 1,000 MG TAB: 1000 | 30 days supply | Qty: 60 | Fill #1

## 2018-04-14 MED FILL — ?ATORVASTATIN 40MG TABLET: 40 | 30 days supply | Qty: 30 | Fill #2

## 2018-04-14 MED FILL — ?METFORMIN HCL 1,000 MG TAB: 1000 | 30 days supply | Qty: 60 | Fill #2

## 2018-06-18 ENCOUNTER — Other Ambulatory Visit: Payer: Self-pay

## 2018-06-18 ENCOUNTER — Emergency Department (HOSPITAL_COMMUNITY)
Admission: EM | Admit: 2018-06-18 | Discharge: 2018-06-18 | Disposition: A | Discharge status: Home / Self Care | Payer: Self-pay | Attending: Emergency Medicine | Admitting: Emergency Medicine

## 2018-06-18 ENCOUNTER — Encounter (HOSPITAL_COMMUNITY): Payer: Self-pay | Admitting: Emergency Medicine

## 2018-06-18 DIAGNOSIS — Z041 Encounter for examination and observation following transport accident: Secondary | ICD-10-CM | POA: Insufficient documentation

## 2018-06-18 DIAGNOSIS — Z79899 Other long term (current) drug therapy: Secondary | ICD-10-CM | POA: Insufficient documentation

## 2018-06-18 DIAGNOSIS — Z7984 Long term (current) use of oral hypoglycemic drugs: Secondary | ICD-10-CM | POA: Insufficient documentation

## 2018-06-18 DIAGNOSIS — E119 Type 2 diabetes mellitus without complications: Secondary | ICD-10-CM | POA: Insufficient documentation

## 2018-06-18 MED ORDER — IBUPROFEN 400 MG PO TABS
600.0000 mg | ORAL_TABLET | Freq: Once | ORAL | Status: AC
  Administered 2018-06-18: 600 mg via ORAL
  Filled 2018-06-18: qty 1

## 2018-06-18 NOTE — Discharge Instructions (Addendum)
Please read instructions below. °Apply ice to your areas of pain for 20 minutes at a time. °You can take 600 mg of Advil/ibuprofen every 6 hours as needed for pain. °Schedule an appointment with your primary care provider to follow up on your visit today. °Return to the ER for severely worsening headache, vision changes, if new numbness or tingling in your arms or legs, inability to urinate, inability to hold your bowels, or weakness in your extremities.  ° °
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
## 2018-06-18 NOTE — ED Notes (Signed)
Pt discharged from ED; instructions provided; Pt encouraged to return to ED if symptoms worsen and to f/u with PCP; Pt verbalized understanding of all instructions 
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
## 2018-07-01 MED FILL — metFORMIN HCL 1000 MG TABS: 1000 | 30 days supply | Qty: 60 | Fill #3

## 2018-07-29 ENCOUNTER — Other Ambulatory Visit: Payer: Self-pay | Admitting: Nurse Practitioner

## 2018-07-29 ENCOUNTER — Telehealth: Payer: Self-pay | Admitting: Nurse Practitioner

## 2018-07-29 ENCOUNTER — Ambulatory Visit: Payer: Self-pay | Attending: Nurse Practitioner

## 2018-07-29 DIAGNOSIS — E669 Obesity, unspecified: Principal | ICD-10-CM

## 2018-07-29 DIAGNOSIS — E782 Mixed hyperlipidemia: Secondary | ICD-10-CM

## 2018-07-29 DIAGNOSIS — E1169 Type 2 diabetes mellitus with other specified complication: Secondary | ICD-10-CM

## 2018-07-29 MED ORDER — METFORMIN HCL 1000 MG PO TABS: ORAL_TABLET | ORAL | 0 refills | Status: DC

## 2018-07-29 MED ORDER — ATORVASTATIN CALCIUM 40 MG PO TABS: 40.0000 mg | ORAL_TABLET | Freq: Every day | ORAL | 0 refills | Status: DC

## 2018-07-29 MED FILL — metFORMIN HCL 1000 MG TABS: 1000 | 30 days supply | Qty: 60 | Fill #0

## 2018-07-29 MED FILL — ATORVASTATIN CALCIUM 40 MG: 40 | 30 days supply | Qty: 30 | Fill #0

## 2018-07-29 NOTE — Telephone Encounter (Signed)
1) Medication(s) Requested (by name): -atorvastatin (LIPITOR) 40 MG tablet  -metFORMIN (GLUCOPHAGE) 1000 MG tablet   2) Pharmacy of Choice: -[facility] - [place], [place] - [address]) Special Requests:   Approved medications will be sent to the pharmacy, we will reach out if there is an issue.  Requests made after 3pm may not be addressed until the following business day!  If a patient is unsure of the name of the medication(s) please note and ask patient to call back when they are able to provide all info, do not send to responsible party until all information is available!

## 2018-08-15 ENCOUNTER — Encounter: Payer: Self-pay | Admitting: Nurse Practitioner

## 2018-08-15 ENCOUNTER — Ambulatory Visit: Payer: Self-pay | Attending: Nurse Practitioner | Admitting: Nurse Practitioner

## 2018-08-15 VITALS — BP 113/69 | HR 79 | Temp 99.1°F | Ht 59.0 in | Wt 166.2 lb

## 2018-08-15 DIAGNOSIS — E1169 Type 2 diabetes mellitus with other specified complication: Secondary | ICD-10-CM | POA: Insufficient documentation

## 2018-08-15 DIAGNOSIS — E669 Obesity, unspecified: Secondary | ICD-10-CM

## 2018-08-15 DIAGNOSIS — E6609 Other obesity due to excess calories: Secondary | ICD-10-CM | POA: Insufficient documentation

## 2018-08-15 DIAGNOSIS — E559 Vitamin D deficiency, unspecified: Secondary | ICD-10-CM | POA: Insufficient documentation

## 2018-08-15 DIAGNOSIS — Z7984 Long term (current) use of oral hypoglycemic drugs: Secondary | ICD-10-CM | POA: Insufficient documentation

## 2018-08-15 DIAGNOSIS — Z833 Family history of diabetes mellitus: Secondary | ICD-10-CM | POA: Insufficient documentation

## 2018-08-15 DIAGNOSIS — Z79899 Other long term (current) drug therapy: Secondary | ICD-10-CM | POA: Insufficient documentation

## 2018-08-15 DIAGNOSIS — E1165 Type 2 diabetes mellitus with hyperglycemia: Secondary | ICD-10-CM

## 2018-08-15 DIAGNOSIS — Z6833 Body mass index (BMI) 33.0-33.9, adult: Secondary | ICD-10-CM | POA: Insufficient documentation

## 2018-08-15 LAB — GLUCOSE, POCT (MANUAL RESULT ENTRY): POC Glucose: 161 mg/dl — AB (ref 70–99)

## 2018-08-15 LAB — POCT GLYCOSYLATED HEMOGLOBIN (HGB A1C): HEMOGLOBIN A1C: 9.4 % — AB (ref 4.0–5.6)

## 2018-08-15 MED ORDER — GLIMEPIRIDE 2 MG PO TABS: 2.0000 mg | ORAL_TABLET | Freq: Every day | ORAL | 3 refills | Status: DC

## 2018-08-15 MED ORDER — GLUCOSE BLOOD VI STRP: ORAL_STRIP | 12 refills | Status: DC

## 2018-08-15 MED ORDER — TRUEPLUS LANCETS 28G MISC: 3 refills | Status: DC

## 2018-08-15 MED FILL — GLIMEPIRIDE 2 MG TABS: 2 | 30 days supply | Qty: 30 | Fill #0

## 2018-08-15 MED FILL — TRUE METRIX TEST STRIP: 30 days supply | Qty: 100 | Fill #0

## 2018-08-15 MED FILL — TRUEplus LANCETS 28G MISC: 30 days supply | Qty: 100 | Fill #0

## 2018-08-15 NOTE — Progress Notes (Signed)
Assessment & Plan:  Kimberly Wilkerson was seen today for follow-up.  Diagnoses and all orders for this visit:  Diabetes mellitus type 2 in obese (HCC) -     Glucose (CBG) -     HgB A1c -     glimepiride (AMARYL) 2 MG tablet; Take 1 tablet (2 mg total) by mouth daily before breakfast. -     TRUEPLUS LANCETS 28G MISC; Use as instructed -     glucose blood (TRUE METRIX BLOOD GLUCOSE TEST) test strip; Use as instructed -     CBC -     CMP14+EGFR -     Lipid panel Continue blood sugar control as discussed in office today, low carbohydrate diet, and regular physical exercise as tolerated, 150 minutes per week (30 min each day, 5 days per week, or 50 min 3 days per week). Keep blood sugar logs with fasting goal of 90-130 mg/dl, post prandial (after you eat) less than 180.  For Hypoglycemia: BS <60 and Hyperglycemia BS >400; contact the clinic ASAP. Annual eye exams and foot exams are recommended.  Diabetes is poorly controlled. Advised patient to keep a fasting blood sugar log fast, 2 hours post lunch and bedtime which will be reviewed at the next office visit.   Class 1 obesity due to excess calories with serious comorbidity and body mass index (BMI) of 33.0 to 33.9 in adult Discussed diet and exercise for person with BMI >30. Instructed: You must burn more calories than you eat. Losing 5 percent of your body weight should be considered a success. In the longer term, losing more than 15 percent of your body weight and staying at this weight is an extremely good result. However, keep in mind that even losing 5 percent of your body weight leads to important health benefits, so try not to get discouraged if you're not able to lose more than this. Will recheck weight in 3-6 months.  Vitamin D deficiency -     VITAMIN D 25 Hydroxy (Vit-D Deficiency, Fractures)    Patient has been counseled on age-appropriate routine health concerns for screening and prevention. These are reviewed and up-to-date. Referrals  have been placed accordingly. Immunizations are up-to-date or declined.    Subjective:   Chief Complaint  Patient presents with  . Follow-up    Pt. is here to follow-up on diabetes.    HPI Kimberly Wilkerson 37 y.o. female presents to office today for DM. VRI was used to communicate directly with patient for the entire encounter including providing detailed patient instructions.    DM TYPE 2 A1c is worsening. Up from 7.1 to 9.4. She missed her 3 month appointment several months ago. This is her first time returning for her diabetes since May. She is monitoring her blood glucose levels after meals with postprandial averages 140-160s. She does not check fasting blood glucose levels and only monitors her blood glucose on the weekends. I have instructed her to check her blood glucose levels at least twice per day. Her weight is up and she has not been exercising. She denies any hypo or hyperglycemia. Will continue metformin, restart glimepiride and have her return in a few weeks. She is very reluctant to start insulin today. Wants to try to lose weight, start back exercising and try to lower her a1c. Lab Results  Component Value Date   HGBA1C 9.4 (A) 08/15/2018   Lab Results  Component Value Date   HGBA1C 7.1 01/24/2018    Review of Systems  Constitutional:  Negative for fever, malaise/fatigue and weight loss.  HENT: Negative.  Negative for nosebleeds.   Eyes: Negative.  Negative for blurred vision, double vision and photophobia.  Respiratory: Negative.  Negative for cough and shortness of breath.   Cardiovascular: Negative.  Negative for chest pain, palpitations and leg swelling.  Gastrointestinal: Negative.  Negative for heartburn, nausea and vomiting.  Musculoskeletal: Negative.  Negative for myalgias.  Neurological: Negative.  Negative for dizziness, focal weakness, seizures and headaches.  Psychiatric/Behavioral: Negative.  Negative for suicidal ideas.    Past Medical History:    Diagnosis Date  . Fetal demise 09/11/2012  . Gestational diabetes   . Gonorrhea 2005  . Type 2 diabetes mellitus complicating pregnancy, antepartum 06/06/2012    Past Surgical History:  Procedure Laterality Date  . NO PAST SURGERIES      Family History  Problem Relation Age of Onset  . Diabetes Mellitus II Mother     Social History Reviewed with no changes to be made today.   Outpatient Medications Prior to Visit  Medication Sig Dispense Refill  . atorvastatin (LIPITOR) 40 MG tablet Take 1 tablet (40 mg total) by mouth daily. 30 tablet 0  . Blood Glucose Monitoring Suppl (TRUE METRIX METER) w/Device KIT Use as instructed 1 kit 0  . metFORMIN (GLUCOPHAGE) 1000 MG tablet TAKE 1 TABLET BY MOUTH TWICE DAILY WITH A MEAL 60 tablet 0  . glucose blood (TRUE METRIX BLOOD GLUCOSE TEST) test strip Use as instructed 100 each 12  . TRUEPLUS LANCETS 28G MISC Use as instructed 100 each 3  . Vitamin D, Ergocalciferol, (DRISDOL) 50000 units CAPS capsule Take 1 capsule (50,000 Units total) by mouth every 7 (seven) days. (Patient not taking: Reported on 08/15/2018) 12 capsule 1   No facility-administered medications prior to visit.     No Known Allergies     Objective:    BP 113/69 (BP Location: Left Arm, Patient Position: Sitting, Cuff Size: Normal)   Pulse 79   Temp 99.1 F (37.3 C) (Oral)   Ht '4\' 11"'$  (1.499 m)   Wt 166 lb 3.2 oz (75.4 kg)   SpO2 98%   BMI 33.57 kg/m  Wt Readings from Last 3 Encounters:  08/15/18 166 lb 3.2 oz (75.4 kg)  01/24/18 163 lb (73.9 kg)  10/26/17 164 lb (74.4 kg)    Physical Exam  Constitutional: She is oriented to person, place, and time. She appears well-developed and well-nourished. She is cooperative.  HENT:  Head: Normocephalic and atraumatic.  Eyes: EOM are normal.  Neck: Normal range of motion.  Cardiovascular: Normal rate, regular rhythm and normal heart sounds. Exam reveals no gallop and no friction rub.  No murmur heard. Pulmonary/Chest:  Effort normal and breath sounds normal. No tachypnea. No respiratory distress. She has no decreased breath sounds. She has no wheezes. She has no rhonchi. She has no rales. She exhibits no tenderness.  Abdominal: Bowel sounds are normal.  Musculoskeletal: Normal range of motion. She exhibits no edema.  Neurological: She is alert and oriented to person, place, and time. Coordination normal.  Skin: Skin is warm and dry.  Psychiatric: She has a normal mood and affect. Her behavior is normal. Judgment and thought content normal.  Nursing note and vitals reviewed.      Patient has been counseled extensively about nutrition and exercise as well as the importance of adherence with medications and regular follow-up. The patient was given clear instructions to go to ER or return to medical center if symptoms  don't improve, worsen or new problems develop. The patient verbalized understanding.   Follow-up: Return in about 4 weeks (around 09/12/2018) for DM bring meter and log with Lurena Joiner then with me in 6 weeks for PAP smear.   Gildardo Pounds, FNP-BC [facility], [place]   08/15/2018, 2:55 PM

## 2018-08-16 ENCOUNTER — Other Ambulatory Visit: Payer: Self-pay | Admitting: Nurse Practitioner

## 2018-08-16 LAB — CMP14+EGFR
ALT: 70 IU/L — AB (ref 0–32)
AST: 50 IU/L — AB (ref 0–40)
Albumin/Globulin Ratio: 1.5 (ref 1.2–2.2)
Albumin: 4.7 g/dL (ref 3.5–5.5)
Alkaline Phosphatase: 95 IU/L (ref 39–117)
BUN/Creatinine Ratio: 26 — ABNORMAL HIGH (ref 9–23)
BUN: 14 mg/dL (ref 6–20)
Bilirubin Total: 0.6 mg/dL (ref 0.0–1.2)
CALCIUM: 10.2 mg/dL (ref 8.7–10.2)
CO2: 21 mmol/L (ref 20–29)
Chloride: 100 mmol/L (ref 96–106)
Creatinine, Ser: 0.54 mg/dL — ABNORMAL LOW (ref 0.57–1.00)
GFR, EST AFRICAN AMERICAN: 137 mL/min/{1.73_m2} (ref >59)
GFR, EST NON AFRICAN AMERICAN: 119 mL/min/{1.73_m2} (ref >59)
Globulin, Total: 3.2 g/dL (ref 1.5–4.5)
Glucose: 139 mg/dL — ABNORMAL HIGH (ref 65–99)
POTASSIUM: 4 mmol/L (ref 3.5–5.2)
Sodium: 140 mmol/L (ref 134–144)
TOTAL PROTEIN: 7.9 g/dL (ref 6.0–8.5)

## 2018-08-16 LAB — LIPID PANEL
CHOL/HDL RATIO: 3.7 ratio (ref 0.0–4.4)
CHOLESTEROL TOTAL: 202 mg/dL — AB (ref 100–199)
HDL: 55 mg/dL (ref >39)
LDL CALC: 93 mg/dL (ref 0–99)
TRIGLYCERIDES: 271 mg/dL — AB (ref 0–149)
VLDL Cholesterol Cal: 54 mg/dL — ABNORMAL HIGH (ref 5–40)

## 2018-08-16 LAB — VITAMIN D 25 HYDROXY (VIT D DEFICIENCY, FRACTURES): Vit D, 25-Hydroxy: 14.2 ng/mL — ABNORMAL LOW (ref 30.0–100.0)

## 2018-08-16 LAB — CBC
Hematocrit: 38.8 % (ref 34.0–46.6)
Hemoglobin: 13.7 g/dL (ref 11.1–15.9)
MCH: 31 pg (ref 26.6–33.0)
MCHC: 35.3 g/dL (ref 31.5–35.7)
MCV: 88 fL (ref 79–97)
PLATELETS: 311 10*3/uL (ref 150–450)
RBC: 4.42 x10E6/uL (ref 3.77–5.28)
RDW: 13 % (ref 12.3–15.4)
WBC: 8.8 10*3/uL (ref 3.4–10.8)

## 2018-08-16 MED ORDER — VITAMIN D (ERGOCALCIFEROL) 1.25 MG (50000 UNIT) PO CAPS: 50000.0000 [IU] | ORAL_CAPSULE | ORAL | 1 refills | Status: DC

## 2018-08-17 MED FILL — VIT D2 1.25 MG (50,000 UNIT: 1.25 MG | 35 days supply | Qty: 5 | Fill #0

## 2018-08-24 ENCOUNTER — Telehealth: Payer: Self-pay

## 2018-08-24 DIAGNOSIS — E1169 Type 2 diabetes mellitus with other specified complication: Secondary | ICD-10-CM

## 2018-08-24 DIAGNOSIS — E669 Obesity, unspecified: Principal | ICD-10-CM

## 2018-08-24 MED ORDER — TRUE METRIX METER W/DEVICE KIT
PACK | 0 refills | Status: DC
Start: 2018-08-24 — End: 2019-07-28

## 2018-08-24 NOTE — Telephone Encounter (Signed)
-----   Message from Claiborne RiggZelda W Fleming, NP sent at 08/16/2018 11:45 PM EST ----- Your vitamin D is low. I have sent in a prescription vitamin d for you to take once a week for the next 3 months. Your cholesterol levels are high. Make sure you are taking your cholesterol medicine. High levels will increase your risk for stroke or heart attack.

## 2018-08-24 NOTE — Telephone Encounter (Signed)
CMA spoke to patient to inform on results.  Pt. verified DOB.  Pt. Understood.   [place] interpreter assist with the call.  Pt. Is aware of Rx and is requesting for a new meter due to her meter is not turning on. [facility]  Pt. Stated the Glimepiride is making her feel weak and sweaty and feel likes she need to drink juice or sandwich to feel better. Pt. Would like PCP advising if she need to stop it.

## 2018-08-25 NOTE — Telephone Encounter (Signed)
She needs be seen in the office and bring her meter. She can see me or luke first available. She should also make sure she is taking the glimepiride when she takes the metformin in the morning and with a meal. NOT ON AN EMPTY STOMACH.

## 2018-09-01 NOTE — Telephone Encounter (Signed)
Left message on voicemail to return call. Interpreter assistance provided by [employer]- [phone]

## 2018-09-06 ENCOUNTER — Other Ambulatory Visit: Payer: Self-pay | Admitting: Nurse Practitioner

## 2018-09-06 ENCOUNTER — Telehealth: Payer: Self-pay | Admitting: Nurse Practitioner

## 2018-09-06 DIAGNOSIS — E782 Mixed hyperlipidemia: Secondary | ICD-10-CM

## 2018-09-06 DIAGNOSIS — E669 Obesity, unspecified: Principal | ICD-10-CM

## 2018-09-06 DIAGNOSIS — E1169 Type 2 diabetes mellitus with other specified complication: Secondary | ICD-10-CM

## 2018-09-06 MED ORDER — ATORVASTATIN CALCIUM 40 MG PO TABS: 40.0000 mg | ORAL_TABLET | Freq: Every day | ORAL | 2 refills | Status: DC

## 2018-09-06 MED FILL — ?ATORVASTATIN 40MG TABLET: 40 | 30 days supply | Qty: 30 | Fill #0

## 2018-09-06 MED FILL — ?METFORMIN HCL 1,000 MG TAB: 1000 | 30 days supply | Qty: 60 | Fill #0

## 2018-09-06 NOTE — Telephone Encounter (Signed)
1) Medication(s) Requested (by name): °atorvastatin °2) Pharmacy of Choice: ° °[facility] ° °

## 2018-09-07 NOTE — Telephone Encounter (Signed)
Would you schedule an appointment for patient with Kimberly Wilkerson per PCP note and inform patient.

## 2018-09-08 ENCOUNTER — Telehealth: Payer: Self-pay | Admitting: Nurse Practitioner

## 2018-09-08 NOTE — Telephone Encounter (Deleted)
Tried to contact patient to schedule an appointment. No answer LVM to call back.

## 2018-09-12 ENCOUNTER — Ambulatory Visit: Payer: Self-pay | Attending: Nurse Practitioner | Admitting: Pharmacist

## 2018-09-12 ENCOUNTER — Encounter: Payer: Self-pay | Admitting: Pharmacist

## 2018-09-12 DIAGNOSIS — E669 Obesity, unspecified: Secondary | ICD-10-CM | POA: Insufficient documentation

## 2018-09-12 DIAGNOSIS — E1169 Type 2 diabetes mellitus with other specified complication: Secondary | ICD-10-CM | POA: Insufficient documentation

## 2018-09-12 DIAGNOSIS — Z6833 Body mass index (BMI) 33.0-33.9, adult: Secondary | ICD-10-CM

## 2018-09-12 LAB — GLUCOSE, POCT (MANUAL RESULT ENTRY): POC Glucose: 87 mg/dl (ref 70–99)

## 2018-09-12 NOTE — Progress Notes (Signed)
    S:     No chief complaint on file.  Patient arrives in good spirits. Presents for diabetes management at the request of Zelda. Patient was referred on 08/15/18. Glimepiride was added.  Family/Social History: DM (mother), never smoker, denies alcohol use  Insurance coverage/medication affordability: self-pay  Patient reports adherence with medications.  Current diabetes medications include: metformin 1000 mg BID, glimepiride 2 mg daily  Patient reports hypoglycemic events. Gives several objective home readings in the 60s - 70s.   Patient reported dietary habits:  - Seeing a nutritionist; reports adherence to a diabetic diet  Patient-reported exercise habits:  - walks 30-40 minutes, 2-3x a week   Patient denies nocturia.  Patient denies neuropathy. Patient denies visual changes. Patient reports self foot exams.   .[email or web address]   O:  POCT glucose: 87 Home fasting CBG: 140s - 160s  2 hour post-prandial/random CBG: 54 - 110.  Lab Results  Component Value Date   HGBA1C 9.4 (A) 08/15/2018   There were no vitals filed for this visit.  Lipid Panel     Component Value Date/Time   CHOL 202 (H) 08/15/2018 1615   TRIG 271 (H) 08/15/2018 1615   HDL 55 08/15/2018 1615   CHOLHDL 3.7 08/15/2018 1615   CHOLHDL 5.0 (H) 10/21/2016 1016   VLDL 45 (H) 10/21/2016 1016   LDLCALC 93 08/15/2018 1615   Clinical ASCVD: No  The ASCVD Risk score Denman George(Goff DC Jr., et al., 2013) failed to calculate for the following reasons:   The 2013 ASCVD risk score is only valid for ages 2540 to 5879   A/P: Diabetes longstanding currently uncontrolled. Patient is able to verbalize appropriate hypoglycemia management plan. Patient is adherent with medication. Will hold her glimepiride as she is having hypoglycemia. Will continue current regimen of metformin 1g BID and reassess home values in 1 month.  -Continued metformin 1000 mg BID. -Extensively discussed pathophysiology of DM, recommended lifestyle  interventions, dietary effects on glycemic control -Counseled on s/sx of and management of hypoglycemia -Next A1C anticipated 10/2018.   ASCVD risk - primary prevention in patient with DM. Last LDL is not controlled. ASCVD risk score cannot be calculated d/t age. She is tolerating her statin well. -Continued atorvastatin 40 mg.   HM: pt due for tetanus and PNA vaccines.  - Will address at later visit.  Written patient instructions provided.  Total time in face to face counseling 15 minutes.   Follow up Clinic Visit in 1 month.    Butch PennyLuke Van Ausdall, PharmD, CPP Clinical Pharmacist [facility] [phone]

## 2018-09-12 NOTE — Patient Instructions (Addendum)
Gracias por venir a verme hoy. Por favor haga lo siguiente:   1. Contine tomando metformina 2 veces al [facility].  2. Deje de glimepirida.  3. Contine revisando el azcar en la sangre en casa.  4. Continen haciendo los cambios de estilo de vida que hemos discutido juntos durante nuestra visita. La dieta y el ejercicio juegan un papel importante en la mejora del azcar en la sangre.  5. Seguimiento con Zelda el prximo mes.   Hipoglucemia o bajo nivel de azcar en la sangre:   El nivel bajo de azcar en la sangre puede ocurrir rpidamente y puede convertirse en una emergencia si no se trata de inmediato.   Si bien esto no debera suceder con frecuencia, se puede provocar si omite una comida o no come lo suficiente. Adems, si su insulina u otros medicamentos para la diabetes se dosifican demasiado, esto puede hacer que su nivel de azcar en la sangre baje.   Las seales de advertencia de niveles bajos de azcar en la sangre incluyen:  1. Sentirse tembloroso o mareado  2. Sentirse dbil o cansado  3. Hambre excesiva  4. Sentirse ansioso o molesto  5. Sudando incluso cuando no hace ejercicio   Qu hacer si tengo un nivel bajo de azcar en la sangre?   Controle su nivel de azcar en la sangre con su medidor. Si es inferior a 70, contine con el paso 2. Tratar con 3-4 tabletas de glucosa o 3 paquetes de azcar regular. Si no estn disponibles, puedes probar un caramelo duro. Otra opcin sera beber 4 onzas de jugo de fruta o 6 onzas de refresco REGULAR. Vuelva a revisar su azcar en 15 minutos. Si todava est por debajo de 70, haga lo que hizo en el paso 2 nuevamente. Si ha vuelto a subir, contine y coma un refrigerio o una comida pequea en este momento.                          Thank you for coming to see me today. Please do the following:  1. Continue taking metformin 2 times a day. 2. Stop glimepiride.  3. Continue checking blood sugars at home. 4. Continue making  the lifestyle changes we've discussed together during our visit. Diet and exercise play a significant role in improving your blood sugars.  5. Follow-up with Zelda next month.   Hypoglycemia or low blood sugar:   Low blood sugar can happen quickly and may become an emergency if not treated right away.   While this shouldn't happen often, it can be brought upon if you skip a meal or do not eat enough. Also, if your insulin or other diabetes medications are dosed too high, this can cause your blood sugar to go to low.   Warning signs of low blood sugar include: 1. Feeling shaky or dizzy 2. Feeling weak or tired  3. Excessive hunger 4. Feeling anxious or upset  5. Sweating even when you aren't exercising  What to do if I experience low blood sugar? 1. Check your blood sugar with your meter. If lower than 70, proceed to step 2.  2. Treat with 3-4 glucose tablets or 3 packets of regular sugar. If these aren't around, you can try hard candy. Yet another option would be to drink 4 ounces of fruit juice or 6 ounces of REGULAR soda.  3. Re-check your sugar in 15 minutes. If it is still below 70, do what you did  in step 2 again. If has come back up, go ahead and eat a snack or small meal at this time.

## 2018-09-26 ENCOUNTER — Encounter: Payer: Self-pay | Admitting: Nurse Practitioner

## 2018-09-26 ENCOUNTER — Ambulatory Visit: Payer: Self-pay | Attending: Nurse Practitioner | Admitting: Nurse Practitioner

## 2018-09-26 VITALS — BP 115/76 | HR 82 | Temp 99.1°F | Resp 16 | Wt 161.4 lb

## 2018-09-26 DIAGNOSIS — Z79899 Other long term (current) drug therapy: Secondary | ICD-10-CM | POA: Insufficient documentation

## 2018-09-26 DIAGNOSIS — Z124 Encounter for screening for malignant neoplasm of cervix: Secondary | ICD-10-CM

## 2018-09-26 DIAGNOSIS — Z833 Family history of diabetes mellitus: Secondary | ICD-10-CM | POA: Insufficient documentation

## 2018-09-26 DIAGNOSIS — Z975 Presence of (intrauterine) contraceptive device: Secondary | ICD-10-CM | POA: Insufficient documentation

## 2018-09-26 DIAGNOSIS — Z7984 Long term (current) use of oral hypoglycemic drugs: Secondary | ICD-10-CM | POA: Insufficient documentation

## 2018-09-26 DIAGNOSIS — Z01419 Encounter for gynecological examination (general) (routine) without abnormal findings: Secondary | ICD-10-CM | POA: Insufficient documentation

## 2018-09-26 DIAGNOSIS — E119 Type 2 diabetes mellitus without complications: Secondary | ICD-10-CM | POA: Insufficient documentation

## 2018-09-26 NOTE — Progress Notes (Addendum)
Assessment & Plan:  Diagnoses and all orders for this visit:  Encounter for Papanicolaou smear for cervical cancer screening -     Cytology - PAP    Patient has been counseled on age-appropriate routine health concerns for screening and prevention. These are reviewed and up-to-date. Referrals have been placed accordingly. Immunizations are up-to-date or declined.    Subjective:   HPI Kimberly Wilkerson 38 y.o. female presents to office today for PAP smear.   Review of Systems  Constitutional: Negative.  Negative for chills, fever, malaise/fatigue and weight loss.  Respiratory: Negative.  Negative for cough, shortness of breath and wheezing.   Cardiovascular: Negative.  Negative for chest pain, orthopnea and leg swelling.  Gastrointestinal: Negative for abdominal pain.  Genitourinary: Negative.  Negative for flank pain.  Skin: Negative.  Negative for rash.  Psychiatric/Behavioral: Negative for suicidal ideas.    Past Medical History:  Diagnosis Date  . Fetal demise 09/11/2012  . Gestational diabetes   . Gonorrhea 2005  . Type 2 diabetes mellitus complicating pregnancy, antepartum 06/06/2012    Past Surgical History:  Procedure Laterality Date  . NO PAST SURGERIES      Family History  Problem Relation Age of Onset  . Diabetes Mellitus II Mother     Social History Reviewed with no changes to be made today.   Outpatient Medications Prior to Visit  Medication Sig Dispense Refill  . atorvastatin (LIPITOR) 40 MG tablet Take 1 tablet (40 mg total) by mouth daily. 30 tablet 2  . metFORMIN (GLUCOPHAGE) 1000 MG tablet TAKE 1 TABLET BY MOUTH TWICE DAILY WITH A MEAL 60 tablet 2  . Vitamin D, Ergocalciferol, (DRISDOL) 1.25 MG (50000 UT) CAPS capsule Take 1 capsule (50,000 Units total) by mouth every 7 (seven) days. 12 capsule 1  . Blood Glucose Monitoring Suppl (TRUE METRIX METER) w/Device KIT Use as instructed 1 kit 0  . glucose blood (TRUE METRIX BLOOD GLUCOSE TEST) test  strip Use as instructed 100 each 12  . TRUEPLUS LANCETS 28G MISC Use as instructed 100 each 3   No facility-administered medications prior to visit.     No Known Allergies     Objective:    BP 115/76   Pulse 82   Temp 99.1 F (37.3 C)   Resp 16   Wt 161 lb 6.4 oz (73.2 kg)   LMP 05/22/2018 (Approximate)   SpO2 98%   BMI 32.60 kg/m  Wt Readings from Last 3 Encounters:  09/26/18 161 lb 6.4 oz (73.2 kg)  08/15/18 166 lb 3.2 oz (75.4 kg)  01/24/18 163 lb (73.9 kg)    Physical Exam Constitutional:      Appearance: She is well-developed.  HENT:     Head: Normocephalic.  Cardiovascular:     Rate and Rhythm: Normal rate and regular rhythm.     Heart sounds: Normal heart sounds.  Pulmonary:     Effort: Pulmonary effort is normal.     Breath sounds: Normal breath sounds.  Abdominal:     General: Bowel sounds are normal.     Palpations: Abdomen is soft.     Hernia: There is no hernia in the right inguinal area or left inguinal area.  Genitourinary:    Exam position: Lithotomy position.     Pubic Area: No rash.      Labia:        Right: No rash, tenderness, lesion or injury.        Left: No rash, tenderness, lesion or injury.  Vagina: Normal. No signs of injury and foreign body. No vaginal discharge, erythema, tenderness or bleeding.     Cervix: No cervical motion tenderness or friability.     Uterus: Not deviated and not enlarged.      Adnexa:        Right: No mass, tenderness or fullness.         Left: No mass, tenderness or fullness.       Rectum: Normal. No external hemorrhoid.     Comments: IUD strings visible at entrance of OS Lymphadenopathy:     Lower Body: No right inguinal adenopathy. No left inguinal adenopathy.  Skin:    General: Skin is warm and dry.  Neurological:     Mental Status: She is alert and oriented to person, place, and time.  Psychiatric:        Behavior: Behavior normal.        Thought Content: Thought content normal.        Judgment:  Judgment normal.          Patient has been counseled extensively about nutrition and exercise as well as the importance of adherence with medications and regular follow-up. The patient was given clear instructions to go to ER or return to medical center if symptoms don't improve, worsen or new problems develop. The patient verbalized understanding.   Follow-up: Return in about 2 months (around 11/25/2018) for DM .   Gildardo Pounds, FNP-BC [facility] and [place], [place]   09/26/2018, 2:09 PM

## 2018-09-28 LAB — CYTOLOGY - PAP
BACTERIAL VAGINITIS: POSITIVE — AB
CHLAMYDIA, DNA PROBE: NEGATIVE
Candida vaginitis: NEGATIVE
DIAGNOSIS: NEGATIVE
HPV: NOT DETECTED
Neisseria Gonorrhea: NEGATIVE
Trichomonas: NEGATIVE

## 2018-09-29 MED FILL — VIT D2 1.25 MG (50,000 UNIT: 1.25 MG | 35 days supply | Qty: 5 | Fill #1

## 2018-09-29 MED FILL — ?ATORVASTATIN 40MG TABLET: 40 | 30 days supply | Qty: 30 | Fill #0

## 2018-09-29 MED FILL — ?METFORMIN HCL 1000MG TABS: 1000 | 30 days supply | Qty: 60 | Fill #1

## 2018-10-02 ENCOUNTER — Other Ambulatory Visit: Payer: Self-pay | Admitting: Nurse Practitioner

## 2018-10-02 MED ORDER — METRONIDAZOLE 500 MG PO TABS: 500.0000 mg | ORAL_TABLET | Freq: Two times a day (BID) | ORAL | 0 refills | Status: AC

## 2018-10-02 NOTE — Progress Notes (Signed)
t

## 2018-10-03 MED FILL — metroNIDAZOLE 500 MG TABS: 500 | 7 days supply | Qty: 14 | Fill #0

## 2018-10-04 ENCOUNTER — Telehealth: Payer: Self-pay | Admitting: Nurse Practitioner

## 2018-10-04 NOTE — Telephone Encounter (Signed)
Patient called back for their lab results. Patient states her phone is not working and would be reaching out again later.

## 2018-10-07 ENCOUNTER — Encounter (INDEPENDENT_AMBULATORY_CARE_PROVIDER_SITE_OTHER): Payer: Self-pay

## 2018-10-07 NOTE — Telephone Encounter (Signed)
Left message on voicemail with interpreter assistance Sue Lush [phone] Notes recorded by Claiborne Rigg, NP on 10/02/2018 at 7:09 PM EST PAP is negative for cervical cancer but positive for Bacterial vaginosis. Prescription has been sent to the pharmacy

## 2018-10-21 MED FILL — ?METFORMIN HCL 1000MG TABS: 1000 | 30 days supply | Qty: 60 | Fill #2

## 2018-11-07 MED FILL — VIT D2 1.25 MG (50,000 UNIT: 1.25 MG | 84 days supply | Qty: 12 | Fill #2

## 2018-11-07 MED FILL — ?ATORVASTATIN 40MG TABLET: 40 | 30 days supply | Qty: 30 | Fill #1

## 2018-11-25 ENCOUNTER — Encounter: Payer: Self-pay | Admitting: Nurse Practitioner

## 2018-11-25 ENCOUNTER — Ambulatory Visit: Payer: Self-pay | Attending: Nurse Practitioner | Admitting: Nurse Practitioner

## 2018-11-25 VITALS — BP 103/75 | HR 83 | Temp 99.2°F | Ht 59.0 in | Wt 158.0 lb

## 2018-11-25 DIAGNOSIS — E1169 Type 2 diabetes mellitus with other specified complication: Secondary | ICD-10-CM

## 2018-11-25 DIAGNOSIS — E782 Mixed hyperlipidemia: Secondary | ICD-10-CM

## 2018-11-25 DIAGNOSIS — E559 Vitamin D deficiency, unspecified: Secondary | ICD-10-CM

## 2018-11-25 DIAGNOSIS — E669 Obesity, unspecified: Secondary | ICD-10-CM

## 2018-11-25 LAB — GLUCOSE, POCT (MANUAL RESULT ENTRY): POC GLUCOSE: 141 mg/dL — AB (ref 70–99)

## 2018-11-25 LAB — POCT GLYCOSYLATED HEMOGLOBIN (HGB A1C): HEMOGLOBIN A1C: 6.6 % — AB (ref 4.0–5.6)

## 2018-11-25 MED ORDER — METFORMIN HCL 1000 MG PO TABS: 1000.0000 mg | ORAL_TABLET | Freq: Two times a day (BID) | ORAL | 3 refills | Status: DC

## 2018-11-25 MED ORDER — ATORVASTATIN CALCIUM 40 MG PO TABS
40.0000 mg | ORAL_TABLET | Freq: Every day | ORAL | 2 refills | Status: DC
Start: 2018-11-25 — End: 2019-03-07

## 2018-11-25 MED ORDER — GLUCOSE BLOOD VI STRP: ORAL_STRIP | 12 refills | Status: DC

## 2018-11-25 MED ORDER — TRUEPLUS LANCETS 28G MISC: 3 refills | Status: DC

## 2018-11-25 MED FILL — TRUEplus LANCETS 28G MISC: 30 days supply | Qty: 100 | Fill #0

## 2018-11-25 MED FILL — ?METFORMIN HCL 1000 MG TAB: 1000 | 30 days supply | Qty: 60 | Fill #0

## 2018-11-25 MED FILL — TRUE METRIX TEST STRIP: 30 days supply | Qty: 100 | Fill #0

## 2018-11-25 NOTE — Patient Instructions (Signed)
Vitamins you can take for ENERGY  VITAMIN B 12 VITAMIN B COMPLEX

## 2018-11-25 NOTE — Progress Notes (Signed)
Assessment & Plan:  Kimberly Wilkerson was seen today for follow-up.  Diagnoses and all orders for this visit:  Diabetes mellitus type 2 in obese (HCC) -     Glucose (CBG) -     HgB A1c -     metFORMIN (GLUCOPHAGE) 1000 MG tablet; Take 1 tablet (1,000 mg total) by mouth 2 (two) times daily with a meal for 30 days. -     TRUEplus Lancets 28G MISC; Use as instructed -     glucose blood (TRUE METRIX BLOOD GLUCOSE TEST) test strip; Use as instructed Continue blood sugar control as discussed in office today, low carbohydrate diet, and regular physical exercise as tolerated, 150 minutes per week (30 min each day, 5 days per week, or 50 min 3 days per week). Keep blood sugar logs with fasting goal of 90-130 mg/dl, post prandial (after you eat) less than 180.  For Hypoglycemia: BS <60 and Hyperglycemia BS >400; contact the clinic ASAP. Annual eye exams and foot exams are recommended.   Mixed hyperlipidemia -     atorvastatin (LIPITOR) 40 MG tablet; Take 1 tablet (40 mg total) by mouth daily. INSTRUCTIONS: Work on a low fat, heart healthy diet and participate in regular aerobic exercise program by working out at least 150 minutes per week; 5 days a week-30 minutes per day. Avoid red meat, fried foods. junk foods, sodas, sugary drinks, unhealthy snacking, alcohol and smoking.  Drink at least 48oz of water per day and monitor your carbohydrate intake daily.   Vitamin D deficiency disease -     VITAMIN D 25 Hydroxy (Vit-D Deficiency, Fractures)    Patient has been counseled on age-appropriate routine health concerns for screening and prevention. These are reviewed and up-to-date. Referrals have been placed accordingly. Immunizations are up-to-date or declined.    Subjective:   Chief Complaint  Patient presents with  . Follow-up    Pt. is here to follow-up on diabetes.    HPI Kimberly Wilkerson 38 y.o. female presents to office today fro follow up to DM. VRI was used to communicate directly with patient  for the entire encounter including providing detailed patient instructions.   DM TYPE 2 Chronic and currently well controlled. A1C down from 9.4 to 6.6. Her weight is down. Monitoring her blood glucose levels twice per day however she is only checking before breakfast and after breakfast. I have requested that she monitor her blood glucose levels fasting in am and once postprandial dinner. She denies any hypo or hyperglycemic symptoms.  Lab Results  Component Value Date   HGBA1C 6.6 (A) 11/25/2018    Review of Systems  Constitutional: Positive for malaise/fatigue. Negative for fever and weight loss.  HENT: Negative.  Negative for nosebleeds.   Eyes: Negative.  Negative for blurred vision, double vision and photophobia.  Respiratory: Negative.  Negative for cough and shortness of breath.   Cardiovascular: Negative.  Negative for chest pain, palpitations and leg swelling.  Gastrointestinal: Negative.  Negative for heartburn, nausea and vomiting.  Musculoskeletal: Negative.  Negative for myalgias.  Neurological: Negative.  Negative for dizziness, focal weakness, seizures and headaches.  Psychiatric/Behavioral: Negative.  Negative for suicidal ideas.    Past Medical History:  Diagnosis Date  . Fetal demise 09/11/2012  . Gestational diabetes   . Gonorrhea 2005  . Type 2 diabetes mellitus complicating pregnancy, antepartum 06/06/2012    Past Surgical History:  Procedure Laterality Date  . NO PAST SURGERIES      Family History  Problem Relation  Age of Onset  . Diabetes Mellitus II Mother     Social History Reviewed with no changes to be made today.   Outpatient Medications Prior to Visit  Medication Sig Dispense Refill  . Blood Glucose Monitoring Suppl (TRUE METRIX METER) w/Device KIT Use as instructed 1 kit 0  . Vitamin D, Ergocalciferol, (DRISDOL) 1.25 MG (50000 UT) CAPS capsule Take 1 capsule (50,000 Units total) by mouth every 7 (seven) days. 12 capsule 1  . atorvastatin  (LIPITOR) 40 MG tablet Take 1 tablet (40 mg total) by mouth daily. 30 tablet 2  . glucose blood (TRUE METRIX BLOOD GLUCOSE TEST) test strip Use as instructed 100 each 12  . metFORMIN (GLUCOPHAGE) 1000 MG tablet TAKE 1 TABLET BY MOUTH TWICE DAILY WITH A MEAL 60 tablet 2  . TRUEPLUS LANCETS 28G MISC Use as instructed 100 each 3   No facility-administered medications prior to visit.     No Known Allergies     Objective:    BP 103/75 (BP Location: Left Arm, Patient Position: Sitting, Cuff Size: Normal)   Pulse 83   Temp 99.2 F (37.3 C) (Oral)   Ht _0  (1.499 m)   Wt 158 lb (71.7 kg)   SpO2 97%   BMI 31.91 kg/m  Wt Readings from Last 3 Encounters:  11/25/18 158 lb (71.7 kg)  09/26/18 161 lb 6.4 oz (73.2 kg)  08/15/18 166 lb 3.2 oz (75.4 kg)    Physical Exam Vitals signs and nursing note reviewed.  Constitutional:      Appearance: She is well-developed.  HENT:     Head: Normocephalic and atraumatic.  Neck:     Musculoskeletal: Normal range of motion.  Cardiovascular:     Rate and Rhythm: Normal rate and regular rhythm.     Heart sounds: Normal heart sounds. No murmur. No friction rub. No gallop.   Pulmonary:     Effort: Pulmonary effort is normal. No tachypnea or respiratory distress.     Breath sounds: Normal breath sounds. No decreased breath sounds, wheezing, rhonchi or rales.  Chest:     Chest wall: No tenderness.  Abdominal:     General: Bowel sounds are normal.     Palpations: Abdomen is soft.  Musculoskeletal: Normal range of motion.  Skin:    General: Skin is warm and dry.  Neurological:     Mental Status: She is alert and oriented to person, place, and time.     Coordination: Coordination normal.  Psychiatric:        Behavior: Behavior normal. Behavior is cooperative.        Thought Content: Thought content normal.        Judgment: Judgment normal.          Patient has been counseled extensively about nutrition and exercise as well as the  importance of adherence with medications and regular follow-up. The patient was given clear instructions to go to ER or return to medical center if symptoms don't improve, worsen or new problems develop. The patient verbalized understanding.   Follow-up: Return in about 3 months (around 02/25/2019) for DM TYPE2.   Gildardo Pounds, FNP-BC [facility] and [facility] [place], [place]   11/25/2018, 10:02 AM

## 2018-11-26 LAB — VITAMIN D 25 HYDROXY (VIT D DEFICIENCY, FRACTURES): VIT D 25 HYDROXY: 39.6 ng/mL (ref 30.0–100.0)

## 2018-11-30 ENCOUNTER — Telehealth: Payer: Self-pay

## 2018-11-30 NOTE — Telephone Encounter (Signed)
-----   Message from Claiborne Rigg, NP sent at 11/27/2018  8:26 PM EDT ----- Vitamin D is normal. You can continue over the counter vitamin d 1000 units daily.

## 2018-11-30 NOTE — Telephone Encounter (Signed)
CMA spoke to patient to inform on results and PCP advising.   Spanish interpreter Roman [phone] assist with the call.

## 2018-12-05 MED FILL — ?ATORVASTATIN 40MG TABLET: 40 | 30 days supply | Qty: 30 | Fill #2

## 2019-01-23 MED FILL — GLIMEPIRIDE 2 MG TABS: 2 | 30 days supply | Qty: 30 | Fill #1

## 2019-01-23 MED FILL — ?METFORMIN HCL 1000 MG TAB: 1000 | 30 days supply | Qty: 60 | Fill #1

## 2019-02-10 MED FILL — TRUE METRIX TEST STRIP: 30 days supply | Qty: 100 | Fill #1

## 2019-02-10 MED FILL — ?ATORVASTATIN 40MG TABLET: 40 | 30 days supply | Qty: 30 | Fill #0

## 2019-02-10 MED FILL — TRUEplus LANCETS 28G MISC: 30 days supply | Qty: 100 | Fill #1

## 2019-03-01 MED FILL — !TRUE METRIX BLOOD GLUCOSE: 1 days supply | Qty: 1 | Fill #0

## 2019-03-01 MED FILL — metFORMIN HCL 1000 MG TABS: 1000 | 30 days supply | Qty: 60 | Fill #2

## 2019-03-03 ENCOUNTER — Ambulatory Visit: Payer: Self-pay | Admitting: Nurse Practitioner

## 2019-03-07 ENCOUNTER — Other Ambulatory Visit: Payer: Self-pay

## 2019-03-07 ENCOUNTER — Encounter: Payer: Self-pay | Admitting: Nurse Practitioner

## 2019-03-07 ENCOUNTER — Ambulatory Visit: Payer: Self-pay | Attending: Nurse Practitioner | Admitting: Nurse Practitioner

## 2019-03-07 DIAGNOSIS — E1169 Type 2 diabetes mellitus with other specified complication: Secondary | ICD-10-CM

## 2019-03-07 DIAGNOSIS — E782 Mixed hyperlipidemia: Secondary | ICD-10-CM

## 2019-03-07 DIAGNOSIS — E669 Obesity, unspecified: Secondary | ICD-10-CM

## 2019-03-07 MED ORDER — ATORVASTATIN CALCIUM 40 MG PO TABS: 40.0000 mg | ORAL_TABLET | Freq: Every day | ORAL | 2 refills | Status: DC

## 2019-03-07 MED ORDER — TRUEPLUS LANCETS 28G MISC: 3 refills | Status: DC

## 2019-03-07 MED ORDER — METFORMIN HCL 1000 MG PO TABS: 1000.0000 mg | ORAL_TABLET | Freq: Two times a day (BID) | ORAL | 3 refills | Status: DC

## 2019-03-07 NOTE — Progress Notes (Signed)
Virtual Visit via Telephone Note Due to national recommendations of social distancing due to [place] 19, telehealth visit is felt to be most appropriate for this patient at this time.  I discussed the limitations, risks, security and privacy concerns of performing an evaluation and management service by telephone and the availability of in person appointments. I also discussed with the patient that there may be a patient responsible charge related to this service. The patient expressed understanding and agreed to proceed.    I connected with Kimberly Wilkerson on 03/07/19  at   2:50 PM EDT  EDT by telephone and verified that I am speaking with the correct person using two identifiers.   Consent I discussed the limitations, risks, security and privacy concerns of performing an evaluation and management service by telephone and the availability of in person appointments. I also discussed with the patient that there may be a patient responsible charge related to this service. The patient expressed understanding and agreed to proceed.   Location of Patient: Private Residence   Location of Provider: [place] participating in Telemedicine visit: Geryl Rankins FNP-BC [place]  ID# [id number] [place] Spanish Interpreter   History of Present Illness: Telemedicine visit for: DM Follow up  DM TYPE 2 and Hyperlipidemia Chronic and well controlled. Monitoring her blood glucose levels at home 1-2 times per day. Fasting and postprandial. Average fasting 100-120; post prandial 130-140s. She endorses medication compliance today taking metformin 1000 mg BID. She is on a statin. LDL not at goal of <70. She endorses medication compliance taking atorvastatin 20mg  daily.  Lab Results  Component Value Date   LDLCALC 93 08/15/2018   Lab Results  Component Value Date   HGBA1C 6.6 (A) 11/25/2018      Past Medical History:  Diagnosis  Date  . Fetal demise 09/11/2012  . Gestational diabetes   . Gonorrhea 2005  . Type 2 diabetes mellitus complicating pregnancy, antepartum 06/06/2012    Past Surgical History:  Procedure Laterality Date  . NO PAST SURGERIES      Family History  Problem Relation Age of Onset  . Diabetes Mellitus II Mother     Social History   Socioeconomic History  . Marital status: Single    Spouse name: Not on file  . Number of children: Not on file  . Years of education: Not on file  . Highest education level: Not on file  Occupational History  . Not on file  Social Needs  . Financial resource strain: Not on file  . Food insecurity    Worry: Not on file    Inability: Not on file  . Transportation needs    Medical: Not on file    Non-medical: Not on file  Tobacco Use  . Smoking status: Never Smoker  . Smokeless tobacco: Never Used  Substance and Sexual Activity  . Alcohol use: No  . Drug use: No  . Sexual activity: Yes    Partners: Male  Lifestyle  . Physical activity    Days per week: 1 day    Minutes per session: 60 min  . Stress: Only a little  Relationships  . Social Herbalist on phone: Twice a week    Gets together: Never    Attends religious service: More than 4 times per year    Active member of club or organization: No    Attends meetings of clubs or organizations: Never  Relationship status: Never married  Other Topics Concern  . Not on file  Social History Narrative   Lives with room mate, works at [employer]     Observations/Objective: Awake, alert and oriented x 3   Review of Systems  Constitutional: Negative for fever, malaise/fatigue and weight loss.  HENT: Negative.  Negative for nosebleeds.   Eyes: Negative.  Negative for blurred vision, double vision and photophobia.  Respiratory: Negative.  Negative for cough and shortness of breath.   Cardiovascular: Negative.  Negative for chest pain, palpitations and leg swelling.  Gastrointestinal:  Negative.  Negative for heartburn, nausea and vomiting.  Musculoskeletal: Negative.  Negative for myalgias.  Neurological: Negative.  Negative for dizziness, focal weakness, seizures and headaches.  Psychiatric/Behavioral: Negative.  Negative for suicidal ideas.    Assessment and Plan: Kimberly Bayleyrma was seen today for follow-up.  Diagnoses and all orders for this visit:  Diabetes mellitus type 2 in obese (HCC) -     metFORMIN (GLUCOPHAGE) 1000 MG tablet; Take 1 tablet (1,000 mg total) by mouth 2 (two) times daily with a meal for 30 days. -     TRUEplus Lancets 28G MISC; Use as instructed Controlled Continue medications as prescribed.  Continue blood sugar control as discussed in office today, low carbohydrate diet, and regular physical exercise as tolerated, 150 minutes per week (30 min each day, 5 days per week, or 50 min 3 days per week). Keep blood sugar logs with fasting goal of 90-130 mg/dl, post prandial (after you eat) less than 180.  For Hypoglycemia: BS <60 and Hyperglycemia BS >400; contact the clinic ASAP. Annual eye exams and foot exams are recommended.   Mixed hyperlipidemia -     atorvastatin (LIPITOR) 40 MG tablet; Take 1 tablet (40 mg total) by mouth daily. INSTRUCTIONS: Work on a low fat, heart healthy diet and participate in regular aerobic exercise program by working out at least 150 minutes per week; 5 days a week-30 minutes per day. Avoid red meat, fried foods. junk foods, sodas, sugary drinks, unhealthy snacking, alcohol and smoking.  Drink at least 48oz of water per day and monitor your carbohydrate intake daily.      Follow Up Instructions Return in about 3 months (around 06/07/2019) for DM, HPL.     I discussed the assessment and treatment plan with the patient. The patient was provided an opportunity to ask questions and all were answered. The patient agreed with the plan and demonstrated an understanding of the instructions.   The patient was advised to call back or  seek an in-person evaluation if the symptoms worsen or if the condition fails to improve as anticipated.  I provided 16 minutes of non-face-to-face time during this encounter including median intraservice time, reviewing previous notes, labs, imaging, medications and explaining diagnosis and management.  Claiborne RiggZelda W Varshini Arrants, FNP-BC

## 2019-03-29 MED FILL — metFORMIN HCL 1000 MG TABS: 1000 | 30 days supply | Qty: 60 | Fill #3

## 2019-03-29 MED FILL — ?ATORVASTATIN 40MG TABLET: 40 | 30 days supply | Qty: 30 | Fill #1

## 2019-06-01 MED FILL — ?ATORVASTATIN 40MG TABLET: 40 | 30 days supply | Qty: 30 | Fill #2

## 2019-06-01 MED FILL — metFORMIN HCL 1000 MG TABS: 1000 | 30 days supply | Qty: 60 | Fill #0

## 2019-06-09 ENCOUNTER — Ambulatory Visit: Payer: Self-pay | Admitting: Nurse Practitioner

## 2019-06-16 ENCOUNTER — Ambulatory Visit: Payer: Self-pay

## 2019-06-30 ENCOUNTER — Ambulatory Visit: Payer: Self-pay | Attending: Family Medicine

## 2019-06-30 ENCOUNTER — Other Ambulatory Visit: Payer: Self-pay

## 2019-06-30 MED FILL — ?ATORVASTATIN 40MG TABLET: 40 | 30 days supply | Qty: 30 | Fill #3

## 2019-06-30 MED FILL — metFORMIN HCL 1000 MG TABS: 1000 | 30 days supply | Qty: 60 | Fill #1

## 2019-07-27 ENCOUNTER — Other Ambulatory Visit: Payer: Self-pay | Admitting: *Deleted

## 2019-07-28 ENCOUNTER — Encounter: Payer: Self-pay | Admitting: Nurse Practitioner

## 2019-07-28 ENCOUNTER — Other Ambulatory Visit: Payer: Self-pay

## 2019-07-28 ENCOUNTER — Ambulatory Visit: Payer: Self-pay | Attending: Nurse Practitioner | Admitting: Nurse Practitioner

## 2019-07-28 VITALS — BP 112/77 | HR 62 | Temp 98.1°F | Resp 17 | Wt 161.4 lb

## 2019-07-28 DIAGNOSIS — E119 Type 2 diabetes mellitus without complications: Secondary | ICD-10-CM

## 2019-07-28 DIAGNOSIS — E785 Hyperlipidemia, unspecified: Secondary | ICD-10-CM

## 2019-07-28 DIAGNOSIS — Z130 Encounter for screening for diseases of the blood and blood-forming organs and certain disorders involving the immune mechanism: Secondary | ICD-10-CM

## 2019-07-28 LAB — POCT GLYCOSYLATED HEMOGLOBIN (HGB A1C): Hemoglobin A1C: 9.3 % — AB (ref 4.0–5.6)

## 2019-07-28 MED ORDER — TRUE METRIX BLOOD GLUCOSE TEST VI STRP: ORAL_STRIP | 12 refills | Status: DC

## 2019-07-28 MED ORDER — ATORVASTATIN CALCIUM 40 MG PO TABS: 40.0000 mg | ORAL_TABLET | Freq: Every day | ORAL | 2 refills | Status: DC

## 2019-07-28 MED ORDER — TRUEPLUS LANCETS 28G MISC: 3 refills | Status: DC

## 2019-07-28 MED ORDER — METFORMIN HCL 1000 MG PO TABS: 1000.0000 mg | ORAL_TABLET | Freq: Two times a day (BID) | ORAL | 3 refills | Status: DC

## 2019-07-28 MED FILL — TRUEplus LANCETS 28G MISC: 25 days supply | Qty: 100 | Fill #0

## 2019-07-28 MED FILL — TRUE METRIX TEST STRIP: 25 days supply | Qty: 100 | Fill #0

## 2019-07-28 MED FILL — ?ATORVASTATIN 40MG TABLET: 40 | 30 days supply | Qty: 30 | Fill #4

## 2019-07-28 MED FILL — metFORMIN HCL 1000 MG TABS: 1000 | 30 days supply | Qty: 60 | Fill #2

## 2019-07-28 NOTE — Progress Notes (Signed)
Gildardo Pounds, MSN, FNP-BC [facility] and [address], [place], [place] [address] [phone]  Assessment & Plan:  Kimberly Wilkerson was seen today for diabetes and hyperlipidemia.  Diagnoses and all orders for this visit:  Type 2 diabetes mellitus without complication, without long-term current use of insulin (HCC) -     HgB A1c -     Ambulatory referral to Ophthalmology -     urine micro -     CMP14+EGFR -     metFORMIN (GLUCOPHAGE) 1000 MG tablet; Take 1 tablet (1,000 mg total) by mouth 2 (two) times daily with a meal. -     glucose blood (TRUE METRIX BLOOD GLUCOSE TEST) test strip; Use as instructed -     TRUEplus Lancets 28G MISC; Use as instructed Continue blood sugar control as discussed in office today, low carbohydrate diet, and regular physical exercise as tolerated, 150 minutes per week (30 min each day, 5 days per week, or 50 min 3 days per week). Keep blood sugar logs with fasting goal of 90-130 mg/dl, post prandial (after you eat) less than 180.  For Hypoglycemia: BS <60 and Hyperglycemia BS >400; contact the clinic ASAP. Annual eye exams and foot exams are recommended.  Hyperlipidemia LDL goal <70 -     Lipid Panel -     atorvastatin (LIPITOR) 40 MG tablet; Take 1 tablet (40 mg total) by mouth daily. INSTRUCTIONS: Work on a low fat, heart healthy diet and participate in regular aerobic exercise program by working out at least 150 minutes per week; 5 days a week-30 minutes per day. Avoid red meat/beef/steak,  fried foods. junk foods, sodas, sugary drinks, unhealthy snacking, alcohol and smoking.  Drink at least 80 oz of water per day and monitor your carbohydrate intake daily.    Screening for deficiency anemia -     CBC    Patient has been counseled on age-appropriate routine health concerns for screening and prevention. These are reviewed and up-to-date. Referrals have been placed accordingly. Immunizations are up-to-date or declined.    Subjective:   Chief  Complaint  Patient presents with  . Diabetes  . Hyperlipidemia   HPI Kimberly Wilkerson 38 y.o. female presents to office today for DM follow up.  has a past medical history of Fetal demise (09/11/2012), Gestational diabetes, Gonorrhea (2005), and Type 2 diabetes mellitus complicating pregnancy, antepartum (06/06/2012).   VRI was used to communicate directly with patient for the entire encounter including providing detailed patient instructions.   DM TYPE 2 Poorly controlled. She stopped eating vegetables several months ago statingshe heard they had all been contaminated. Eating more carbs instead. She is monitoring her blood sugars fasting and postprandial: average postprandial 180s. She is refusing to take an additional diabetic agent today. States she will change her diet and she only wants to take metformin 1000 mg BID at this time as she did not like the way glyburide and glimepiride made her feel. I have instructed her that she will need to return in 3-4 weeks with her meter and log. If blood glucose levels are still elevated she will need to start GLP injectable. She denies any hypo or hyperglycemic symptoms.  Lab Results  Component Value Date   HGBA1C 9.3 (A) 07/28/2019   Lab Results  Component Value Date   HGBA1C 6.6 (A) 11/25/2018    Hyperlipidemia Patient presents for follow up to hyperlipidemia. She is not diet or exercise compliant.  She is medication compliant taking atorvastatin 40 mg  daily. She denies statin intolerance including myalgias.  Lab Results  Component Value Date   CHOL 202 (H) 08/15/2018   Lab Results  Component Value Date   HDL 55 08/15/2018   Lab Results  Component Value Date   LDLCALC 93 08/15/2018   Lab Results  Component Value Date   TRIG 271 (H) 08/15/2018   Lab Results  Component Value Date   CHOLHDL 3.7 08/15/2018   Review of Systems  Constitutional: Negative for fever, malaise/fatigue and weight loss.  HENT: Negative.  Negative for  nosebleeds.   Eyes: Negative.  Negative for blurred vision, double vision and photophobia.  Respiratory: Negative.  Negative for cough and shortness of breath.   Cardiovascular: Negative.  Negative for chest pain, palpitations and leg swelling.  Gastrointestinal: Negative.  Negative for heartburn, nausea and vomiting.  Musculoskeletal: Negative.  Negative for myalgias.  Neurological: Negative.  Negative for dizziness, focal weakness, seizures and headaches.  Psychiatric/Behavioral: Negative.  Negative for suicidal ideas.    Past Medical History:  Diagnosis Date  . Fetal demise 09/11/2012  . Gestational diabetes   . Gonorrhea 2005  . Type 2 diabetes mellitus complicating pregnancy, antepartum 06/06/2012    Past Surgical History:  Procedure Laterality Date  . NO PAST SURGERIES      Family History  Problem Relation Age of Onset  . Diabetes Mellitus II Mother     Social History Reviewed with no changes to be made today.   Outpatient Medications Prior to Visit  Medication Sig Dispense Refill  . atorvastatin (LIPITOR) 40 MG tablet Take 1 tablet (40 mg total) by mouth daily. 90 tablet 2  . glucose blood (TRUE METRIX BLOOD GLUCOSE TEST) test strip Use as instructed 100 each 12  . metFORMIN (GLUCOPHAGE) 1000 MG tablet Take 1 tablet (1,000 mg total) by mouth 2 (two) times daily with a meal for 30 days. 60 tablet 3  . TRUEplus Lancets 28G MISC Use as instructed 100 each 3  . Blood Glucose Monitoring Suppl (TRUE METRIX METER) w/Device KIT Use as instructed 1 kit 0  . Vitamin D, Ergocalciferol, (DRISDOL) 1.25 MG (50000 UT) CAPS capsule Take 1 capsule (50,000 Units total) by mouth every 7 (seven) days. (Patient not taking: Reported on 03/07/2019) 12 capsule 1   No facility-administered medications prior to visit.     No Known Allergies     Objective:    BP 112/77   Pulse 62   Temp 98.1 F (36.7 C) (Temporal)   Resp 17   Wt 161 lb 6.4 oz (73.2 kg)   SpO2 96%   BMI 32.60 kg/m   Wt Readings from Last 3 Encounters:  07/28/19 161 lb 6.4 oz (73.2 kg)  11/25/18 158 lb (71.7 kg)  09/26/18 161 lb 6.4 oz (73.2 kg)    Physical Exam Vitals signs and nursing note reviewed.  Constitutional:      Appearance: She is well-developed.  HENT:     Head: Normocephalic and atraumatic.  Neck:     Musculoskeletal: Normal range of motion.  Cardiovascular:     Rate and Rhythm: Normal rate and regular rhythm.     Heart sounds: Normal heart sounds. No murmur. No friction rub. No gallop.   Pulmonary:     Effort: Pulmonary effort is normal. No tachypnea or respiratory distress.     Breath sounds: Normal breath sounds. No decreased breath sounds, wheezing, rhonchi or rales.  Chest:     Chest wall: No tenderness.  Abdominal:  General: Bowel sounds are normal.     Palpations: Abdomen is soft.  Musculoskeletal: Normal range of motion.  Skin:    General: Skin is warm and dry.  Neurological:     Mental Status: She is alert and oriented to person, place, and time.     Coordination: Coordination normal.  Psychiatric:        Behavior: Behavior normal. Behavior is cooperative.        Thought Content: Thought content normal.        Judgment: Judgment normal.          Patient has been counseled extensively about nutrition and exercise as well as the importance of adherence with medications and regular follow-up. The patient was given clear instructions to go to ER or return to medical center if symptoms don't improve, worsen or new problems develop. The patient verbalized understanding.   Follow-up: Return in about 3 weeks (around 08/18/2019) for [place] in 3-4 weeks; see me in 3 months.   Gildardo Pounds, FNP-BC [facility] and [place], [place]   07/28/2019, 5:19 PM

## 2019-07-28 NOTE — Progress Notes (Signed)
Following up on DM/Chol.  States that blood sugar readings at home have been in the 140s-160s in the afternoons.  Denies nausea, vomiting, polyuria, polydipsia, numbness/tingling in her feet.  Would like to get flu shot today.

## 2019-07-29 LAB — CBC
Hematocrit: 38.4 % (ref 34.0–46.6)
Hemoglobin: 13.3 g/dL (ref 11.1–15.9)
MCH: 31.1 pg (ref 26.6–33.0)
MCHC: 34.6 g/dL (ref 31.5–35.7)
MCV: 90 fL (ref 79–97)
Platelets: 270 10*3/uL (ref 150–450)
RBC: 4.27 x10E6/uL (ref 3.77–5.28)
RDW: 12.2 % (ref 11.7–15.4)
WBC: 8.7 10*3/uL (ref 3.4–10.8)

## 2019-07-29 LAB — CMP14+EGFR
ALT: 36 IU/L — ABNORMAL HIGH (ref 0–32)
AST: 28 IU/L (ref 0–40)
Albumin/Globulin Ratio: 1.5 (ref 1.2–2.2)
Albumin: 4.6 g/dL (ref 3.8–4.8)
Alkaline Phosphatase: 85 IU/L (ref 39–117)
BUN/Creatinine Ratio: 18 (ref 9–23)
BUN: 11 mg/dL (ref 6–24)
Bilirubin Total: 0.7 mg/dL (ref 0.0–1.2)
CO2: 23 mmol/L (ref 20–29)
Calcium: 9.8 mg/dL (ref 8.7–10.2)
Chloride: 100 mmol/L (ref 96–106)
Creatinine, Ser: 0.6 mg/dL (ref 0.57–1.00)
GFR calc Af Amer: 132 mL/min/{1.73_m2} (ref >59)
GFR calc non Af Amer: 114 mL/min/{1.73_m2} (ref >59)
Globulin, Total: 3 g/dL (ref 1.5–4.5)
Glucose: 208 mg/dL — ABNORMAL HIGH (ref 65–99)
Potassium: 3.7 mmol/L (ref 3.5–5.2)
Sodium: 136 mmol/L (ref 134–144)
Total Protein: 7.6 g/dL (ref 6.0–8.5)

## 2019-07-29 LAB — MICROALBUMIN / CREATININE URINE RATIO
Creatinine, Urine: 31.7 mg/dL
Microalb/Creat Ratio: <9 mg/g creat (ref 0–29)
Microalbumin, Urine: <3 ug/mL

## 2019-07-29 LAB — LIPID PANEL
Chol/HDL Ratio: 3.4 ratio (ref 0.0–4.4)
Cholesterol, Total: 171 mg/dL (ref 100–199)
HDL: 51 mg/dL (ref >39)
LDL Chol Calc (NIH): 71 mg/dL (ref 0–99)
Triglycerides: 307 mg/dL — ABNORMAL HIGH (ref 0–149)
VLDL Cholesterol Cal: 49 mg/dL — ABNORMAL HIGH (ref 5–40)

## 2019-08-01 ENCOUNTER — Telehealth: Payer: Self-pay | Admitting: Nurse Practitioner

## 2019-08-01 NOTE — Telephone Encounter (Signed)
Patient returned call regarding her lab results. Please f/u  °

## 2019-08-03 NOTE — Telephone Encounter (Signed)
Informed of results.  

## 2019-08-04 ENCOUNTER — Telehealth (INDEPENDENT_AMBULATORY_CARE_PROVIDER_SITE_OTHER): Payer: Self-pay

## 2019-08-04 NOTE — Telephone Encounter (Signed)
-----   Message from Gildardo Pounds, NP sent at 07/29/2019  9:18 PM EST ----- Cholesterol levels are elevated which increases your risk of heart attack and stroke. Make sure you are taking your atorvastatin cholesterol medication daily. There is no anemia and kidney, liver function are normal.

## 2019-08-04 NOTE — Telephone Encounter (Signed)
Call placed using pacific interpreter [phone]) patient is aware that cholesterol levels are elevated which increases risk of stroke and heart attack. Advised patient to ensure she is taking her cholesterol medication every day. She is aware that she is not anemic and that her kidney, and liver functions are normal. She expressed understanding of results. Nat Christen, CMA

## 2019-08-21 ENCOUNTER — Ambulatory Visit: Payer: Self-pay | Admitting: Pharmacist

## 2019-09-04 ENCOUNTER — Other Ambulatory Visit: Payer: Self-pay

## 2019-09-04 ENCOUNTER — Ambulatory Visit: Payer: Self-pay | Attending: Nurse Practitioner | Admitting: Pharmacist

## 2019-09-04 DIAGNOSIS — E119 Type 2 diabetes mellitus without complications: Secondary | ICD-10-CM

## 2019-09-04 LAB — POCT CBG (FASTING - GLUCOSE)-MANUAL ENTRY: Glucose Fasting, POC: 131 mg/dL — AB (ref 70–99)

## 2019-09-04 MED FILL — metFORMIN HCL 1000 MG TABS: 1000 | 30 days supply | Qty: 60 | Fill #3

## 2019-09-04 MED FILL — ?ATORVASTATIN 40MG TABLET: 40 | 30 days supply | Qty: 30 | Fill #5

## 2019-09-04 NOTE — Progress Notes (Signed)
    S:    PCP: Zelda  No chief complaint on file.  Patient arrives in good spirits.  Presents for diabetes evaluation, education, and management.  Patient was referred and last seen by Primary Care Provider on Zelda.    Family/Social History:  - FHx: DM (mother) - Tobacco: never smoker - Alcohol: denies   Insurance coverage/medication affordability: self pay   Patient reports adherence with medications.  Current diabetes medications include: metformin 1000 mg BID Current hypertension medications include: none  Current hyperlipidemia medications include: atorvastatin 40 mg  Patient denies hypoglycemic events.  Patient reported dietary habits:  - Pt reports cutting out bread, potatoes, and rice  Patient-reported exercise habits:  - Patient reports increasing her exercise since last PCP visit - Reports exercising daily    Patient denies nocturia (nighttime urination).  Patient denies neuropathy (nerve pain). Patient denies visual changes. Patient reports self foot exams.     O:  POCT glucose: 131   Lab Results  Component Value Date   HGBA1C 9.3 (A) 07/28/2019   There were no vitals filed for this visit.  Lipid Panel     Component Value Date/Time   CHOL 171 07/28/2019 1643   TRIG 307 (H) 07/28/2019 1643   HDL 51 07/28/2019 1643   CHOLHDL 3.4 07/28/2019 1643   CHOLHDL 5.0 (H) 10/21/2016 1016   VLDL 45 (H) 10/21/2016 1016   LDLCALC 71 07/28/2019 1643   No meter; readings are reported Home fasting blood sugars: 117 - 140  2 hour post-meal/random blood sugars: 121 - 130.  Clinical Atherosclerotic Cardiovascular Disease (ASCVD): No  The 10-year ASCVD risk score Mikey Bussing DC Jr., et al., 2013) is: 0.7%   Values used to calculate the score:     Age: 38 years     Sex: Female     Is Non-Hispanic African American: No     Diabetic: Yes     Tobacco smoker: No     Systolic Blood Pressure: 161 mmHg     Is BP treated: No     HDL Cholesterol: 51 mg/dL     Total  Cholesterol: 171 mg/dL   A/P: Diabetes longstanding currently uncontrolled based on A1c but home CBGs  Reveal improvement. Patient is able to verbalize appropriate hypoglycemia management plan. Patient is adherent with medication. -Continue current regimen.  -Extensively discussed pathophysiology of diabetes, recommended lifestyle interventions, dietary effects on blood sugar control -Counseled on s/sx of and management of hypoglycemia -Next A1C anticipated 10/2019.   ASCVD risk - primary prevention in patient with diabetes. Last LDL is controlled. ASCVD risk score is not >20%  - continue current statin medication. -Continued atorvastatin 40 mg.   HM: UTD on PNA vaccine. Deferred tetanus and flu vaccine.   Written patient instructions provided.  Total time in face to face counseling 15 minutes.   Follow up Pharmacist Clinic Visit in 1 month to reassess.  Benard Halsted, PharmD, [place] [phone]

## 2019-09-04 NOTE — Patient Instructions (Signed)
Thank you for coming to see me today. Please do the following:  1. Continue medications.  2. Vitamin D and Vitamin B12 over the counter are okay for you to take.  3. Continue checking blood sugars at home.  4. Continue making the lifestyle changes we've discussed together during our visit. Diet and exercise play a significant role in improving your blood sugars.  5. Follow-up with me in 1 month.   Hypoglycemia or low blood sugar:   Low blood sugar can happen quickly and may become an emergency if not treated right away.   While this shouldn't happen often, it can be brought upon if you skip a meal or do not eat enough. Also, if your insulin or other diabetes medications are dosed too high, this can cause your blood sugar to go to low.   Warning signs of low blood sugar include: 1. Feeling shaky or dizzy 2. Feeling weak or tired  3. Excessive hunger 4. Feeling anxious or upset  5. Sweating even when you aren't exercising  What to do if I experience low blood sugar? 1. Check your blood sugar with your meter. If lower than 70, proceed to step 2.  2. Treat with 3-4 glucose tablets or 3 packets of regular sugar. If these aren't around, you can try hard candy. Yet another option would be to drink 4 ounces of fruit juice or 6 ounces of REGULAR soda.  3. Re-check your sugar in 15 minutes. If it is still below 70, do what you did in step 2 again. If has come back up, go ahead and eat a snack or small meal at this time.

## 2019-09-05 ENCOUNTER — Encounter: Payer: Self-pay | Admitting: Pharmacist

## 2019-10-05 ENCOUNTER — Ambulatory Visit: Payer: Self-pay | Attending: Nurse Practitioner | Admitting: Pharmacist

## 2019-10-05 ENCOUNTER — Other Ambulatory Visit: Payer: Self-pay

## 2019-10-05 DIAGNOSIS — E119 Type 2 diabetes mellitus without complications: Secondary | ICD-10-CM

## 2019-10-05 NOTE — Progress Notes (Signed)
    S:    PCP: Zelda  No chief complaint on file.  Virtual Visit via Telephone Note  I connected with Kimberly Wilkerson on1/14/2021at2:00 PMby telephonedue to the COVID-19 pandemic and verified that I am speaking with the correct person using two identifiers.  Consent: I discussed the limitations, risks, security and privacy concerns of performing an evaluation and management service by telephone and the availability of in person appointments.Pt was agreeable to proceed.  Location of Patient: Home  Location of Provider: Clinic  Persons participating in Telemedicine visit: Patient Myself  Patient in good spirits.  Presents for diabetes evaluation, education, and management.  Patient was referred and last seen by Primary Care Provider on 07/28/19. I saw her on 09/04/19 and made no changes.   Family/Social History:  - FHx: DM (mother) - Tobacco: never smoker - Alcohol: denies   Insurance coverage/medication affordability: self pay   Patient reports adherence with medications.  Current diabetes medications include: metformin 1000 mg BID Current hypertension medications include: none  Current hyperlipidemia medications include: atorvastatin 40 mg  Patient denies hypoglycemic events.  Patient reported dietary habits:  - Pt reports cutting out bread, potatoes, and rice  Patient-reported exercise habits:  - Reports exercising daily    Patient denies nocturia (nighttime urination).  Patient denies neuropathy (nerve pain). Patient denies visual changes. Patient reports self foot exams.    O:  POCT glucose: 135 (checked after breakfast this AM)   Lab Results  Component Value Date   HGBA1C 9.3 (A) 07/28/2019   There were no vitals filed for this visit.  Lipid Panel     Component Value Date/Time   CHOL 171 07/28/2019 1643   TRIG 307 (H) 07/28/2019 1643   HDL 51 07/28/2019 1643   CHOLHDL 3.4 07/28/2019 1643   CHOLHDL 5.0 (H) 10/21/2016 1016   VLDL  45 (H) 10/21/2016 1016   LDLCALC 71 07/28/2019 1643   Home fasting blood sugars: 106 - 130 2 hour post-meal/random blood sugars: 130 - 140  Clinical Atherosclerotic Cardiovascular Disease (ASCVD): No  The 10-year ASCVD risk score Denman George DC Jr., et al., 2013) is: 0.7%   Values used to calculate the score:     Age: 18 years     Sex: Female     Is Non-Hispanic African American: No     Diabetic: Yes     Tobacco smoker: No     Systolic Blood Pressure: 112 mmHg     Is BP treated: No     HDL Cholesterol: 51 mg/dL     Total Cholesterol: 171 mg/dL   A/P: Diabetes longstanding currently uncontrolled based on A1c but home CBGs  Reveal improvement. Patient is able to verbalize appropriate hypoglycemia management plan. Patient is adherent with medication. -Continue current regimen.  -Extensively discussed pathophysiology of diabetes, recommended lifestyle interventions, dietary effects on blood sugar control -Counseled on s/sx of and management of hypoglycemia -Next A1C anticipated 10/2019.   ASCVD risk - primary prevention in patient with diabetes. Last LDL is controlled. ASCVD risk score is not >20%  - continue current statin medication. -Continued atorvastatin 40 mg.   HM: UTD on PNA vaccine. Deferred tetanus and flu vaccine.   Written patient instructions provided.  Total time counseling over the phone: 15 minutes.   Follow up w/ PCP for A1c next month.   Butch Penny, PharmD, CPP Clinical Pharmacist [facility] [phone]

## 2019-10-12 MED FILL — ATORVASTATIN CALCIUM 40 MG: 40 | 30 days supply | Qty: 30 | Fill #6

## 2019-10-12 MED FILL — metFORMIN HCL 1000 MG TABS: 1000 | 30 days supply | Qty: 60 | Fill #0

## 2019-11-08 MED FILL — ATORVASTATIN CALCIUM 40 MG: 40 | 30 days supply | Qty: 30 | Fill #7

## 2019-11-08 MED FILL — metFORMIN HCL 1000 MG TABS: 1000 | 30 days supply | Qty: 60 | Fill #1

## 2019-12-26 MED FILL — ?ATORVASTATIN 40MG TABLET: 40 | 90 days supply | Qty: 90 | Fill #0

## 2019-12-26 MED FILL — metFORMIN HCL 1000 MG TABS: 1000 | 30 days supply | Qty: 60 | Fill #2

## 2020-02-16 MED FILL — metFORMIN HCL 1000 MG TABS: 1000 | 30 days supply | Qty: 60 | Fill #3

## 2020-04-01 ENCOUNTER — Other Ambulatory Visit: Payer: Self-pay | Admitting: Nurse Practitioner

## 2020-04-01 ENCOUNTER — Ambulatory Visit: Payer: Self-pay | Attending: Nurse Practitioner

## 2020-04-01 ENCOUNTER — Telehealth: Payer: Self-pay | Admitting: Nurse Practitioner

## 2020-04-01 ENCOUNTER — Other Ambulatory Visit: Payer: Self-pay

## 2020-04-01 DIAGNOSIS — E119 Type 2 diabetes mellitus without complications: Secondary | ICD-10-CM

## 2020-04-01 DIAGNOSIS — Z1159 Encounter for screening for other viral diseases: Secondary | ICD-10-CM

## 2020-04-01 DIAGNOSIS — E785 Hyperlipidemia, unspecified: Secondary | ICD-10-CM

## 2020-04-01 MED ORDER — METFORMIN HCL 1000 MG PO TABS: 1000.0000 mg | ORAL_TABLET | Freq: Two times a day (BID) | ORAL | 0 refills | Status: DC

## 2020-04-01 MED FILL — metFORMIN HCL 1000 MG TABS: 1000 | 30 days supply | Qty: 60 | Fill #0

## 2020-04-01 MED FILL — ?ATORVASTATIN 40MG TABLET: 40 | 90 days supply | Qty: 90 | Fill #1

## 2020-04-01 NOTE — Telephone Encounter (Signed)
1) Medication(s) Requested (by name):  metFORMIN (GLUCOPHAGE) 1000 MG tablet  & cholesterol medicine     2) Pharmacy of Choice:   [facility]    3) Special Requests:   Approved medications will be sent to the pharmacy, we will reach out if there is an issue.  Requests made after 3pm may not be addressed until the following business day!  If a patient is unsure of the name of the medication(s) please note and ask patient to call back when they are able to provide all info, do not send to responsible party until all information is available!

## 2020-04-01 NOTE — Telephone Encounter (Signed)
Patient is overdue for an appointment. She has one scheduled with our NP at the end of this month. I will send a 30-day supply to last until she can be seen.

## 2020-04-02 ENCOUNTER — Other Ambulatory Visit: Payer: Self-pay | Admitting: Nurse Practitioner

## 2020-04-02 LAB — CMP14+EGFR
ALT: 24 IU/L (ref 0–32)
AST: 20 IU/L (ref 0–40)
Albumin/Globulin Ratio: 1.4 (ref 1.2–2.2)
Albumin: 4.5 g/dL (ref 3.8–4.8)
Alkaline Phosphatase: 81 IU/L (ref 48–121)
BUN/Creatinine Ratio: 23 (ref 9–23)
BUN: 11 mg/dL (ref 6–24)
Bilirubin Total: 0.7 mg/dL (ref 0.0–1.2)
CO2: 21 mmol/L (ref 20–29)
Calcium: 9.4 mg/dL (ref 8.7–10.2)
Chloride: 101 mmol/L (ref 96–106)
Creatinine, Ser: 0.47 mg/dL — ABNORMAL LOW (ref 0.57–1.00)
GFR calc Af Amer: 143 mL/min/{1.73_m2} (ref >59)
GFR calc non Af Amer: 124 mL/min/{1.73_m2} (ref >59)
Globulin, Total: 3.2 g/dL (ref 1.5–4.5)
Glucose: 173 mg/dL — ABNORMAL HIGH (ref 65–99)
Potassium: 3.8 mmol/L (ref 3.5–5.2)
Sodium: 138 mmol/L (ref 134–144)
Total Protein: 7.7 g/dL (ref 6.0–8.5)

## 2020-04-02 LAB — CBC
Hematocrit: 37.4 % (ref 34.0–46.6)
Hemoglobin: 13.1 g/dL (ref 11.1–15.9)
MCH: 31.1 pg (ref 26.6–33.0)
MCHC: 35 g/dL (ref 31.5–35.7)
MCV: 89 fL (ref 79–97)
Platelets: 269 10*3/uL (ref 150–450)
RBC: 4.21 x10E6/uL (ref 3.77–5.28)
RDW: 12.7 % (ref 11.7–15.4)
WBC: 7.7 10*3/uL (ref 3.4–10.8)

## 2020-04-02 LAB — HEPATITIS C ANTIBODY: Hep C Virus Ab: <0.1 s/co ratio (ref 0.0–0.9)

## 2020-04-02 LAB — LIPID PANEL
Chol/HDL Ratio: 3.2 ratio (ref 0.0–4.4)
Cholesterol, Total: 190 mg/dL (ref 100–199)
HDL: 60 mg/dL (ref >39)
LDL Chol Calc (NIH): 107 mg/dL — ABNORMAL HIGH (ref 0–99)
Triglycerides: 129 mg/dL (ref 0–149)
VLDL Cholesterol Cal: 23 mg/dL (ref 5–40)

## 2020-04-02 LAB — HEMOGLOBIN A1C
Est. average glucose Bld gHb Est-mCnc: 206 mg/dL
Hgb A1c MFr Bld: 8.8 % — ABNORMAL HIGH (ref 4.8–5.6)

## 2020-04-02 MED ORDER — GLIMEPIRIDE 4 MG PO TABS
4.0000 mg | ORAL_TABLET | Freq: Every day | ORAL | 3 refills | Status: DC
Start: 2020-04-02 — End: 2020-04-16

## 2020-04-03 MED FILL — GLIMEPIRIDE 4 MG TABS: 4 | 30 days supply | Qty: 30 | Fill #0

## 2020-04-10 ENCOUNTER — Ambulatory Visit: Payer: Self-pay

## 2020-04-10 ENCOUNTER — Other Ambulatory Visit: Payer: Self-pay

## 2020-04-16 ENCOUNTER — Ambulatory Visit: Payer: Self-pay | Attending: Family | Admitting: Family

## 2020-04-16 ENCOUNTER — Encounter: Payer: Self-pay | Admitting: Family

## 2020-04-16 ENCOUNTER — Other Ambulatory Visit: Payer: Self-pay

## 2020-04-16 VITALS — BP 127/73 | HR 84 | Temp 98.1°F | Resp 16 | Wt 158.0 lb

## 2020-04-16 DIAGNOSIS — E119 Type 2 diabetes mellitus without complications: Secondary | ICD-10-CM

## 2020-04-16 DIAGNOSIS — Z789 Other specified health status: Secondary | ICD-10-CM

## 2020-04-16 LAB — GLUCOSE, POCT (MANUAL RESULT ENTRY): POC Glucose: 134 mg/dl — AB (ref 70–99)

## 2020-04-16 MED ORDER — GLIMEPIRIDE 2 MG PO TABS: 4.0000 mg | ORAL_TABLET | Freq: Every day | ORAL | 1 refills | Status: DC

## 2020-04-16 MED ORDER — TRUE METRIX BLOOD GLUCOSE TEST VI STRP: ORAL_STRIP | 12 refills | Status: DC

## 2020-04-16 MED ORDER — TRUEPLUS LANCETS 28G MISC: 3 refills | Status: DC

## 2020-04-16 MED FILL — GLIMEPIRIDE 2 MG TABS: 2 | 30 days supply | Qty: 60 | Fill #0

## 2020-04-16 NOTE — Patient Instructions (Addendum)
Contine con la metformina segn lo prescrito. Disminuya la glimepirida a 2 mg al da. Seguimiento con farmacutico clnico en 4 semanas para control de diabetes. Cumpla con las citas programadas con el proveedor de Marine scientist.  Continue Metformin as prescribed. Decrease Glimepiride to 2 mg daily. Follow-up with clinical pharmacist in 4 weeks for diabetes check-up. Keep scheduled appointments with primary provider.  Informacin bsica sobre la diabetes Diabetes Basics  La diabetes (diabetes mellitus) es una enfermedad de larga duracin (crnica). Se produce cuando el cuerpo no utiliza Artist (glucosa) que se libera de los alimentos despus de comer. La diabetes puede deberse a uno de [employer] o a ambos:  El pncreas no produce suficiente cantidad de una hormona llamada insulina.  El cuerpo no reacciona de forma normal a la insulina que produce. La insulina permite que ciertos azcares (glucosa) ingresen a las clulas del cuerpo. Esto le proporciona energa. Si tiene diabetes, los azcares no pueden ingresar a las clulas. Esto produce un aumento del nivel de Banker (hiperglucemia). Sigue estas instrucciones en tu casa: Cmo se trata la diabetes? Es posible que tenga que administrarse insulina u otros medicamentos para la diabetes todos los [place] para mantener el nivel de International aid/development worker en la sangre equilibrado. Adminstrese los medicamentos para la diabetes todos los [place] se lo haya indicado el mdico. Haga una lista de los medicamentos para la diabetes aqu: Medicamentos para la diabetes  Nombre del medicamento: ______________________________ ? Cantidad (dosis): ________________ Mammie Russian (a.m./p.m.): _______________ Cleon Dew: ___________________________________  Josephine Igo medicamento: ______________________________ ? Cantidad (dosis): ________________ Mammie Russian (a.m./p.m.): _______________ Cleon Dew: ___________________________________  Josephine Igo medicamento:  ______________________________ ? Cantidad (dosis): ________________ Mammie Russian (a.m./p.m.): _______________ Cleon Dew: ___________________________________ Si [place] insulina, aprender cmo aplicrsela con inyecciones. Es posible que deba ajustar la cantidad en funcin de los alimentos que coma. Haga una lista de los tipos de [place] aqu: Insulina  Tipo de insulina: ______________________________ ? Cantidad (dosis): ________________ Mammie Russian (a.m./p.m.): _______________ Cleon Dew: ___________________________________  Levander Campion: ______________________________ ? Cantidad (dosis): ________________ Mammie Russian (a.m./p.m.): _______________ Cleon Dew: ___________________________________  Levander Campion: ______________________________ ? Cantidad (dosis): ________________ Mammie Russian (a.m./p.m.): _______________ Cleon Dew: ___________________________________  Levander Campion: ______________________________ ? Cantidad (dosis): ________________ Mammie Russian (a.m./p.m.): _______________ Cleon Dew: ___________________________________  Levander Campion: ______________________________ ? Cantidad (dosis): ________________ Mammie Russian (a.m./p.m.): _______________ Cleon Dew: ___________________________________ Cmo me controlo el nivel de azcar en la sangre?  Controle sus niveles de azcar en la sangre con un medidor de glucemia segn las indicaciones del mdico. El mdico fijar los objetivos del tratamiento para usted. Generalmente, los resultados de los niveles de azcar en la sangre deben ser los siguientes:  Antes de las comidas (preprandial): de 80 a 130mg /dl (de 4,4 a ).  Despus de las comidas (posprandial): por debajo de 180mg /dl (9,3JQZE/S).  Nivel de A1c: menos del 7%. Anote las veces que se controlar los niveles de azcar en la sangre: Controles de azcar en la sangre  Hora: _______________ : ___________________________________  [id number]: _______________ Notas:  ___________________________________  Hora: _______________ Notas: ___________________________________  Hora: _______________ Notas: ___________________________________  Hora: _______________ Notas: ___________________________________  Hora: _______________ Notas: ___________________________________  Cleon Dew debo saber acerca del nivel bajo de azcar en la sangre? Un nivel bajo de azcar en la sangre se denomina hipoglucemia. Este cuadro ocurre cuando el nivel de azcar en la sangre es igual o menor que 70mg /dl (Mammie Russian). Entre los sntomas, se pueden incluir los siguientes:  Sentir: ? Epps. ? Preocupacin o nervios (ansiedad). ? Sudoracin y . ? Confusin. ? Mareos. ? Somnolencia. ?  Ganas de vomitar (nuseas).  Tener: ? Latidos cardacos acelerados. ? Dolor de [place]. ? Cambios en la visin. ? Hormigueo y falta de sensibilidad (entumecimiento) alrededor de la boca, los labios o la [place]. ? Movimientos espasmdicos que no puede controlar (convulsiones).  Dificultades para hacer lo siguiente: ? Moverse (coordinacin). ? Dormir. ? Desmayos. ? Molestarse con facilidad (irritabilidad). Tratamiento del nivel bajo de azcar en la sangre Para tratar un nivel bajo de azcar en la sangre, ingiera un alimento o una bebida azucarada de inmediato. Si puede pensar con claridad y tragar de manera segura, siga la regla 15/15, que consiste en lo siguiente:  Consuma 15gramos de un hidrato de carbono de accin rpida (carbohidrato). Hable con su mdico acerca de cunto debera consumir.  Algunos hidratos de carbono de accin rpida son: ? Comprimidos de azcar (pastillas de glucosa). Consuma 3o 4pastillas de glucosa. ? De 6 a 8unidades de caramelos duros. ? De 4 a 6onzas (de 120 a ) de jugo de frutas. ? De 4 a 6onzas (de 120 a ) de refresco comn (no diettico). ? 1 cucharada (67ml) de miel o azcar.  Contrlese el nivel de azcar en la sangre  despus de ingerir el hidrato de carbono.  Si el nivel de azcar en la sangre todava es igual o menor que 70mg /dl ( ), ingiera nuevamente 15gramos de un hidrato de carbono.  Si el nivel de azcar en la sangre no supera los 70mg /dl (2,5KNLZ/J) despus de 3intentos, solicite ayuda de inmediato.  Ingiera una comida o una colacin en el transcurso de 1hora despus de que el nivel de azcar en la sangre se haya normalizado. Tratamiento del nivel muy bajo de azcar en la sangre Si el nivel de azcar en la sangre es igual o menor que 54mg /dl (38mmol/l), significa que est muy bajo (hipoglucemia grave). Esto es 6,7HALP/F. No espere a ver si los sntomas desaparecen. Solicite atencin mdica de inmediato. Comunquese con el servicio de emergencias de su localidad (911 en los [place]). No conduzca por sus propios medios hospital. Preguntas para hacerle al mdico  Es necesario que me rena con 1m en el cuidado de la diabetes?  Qu equipos necesitar para cuidarme en casa?  Qu medicamentos para la diabetes necesito? Cundo debo tomarlos?  Con qu frecuencia debo controlar mi nivel de azcar en la sangre?  A qu nmero puedo llamar si tengo preguntas?  Cundo es mi prxima cita con el mdico?  Dnde puedo encontrar un grupo de apoyo para las personas con diabetes? Dnde buscar ms informacin  American Diabetes Association (Asociacin Estadounidense de la Diabetes): www.diabetes.org  American Association of Diabetes Educators (Asociacin Estadounidense de Instructores para el Cuidado de la Diabetes): www.diabeteseducator.org/patient-resources Comunquese con un mdico si:  El nivel de azcar en la sangre es igual o mayor que 240mg /dl (Radio broadcast assistant) durante 2das seguidos.  Ha estado enfermo o ha tenido fiebre durante 2das o ms y no mejora.  Tiene alguno de estos problemas durante ms de 6horas: ? No puede comer ni beber. ? Siente  malestar estomacal (nuseas). ? Vomita. ? Presenta heces lquidas (diarrea). Solicite ayuda inmediatamente si:  El nivel de azcar en la sangre est por debajo de 54mg /dl (13mmol/l).  Se siente confundida.  Tiene dificultad para hacer lo siguiente: ? Pensar con claridad. ? La respiracin. Resumen  La diabetes (diabetes mellitus) es una enfermedad de larga duracin (crnica). Se produce cuando el cuerpo no IT trainer (glucosa) que se libera de los alimentos  despus de la digestin.  Aplquese la insulina y tome los medicamentos para la diabetes como se lo hayan indicado.  Contrlese el nivel de azcar en la sangre todos los [place], con la frecuencia que le hayan indicado.  Concurra a todas las visitas de [address] se lo haya indicado el mdico. Esto es importante. Esta informacin no tiene Theme park manager el consejo del mdico. Asegrese de hacerle al mdico cualquier pregunta que tenga. Document Revised: 11/02/2018 Document Reviewed: 01/14/2018 Elsevier Patient Education  2020 [employer].

## 2020-04-16 NOTE — Progress Notes (Signed)
Patient ID: Kimberly Wilkerson, female    DOB: 1980-10-13  MRN: [id number]  CC: Diabetes Follow-Up  Subjective: Kimberly Wilkerson is a 39 y.o. female with history of diabetes, obesity, vitamin D deficiency, and mixed hyperlipidemia who presents for diabetes follow-up.  1. DIABETES TYPE 2 FOLLOW-UP: Last A1C: 8.8% on 04/01/2020 Are you fasting today: [x]  No, coffee with Splenda, egg, spinach lettuce cheese cucumbers, tortillas, oranges, water   Have you taken your anti-diabetic medications today: [x]  Yes []  No  Med Adherence:  []  Yes    [x]  No, patient states "If I don't eat then I don't take the medications" Medication side effects:  [x]  Yes, Glimepiride makes her feel tired and weak Home Monitoring?  [x]  Yes, sometimes Home glucose results range: 130-140 morning & 150-160 afternoon  Diet Adherence: [x]  Yes    []  No Exercise: []  Yes    [x]  No Hypoglycemic episodes?: []  Yes    [x]  No Numbness of the feet? []  Yes    []  No Retinopathy hx? []  Yes    []  No Last eye exam:  Comments: Last visit 07/28/2019 with nurse practitioner . During that encounter continued on Metformin 1000 mg twice daily and referred to ophthalmology. Refused Glyburide and Glimepiride at that time. On 04/02/2020 Glimepiride added to medication regimen.   Patient Active Problem List   Diagnosis Date Noted  . Vitamin D deficiency 02/23/2017  . Mixed hyperlipidemia 02/23/2017  . Diabetes (HCC) 11/15/2012  . Obesity (BMI 30-39.9) 08/15/2012     Current Outpatient Medications on File Prior to Visit  Medication Sig Dispense Refill  . atorvastatin (LIPITOR) 40 MG tablet Take 1 tablet (40 mg total) by mouth daily. 90 tablet 2  . glimepiride (AMARYL) 4 MG tablet Take 1 tablet (4 mg total) by mouth daily before breakfast. 30 tablet 3  . glucose blood (TRUE METRIX BLOOD GLUCOSE TEST) test strip Use as instructed 100 each 12  . metFORMIN (GLUCOPHAGE) 1000 MG tablet Take 1 tablet (1,000 mg total) by mouth 2  (two) times daily with a meal. 60 tablet 0  . TRUEplus Lancets 28G MISC Use as instructed 100 each 3   No current facility-administered medications on file prior to visit.    No Known Allergies  Social History   Socioeconomic History  . Marital status: Single    Spouse name: Not on file  . Number of children: Not on file  . Years of education: Not on file  . Highest education level: Not on file  Occupational History  . Not on file  Tobacco Use  . Smoking status: Never Smoker  . Smokeless tobacco: Never Used  Vaping Use  . Vaping Use: Never used  Substance and Sexual Activity  . Alcohol use: No  . Drug use: No  . Sexual activity: Yes    Partners: Male  Other Topics Concern  . Not on file  Social History Narrative   Lives with room mate, works at of Strain:   . Difficulty of Paying Living Expenses:   Food Insecurity:   . Worried About in the Last Year:   . in the Last Year:   Transportation Needs:   . (Medical):   Lack of Transportation (Non-Medical):   Physical Activity:   . Days of Exercise per Week:   . Minutes of Exercise per Session:   Stress:   . Feeling  of Stress :   Social Connections:   . Frequency of Communication with Friends and Family:   . Frequency of Social Gatherings with Friends and Family:   . Attends Religious Services:   . Active Member of Clubs or Organizations:   . Attends Banker Meetings:   Marland Kitchen Marital Status:   Intimate Partner Violence:   . Fear of Current or Ex-Partner:   . Emotionally Abused:   Marland Kitchen Physically Abused:   . Sexually Abused:     Family History  Problem Relation Age of Onset  . Diabetes Mellitus II Mother     Past Surgical History:  Procedure Laterality Date  . NO PAST SURGERIES      ROS: Review of Systems Negative except as stated above  PHYSICAL EXAM: Vitals with BMI  04/16/2020 07/28/2019 11/25/2018  Height - - 4\' 11"   Weight 158 lbs 161 lbs 6 oz 158 lbs  BMI - - 31.89  Systolic 127 112  Diastolic 73 77 75  Pulse 84 62 83  SpO2- 98%, room air  Temperature- 98.51F, oral   Wt Readings from Last 3 Encounters:  04/16/20 158 lb (71.7 kg)  07/28/19 161 lb 6.4 oz (73.2 kg)  11/25/18 158 lb (71.7 kg)    Physical Exam  General appearance - alert, well appearing, and in no distress and oriented to person, place, and time Mental status - alert, oriented to person, place, and time, normal mood, behavior, speech, dress, motor activity, and thought processes Neck - supple, no significant adenopathy Lymphatics - no palpable lymphadenopathy, no hepatosplenomegaly Chest - clear to auscultation, no wheezes, rales or rhonchi, symmetric air entry, no tachypnea, retractions or cyanosis Heart - normal rate, regular rhythm, normal S1, S2, no murmurs, rubs, clicks or gallops Neurological - alert, oriented, normal speech, no focal findings or movement disorder noted, neck supple without rigidity, cranial nerves II through XII intact, motor and sensory grossly normal bilaterally, normal muscle tone, no tremors, strength 5/5, Romberg sign negative, normal gait and station Musculoskeletal - no joint tenderness, deformity or swelling, no muscular tenderness noted, full range of motion without pain Extremities - peripheral pulses normal, no pedal edema, no clubbing or cyanosis, feet normal, good pulses, normal color, temperature and sensation  Results for orders placed or performed in visit on 04/16/20  Glucose (CBG)  Result Value Ref Range   POC Glucose 134 (A) 70 - 99 mg/dl    ASSESSMENT AND PLAN: 1. Type 2 diabetes mellitus without complication, without long-term current use of insulin (HCC): -CBG elevated during today's visit, non-fasting, and asymptomatic.  -Continue Metformin 1000 mg twice daily as prescribed. -Today reports Glimepiride 4 mg daily was restarted to  her regimen 2 weeks ago. Reports medication makes her feel tired and weak. No additional side effects. Denies episodes of hypoglycemia. -Patient has been on Glimepiride since 2014 at varying dosages. -Decrease Glimepiride from 4 mg daily to 2 mg daily to see if this improves symptoms. -Follow-up with clinical pharmacist in 4 weeks for diabetes checkup to see if symptoms have improved. Medication regimen may be adjusted if needed at that time. Write your home blood sugar results down each day and bring those results to your appointment along with your home glucose monitor. -To achieve an A1C goal of less than or equal to 7.0 percent, a fasting blood sugar of 80 to 130 mg/dL and a postprandial glucose (90 to 120 minutes after a meal) less than 180 mg/dL. In the event of sugars less  than 60 mg/dl or greater than 038 mg/dl please notify the clinic ASAP. It is recommended that you undergo annual eye exams and annual foot exams. -Discussed the importance of healthy eating habits, low-carbohydrate diet, low-sugar diet, regular aerobic exercise (at least 150 minutes a week as tolerated) and medication compliance to achieve or maintain control of diabetes. -Sending refills for glucose testing strips and lancets. -Keep scheduled appointments with primary provider.  - Glucose (CBG) - glimepiride (AMARYL) 2 MG tablet; Take 2 tablets (4 mg total) by mouth daily before breakfast.  Dispense: 30 tablet; Refill: 1 - glucose blood (TRUE METRIX BLOOD GLUCOSE TEST) test strip; Use as instructed  Dispense: 100 each; Refill: 12 - TRUEplus Lancets 28G MISC; Use as instructed  Dispense: 100 each; Refill: 3 - Amb Referral to Clinical Pharmacist  2. Language barrier: -[employer] participated during this visit. Interpreter Name: Ashby Dawes, ID#: [id number].  Patient was given the opportunity to ask questions. Patient verbalized understanding of the plan and was able to repeat key elements of the plan. Patient was given  clear instructions to go to Emergency Department or return to medical center if symptoms don't improve, worsen, or new problems develop.The patient verbalized understanding.   Rema Fendt, NP

## 2020-04-16 NOTE — Progress Notes (Signed)
Patient address concerns with glimepiride. She states that her bloods sugars have been as low as the 80's since taking this medications. It also leaves her feeling tired and weak 3-4 hours after she takes it.

## 2020-04-17 MED FILL — TRUEplus LANCETS 28G MISC: 25 days supply | Qty: 100 | Fill #0

## 2020-04-17 MED FILL — TRUE METRIX TEST STRIP: 25 days supply | Qty: 100 | Fill #0

## 2020-05-08 ENCOUNTER — Other Ambulatory Visit: Payer: Self-pay | Admitting: Family Medicine

## 2020-05-08 DIAGNOSIS — E119 Type 2 diabetes mellitus without complications: Secondary | ICD-10-CM

## 2020-05-08 MED FILL — metFORMIN HCL 1000 MG TABS: 1000 | 30 days supply | Qty: 60 | Fill #0

## 2020-05-15 ENCOUNTER — Ambulatory Visit: Payer: Self-pay | Admitting: Pharmacist

## 2020-05-15 NOTE — Progress Notes (Deleted)
   S:  PCP: Meredeth Ide  No chief complaint on file.   Patient arrives in no acute distress.  Presents for diabetes evaluation, education, and management. Patient was referred on 04/16/20 by NP Amy Zonia Kief. At that visit, glimepiride was decreased due to side effects. Patient was last seen by PCP on 07/27/20.   Pertinent PMH: none  T2DM diagnosed ***.  Family/Social History:  - FHx:  - Tobacco:  - Alcohol:    Insurance coverage/medication affordability: Self pay   Current diabetes medications:  1. Metformin 1000 mg BID 2. Glimepiride 2 mg daily before breakfast  Adverse effects:  Medication adherence: ***   Prior diabetes med trials: glyburide  Hypoglycemia: - ***Denies or endorses - Experiences at BG < *** - Treats with: ***  Lifestyle: Diet: *** Eats *** meals/day - Breakfast:*** - Lunch:*** - Dinner:*** - Snacks:*** - Drinks:***  Exercise: ***  DM Comorbidities: - Nocturia: - Neuropathy:  - last DFE - Retinopathy:  - last eye exam - Gastroparesis:   Current hypertension medications include: none Current hyperlipidemia medications include: atorvastatin 40 mg daily  O:  A1c: POCT BG today:   Lab Results  Component Value Date   HGBA1C 8.8 (H) 04/01/2020   There were no vitals filed for this visit.  Home BG Readings FBG: *** 2 hour post-meal/random: ***   Lipid Panel     Component Value Date/Time   CHOL 190 04/01/2020 1237   TRIG 129 04/01/2020 1237   HDL 60 04/01/2020 1237   CHOLHDL 3.2 04/01/2020 1237   CHOLHDL 5.0 (H) 10/21/2016 1016   VLDL 45 (H) 10/21/2016 1016   LDLCALC 107 (H) 04/01/2020 1237    Clinical Atherosclerotic Cardiovascular Disease (ASCVD): No  The 10-year ASCVD risk score Denman George DC Jr., et al., 2013) is: 0.8%   Values used to calculate the score:     Age: 39 years     Sex: Female     Is Non-Hispanic African American: No     Diabetic: Yes     Tobacco smoker: No     Systolic Blood Pressure: 127 mmHg     Is BP  treated: No     HDL Cholesterol: 60 mg/dL     Total Cholesterol: 190 mg/dL    A: 1. Diabetes - Most recent A1c *** at goal < ***%, next due *** - Clinic BG today *** at goal of *** - Home BG readings *** at goal - Hypoglycemia: *** - Adherence: *** - ***Discussed mechanism of action, adverse effects, etc - ***Assessment  - ***Lifestyle - ***Education - Future considerations: ***  2. ASCVD: primary prevention - Last LDL above goal - On high intensity statin, consider zetia - Aspirin is not indicated   P: -   - F/U Pharmacist/PCP*** Clinic Visit in ***.   Patient seen with *** Written patient instructions provided.  Total time in face to face counseling *** minutes.

## 2020-05-16 ENCOUNTER — Other Ambulatory Visit: Payer: Self-pay | Admitting: Family Medicine

## 2020-05-16 ENCOUNTER — Ambulatory Visit: Payer: Self-pay | Attending: Family Medicine | Admitting: Pharmacist

## 2020-05-16 ENCOUNTER — Other Ambulatory Visit: Payer: Self-pay

## 2020-05-16 ENCOUNTER — Encounter: Payer: Self-pay | Admitting: Pharmacist

## 2020-05-16 DIAGNOSIS — Z23 Encounter for immunization: Secondary | ICD-10-CM

## 2020-05-16 DIAGNOSIS — E119 Type 2 diabetes mellitus without complications: Secondary | ICD-10-CM

## 2020-05-16 LAB — GLUCOSE, POCT (MANUAL RESULT ENTRY): POC Glucose: 104 mg/dl — AB (ref 70–99)

## 2020-05-16 MED ORDER — GLIMEPIRIDE 2 MG PO TABS: 2.0000 mg | ORAL_TABLET | Freq: Every day | ORAL | 1 refills | Status: DC

## 2020-05-16 MED FILL — GLIMEPIRIDE 2 MG TABS: 2 | 30 days supply | Qty: 30 | Fill #0

## 2020-05-16 NOTE — Progress Notes (Signed)
° ° °  S:    PCP: Zelda  No chief complaint on file.  Patient arrives in good spirits.  Presents for diabetes evaluation, education, and management.  Patient was referred and last seen by Amy on 04/16/2020.  Family/Social History:  - FHx: DM (mother) - Tobacco: never smoker - Alcohol: denies   Insurance coverage/medication affordability: self pay   Patient reports adherence with medications.  Current diabetes medications include: metformin 1000 mg BID, glimepiride 2 mg daily  Current hypertension medications include: none  Current hyperlipidemia medications include: atorvastatin 40 mg  Patient denies hypoglycemic events.  Patient reported dietary habits:  - Pt reports cutting out bread, potatoes, and rice  Patient-reported exercise habits:  - Patient reports increasing her exercise since last PCP visit - Reports exercising daily    Patient denies nocturia (nighttime urination).  Patient denies neuropathy (nerve pain). Patient denies visual changes. Patient reports self foot exams.     O:  POCT glucose: 104  Lab Results  Component Value Date   HGBA1C 8.8 (H) 04/01/2020   There were no vitals filed for this visit.  Lipid Panel     Component Value Date/Time   CHOL 190 04/01/2020 1237   TRIG 129 04/01/2020 1237   HDL 60 04/01/2020 1237   CHOLHDL 3.2 04/01/2020 1237   CHOLHDL 5.0 (H) 10/21/2016 1016   VLDL 45 (H) 10/21/2016 1016   LDLCALC 107 (H) 04/01/2020 1237   No meter; readings are reported Home fasting blood sugars: 117 - 140  2 hour post-meal/random blood sugars: 121 - 130.  Clinical Atherosclerotic Cardiovascular Disease (ASCVD): No  The 10-year ASCVD risk score Denman George DC Jr., et al., 2013) is: 0.8%   Values used to calculate the score:     Age: 39 years     Sex: Female     Is Non-Hispanic African American: No     Diabetic: Yes     Tobacco smoker: No     Systolic Blood Pressure: 127 mmHg     Is BP treated: No     HDL Cholesterol: 60 mg/dL      Total Cholesterol: 190 mg/dL   A/P: Diabetes longstanding currently uncontrolled based on A1c but home CBGs  Reveal improvement. Patient is able to verbalize appropriate hypoglycemia management plan. Patient is adherent with medication. -Continue current regimen.  -Extensively discussed pathophysiology of diabetes, recommended lifestyle interventions, dietary effects on blood sugar control -Counseled on s/sx of and management of hypoglycemia -Next A1C anticipated 06/2020.   ASCVD risk - primary prevention in patient with diabetes. Last LDL is not controlled. ASCVD risk score is not >20%  - continue current statin medication. -Continued atorvastatin 40 mg.   HM: Fluarix given. She reports she has taken her Covid shot. Will bring card to follow-up appt.   Written patient instructions provided.  Total time in face to face counseling 15 minutes.   Follow up Pharmacist Clinic Visit in 1 month to reassess.  Butch Penny, PharmD, CPP Clinical Pharmacist [facility] [phone]

## 2020-05-21 ENCOUNTER — Other Ambulatory Visit: Payer: Self-pay | Admitting: Family Medicine

## 2020-05-21 DIAGNOSIS — E119 Type 2 diabetes mellitus without complications: Secondary | ICD-10-CM

## 2020-06-16 NOTE — Progress Notes (Signed)
   S:    PCP: Zelda  Patient arrives in good spirits. Presents for diabetes evaluation, education, and management.  Patient was referred and last seen by Amy on 04/16/2020. Last seen pharmacy clinic 05/16/20 at which time current diabetes medications were continued due to improved CBG control.   Today, patient reports no complaints with current diabetes regimen except she needs refills for metformin.   Family/Social History:  - FHx: DM (mother) - Tobacco: never smoker - Alcohol: denies   Insurance coverage/medication affordability: self pay   Patient reports adherence with medications.  Current diabetes medications include: metformin 1000 mg BID, glimepiride 2 mg daily  Current hypertension medications include: none  Current hyperlipidemia medications include: atorvastatin 40 mg  Patient denies hypoglycemic events.  Patient reported dietary habits:  - Pt reports continuing to cut out bread, potatoes, and rice  Patient-reported exercise habits:  - Patient reports increasing her exercise since last PCP visit - Reports exercising 2-3 times per week (walking)   Patient denies nocturia (nighttime urination).  Patient denies neuropathy (nerve pain). Patient denies visual changes. Patient reports self foot exams.    O:  POCT glucose: 172 (1.5h post-prandial, ate eggs and spinach for lunch)  Lab Results  Component Value Date   HGBA1C 8.8 (H) 04/01/2020   There were no vitals filed for this visit.  Lipid Panel     Component Value Date/Time   CHOL 190 04/01/2020 1237   TRIG 129 04/01/2020 1237   HDL 60 04/01/2020 1237   CHOLHDL 3.2 04/01/2020 1237   CHOLHDL 5.0 (H) 10/21/2016 1016   VLDL 45 (H) 10/21/2016 1016   LDLCALC 107 (H) 04/01/2020 1237   No meter; readings are reported Home fasting blood sugars: 130-135 2 hour post-meal/random blood sugars: 140  Clinical Atherosclerotic Cardiovascular Disease (ASCVD): No  The 10-year ASCVD risk score Denman George DC Jr., et al., 2013)  is: 0.8%   Values used to calculate the score:     Age: 39 years     Sex: Female     Is Non-Hispanic African American: No     Diabetic: Yes     Tobacco smoker: No     Systolic Blood Pressure: 127 mmHg     Is BP treated: No     HDL Cholesterol: 60 mg/dL     Total Cholesterol: 190 mg/dL   A/P: Diabetes longstanding currently uncontrolled based on most recent A1c but home CBGs reveal continued improvement. Patient is able to verbalize appropriate hypoglycemia management plan. Patient is adherent with medication. -Continue current regimen -Extensively discussed pathophysiology of diabetes, recommended lifestyle interventions, dietary effects on blood sugar control -Counseled on s/sx of and management of hypoglycemia -Next A1C anticipated 06/2020.   ASCVD risk - primary prevention in patient with diabetes. Last LDL is not controlled. ASCVD risk score is not >20%  - continue current statin medication. -Continued atorvastatin 40 mg.   Written patient instructions provided.  Total time in face to face counseling 15 minutes.   Follow up with PCP in 1 month - due for A1c at that time.   Pervis Hocking, PharmD PGY1 Pharmacy Resident 06/17/2020 3:01 PM

## 2020-06-17 ENCOUNTER — Ambulatory Visit: Payer: Self-pay | Attending: Nurse Practitioner | Admitting: Pharmacist

## 2020-06-17 ENCOUNTER — Other Ambulatory Visit: Payer: Self-pay | Admitting: Family Medicine

## 2020-06-17 ENCOUNTER — Encounter: Payer: Self-pay | Admitting: Pharmacist

## 2020-06-17 ENCOUNTER — Other Ambulatory Visit: Payer: Self-pay

## 2020-06-17 DIAGNOSIS — E119 Type 2 diabetes mellitus without complications: Secondary | ICD-10-CM

## 2020-06-17 LAB — GLUCOSE, POCT (MANUAL RESULT ENTRY): POC Glucose: 172 mg/dl — AB (ref 70–99)

## 2020-06-17 MED ORDER — METFORMIN HCL 1000 MG PO TABS
1000.0000 mg | ORAL_TABLET | Freq: Two times a day (BID) | ORAL | 2 refills | Status: DC
Start: 2020-06-17 — End: 2020-07-24

## 2020-06-17 MED FILL — METFORMIN HCL 1000 MG TABS: 1000 | 30 days supply | Qty: 60 | Fill #0

## 2020-07-24 ENCOUNTER — Other Ambulatory Visit: Payer: Self-pay | Admitting: Nurse Practitioner

## 2020-07-24 ENCOUNTER — Ambulatory Visit: Payer: Self-pay | Attending: Nurse Practitioner | Admitting: Nurse Practitioner

## 2020-07-24 ENCOUNTER — Other Ambulatory Visit: Payer: Self-pay

## 2020-07-24 ENCOUNTER — Encounter: Payer: Self-pay | Admitting: Nurse Practitioner

## 2020-07-24 VITALS — BP 98/66 | HR 84 | Temp 97.7°F | Ht 59.0 in | Wt 161.0 lb

## 2020-07-24 DIAGNOSIS — E785 Hyperlipidemia, unspecified: Secondary | ICD-10-CM

## 2020-07-24 DIAGNOSIS — Z9109 Other allergy status, other than to drugs and biological substances: Secondary | ICD-10-CM

## 2020-07-24 DIAGNOSIS — E119 Type 2 diabetes mellitus without complications: Secondary | ICD-10-CM

## 2020-07-24 LAB — POCT GLYCOSYLATED HEMOGLOBIN (HGB A1C): Hemoglobin A1C: 7.9 % — AB (ref 4.0–5.6)

## 2020-07-24 LAB — GLUCOSE, POCT (MANUAL RESULT ENTRY): POC Glucose: 216 mg/dl — AB (ref 70–99)

## 2020-07-24 MED ORDER — GLIMEPIRIDE 2 MG PO TABS: 2.0000 mg | ORAL_TABLET | Freq: Every day | ORAL | 1 refills | Status: DC

## 2020-07-24 MED ORDER — METFORMIN HCL 1000 MG PO TABS: 1000.0000 mg | ORAL_TABLET | Freq: Two times a day (BID) | ORAL | 2 refills | Status: DC

## 2020-07-24 MED ORDER — ALBUTEROL SULFATE HFA 108 (90 BASE) MCG/ACT IN AERS: 2.0000 | INHALATION_SPRAY | Freq: Four times a day (QID) | RESPIRATORY_TRACT | 1 refills | Status: DC | PRN

## 2020-07-24 MED ORDER — TRUEPLUS LANCETS 28G MISC: 3 refills | Status: DC

## 2020-07-24 MED ORDER — ATORVASTATIN CALCIUM 40 MG PO TABS: 40.0000 mg | ORAL_TABLET | Freq: Every day | ORAL | 2 refills | Status: DC

## 2020-07-24 MED ORDER — TRUE METRIX BLOOD GLUCOSE TEST VI STRP: ORAL_STRIP | 12 refills | Status: DC

## 2020-07-24 MED ORDER — CETIRIZINE HCL 10 MG PO TABS: 10.0000 mg | ORAL_TABLET | Freq: Every day | ORAL | 11 refills | Status: DC

## 2020-07-24 MED FILL — GLIMEPIRIDE 2 MG TABS: 2 | 30 days supply | Qty: 30 | Fill #0

## 2020-07-24 MED FILL — ALBUTEROL SULFATE HFA 108 (: 108 (90 BAS | 25 days supply | Qty: 18 | Fill #0

## 2020-07-24 MED FILL — TRUE METRIX TEST STRIP: 25 days supply | Qty: 100 | Fill #0

## 2020-07-24 MED FILL — ?ATORVASTATIN 40MG TABLET: 40 | 30 days supply | Qty: 30 | Fill #0

## 2020-07-24 MED FILL — TRUEplus LANCETS 28G MISC: 25 days supply | Qty: 100 | Fill #0

## 2020-07-24 MED FILL — METFORMIN HCL 1000 MG TABS: 1000 | 30 days supply | Qty: 60 | Fill #0

## 2020-07-24 MED FILL — ?CETIRIZINE HCL 10 MG TABLE: 10 | 30 days supply | Qty: 30 | Fill #0

## 2020-07-24 NOTE — Progress Notes (Signed)
Assessment & Plan:  Kimberly Wilkerson was seen today for diabetes.  Diagnoses and all orders for this visit:  Type 2 diabetes mellitus without complication, without long-term current use of insulin (HCC) -     Glucose (CBG) -     HgB A1c -     Microalbumin/Creatinine Ratio, Urine -     glimepiride (AMARYL) 2 MG tablet; Take 1 tablet (2 mg total) by mouth daily before breakfast. -     glucose blood (TRUE METRIX BLOOD GLUCOSE TEST) test strip; Use as instructed -     metFORMIN (GLUCOPHAGE) 1000 MG tablet; Take 1 tablet (1,000 mg total) by mouth 2 (two) times daily with a meal. -     TRUEplus Lancets 28G MISC; Use as instructed Continue blood sugar control as discussed in office today, low carbohydrate diet, and regular physical exercise as tolerated, 150 minutes per week (30 min each day, 5 days per week, or 50 min 3 days per week). Keep blood sugar logs with fasting goal of 90-130 mg/dl, post prandial (after you eat) less than 180.  For Hypoglycemia: BS <60 and Hyperglycemia BS >400; contact the clinic ASAP. Annual eye exams and foot exams are recommended.   Hyperlipidemia LDL goal <70 -     atorvastatin (LIPITOR) 40 MG tablet; Take 1 tablet (40 mg total) by mouth daily. INSTRUCTIONS: Work on a low fat, heart healthy diet and participate in regular aerobic exercise program by working out at least 150 minutes per week; 5 days a week-30 minutes per day. Avoid red meat/beef/steak,  fried foods. junk foods, sodas, sugary drinks, unhealthy snacking, alcohol and smoking.  Drink at least 80 oz of water per day and monitor your carbohydrate intake daily.    Environmental allergies -     cetirizine (ZYRTEC) 10 MG tablet; Take 1 tablet (10 mg total) by mouth daily. -     albuterol (VENTOLIN HFA) 108 (90 Base) MCG/ACT inhaler; Inhale 2 puffs into the lungs every 6 (six) hours as needed for wheezing or shortness of breath.    Patient has been counseled on age-appropriate routine health concerns for screening  and prevention. These are reviewed and up-to-date. Referrals have been placed accordingly. Immunizations are up-to-date or declined.    Subjective:   Chief Complaint  Patient presents with   Diabetes    Pt. is here for diabetes follow up.    HPI Kimberly Wilkerson 39 y.o. female presents to office today for follow up. Patient has been counseled on age-appropriate routine health concerns for screening and prevention. These are reviewed and up-to-date. Referrals have been placed accordingly. Immunizations are up-to-date or declined.    MAMMOGRAM: Overdue  VRI was used to communicate directly with patient for the entire encounter including providing detailed patient instructions.    DM TYPE 2 Not well controlled or at goal. Weakness and hypoglycemia with taking glimepiride 4mg . Can only take 2mg  daily.  She is not taking metformin every day as prescribed but when she takes it she does take 1000 mg BID.   She is not diet adherent. Declines me adding any medications today. Wants to work on her diet and does not wish to start any medications. She denies any symptoms of hypo or hyperglycemia at this time. Lab Results  Component Value Date   HGBA1C 7.9 (A) 07/24/2020   Lab Results  Component Value Date   HGBA1C 8.8 (H) 04/01/2020     Review of Systems  Constitutional: Positive for malaise/fatigue. Negative for fever and weight  loss.  HENT: Negative.  Negative for nosebleeds.   Eyes: Negative.  Negative for blurred vision, double vision and photophobia.  Respiratory: Positive for shortness of breath (with cleaning her house). Negative for cough.   Cardiovascular: Negative.  Negative for chest pain, palpitations and leg swelling.  Gastrointestinal: Negative.  Negative for heartburn, nausea and vomiting.  Musculoskeletal: Negative.  Negative for myalgias.  Neurological: Negative.  Negative for dizziness, focal weakness, seizures and headaches.  Endo/Heme/Allergies: Positive for  environmental allergies.       Allergic to dust and cleaning supplies  Psychiatric/Behavioral: Negative.  Negative for suicidal ideas.    Past Medical History:  Diagnosis Date   Fetal demise 09/11/2012   Gestational diabetes    Gonorrhea 2005   Type 2 diabetes mellitus complicating pregnancy, antepartum 06/06/2012    Past Surgical History:  Procedure Laterality Date   NO PAST SURGERIES      Family History  Problem Relation Age of Onset   Diabetes Mellitus II Mother     Social History Reviewed with no changes to be made today.   Outpatient Medications Prior to Visit  Medication Sig Dispense Refill   atorvastatin (LIPITOR) 40 MG tablet Take 1 tablet (40 mg total) by mouth daily. 90 tablet 2   glimepiride (AMARYL) 2 MG tablet Take 1 tablet (2 mg total) by mouth daily before breakfast. 30 tablet 1   glucose blood (TRUE METRIX BLOOD GLUCOSE TEST) test strip Use as instructed 100 each 12   metFORMIN (GLUCOPHAGE) 1000 MG tablet Take 1 tablet (1,000 mg total) by mouth 2 (two) times daily with a meal. 60 tablet 2   TRUEplus Lancets 28G MISC Use as instructed 100 each 3   No facility-administered medications prior to visit.    No Known Allergies     Objective:    BP 98/66 (BP Location: Left Arm, Patient Position: Sitting, Cuff Size: Normal)    Pulse 84    Temp 97.7 F (36.5 C) (Temporal)    Ht 4\' 11"  (1.499 m)    Wt 161 lb (73 kg)    SpO2 96%    BMI 32.52 kg/m  Wt Readings from Last 3 Encounters:  07/24/20 161 lb (73 kg)  04/16/20 158 lb (71.7 kg)  07/28/19 161 lb 6.4 oz (73.2 kg)    Physical Exam Vitals and nursing note reviewed.  Constitutional:      Appearance: She is well-developed.  HENT:     Head: Normocephalic and atraumatic.  Cardiovascular:     Rate and Rhythm: Normal rate and regular rhythm.     Heart sounds: Normal heart sounds. No murmur heard.  No friction rub. No gallop.   Pulmonary:     Effort: Pulmonary effort is normal. No tachypnea or  respiratory distress.     Breath sounds: Examination of the right-lower field reveals wheezing. Examination of the left-lower field reveals wheezing. Wheezing present. No decreased breath sounds, rhonchi or rales.  Chest:     Chest wall: No tenderness.  Abdominal:     General: Bowel sounds are normal.     Palpations: Abdomen is soft.  Musculoskeletal:        General: Normal range of motion.     Cervical back: Normal range of motion.  Skin:    General: Skin is warm and dry.  Neurological:     Mental Status: She is alert and oriented to person, place, and time.     Coordination: Coordination normal.  Psychiatric:  Behavior: Behavior normal. Behavior is cooperative.        Thought Content: Thought content normal.        Judgment: Judgment normal.          Patient has been counseled extensively about nutrition and exercise as well as the importance of adherence with medications and regular follow-up. The patient was given clear instructions to go to ER or return to medical center if symptoms don't improve, worsen or new problems develop. The patient verbalized understanding.   Follow-up: No follow-ups on file.   Claiborne Rigg, FNP-BC [facility] [place], [place] [phone]   07/24/2020, 2:56 PM

## 2020-07-25 LAB — BASIC METABOLIC PANEL
BUN/Creatinine Ratio: 18 (ref 9–23)
BUN: 8 mg/dL (ref 6–24)
CO2: 23 mmol/L (ref 20–29)
Calcium: 10.1 mg/dL (ref 8.7–10.2)
Chloride: 100 mmol/L (ref 96–106)
Creatinine, Ser: 0.45 mg/dL — ABNORMAL LOW (ref 0.57–1.00)
GFR calc Af Amer: 144 mL/min/{1.73_m2} (ref >59)
GFR calc non Af Amer: 125 mL/min/{1.73_m2} (ref >59)
Glucose: 171 mg/dL — ABNORMAL HIGH (ref 65–99)
Potassium: 3.9 mmol/L (ref 3.5–5.2)
Sodium: 137 mmol/L (ref 134–144)

## 2020-07-25 LAB — MICROALBUMIN / CREATININE URINE RATIO
Creatinine, Urine: 23.6 mg/dL
Microalb/Creat Ratio: <13 mg/g creat (ref 0–29)
Microalbumin, Urine: <3 ug/mL

## 2020-07-31 ENCOUNTER — Other Ambulatory Visit: Payer: Self-pay | Admitting: Obstetrics and Gynecology

## 2020-07-31 DIAGNOSIS — Z1231 Encounter for screening mammogram for malignant neoplasm of breast: Secondary | ICD-10-CM

## 2020-09-10 MED FILL — METFORMIN HCL 1000 MG TABS: 1000 | 30 days supply | Qty: 60 | Fill #1

## 2020-09-10 MED FILL — ?ATORVASTATIN 40MG TABLET: 40 | 30 days supply | Qty: 30 | Fill #1

## 2020-09-10 MED FILL — GLIMEPIRIDE 2 MG TABS: 2 | 30 days supply | Qty: 30 | Fill #1

## 2020-09-10 MED FILL — ?CETIRIZINE HCL 10 MG TABLE: 10 | 30 days supply | Qty: 30 | Fill #1

## 2020-10-03 ENCOUNTER — Ambulatory Visit: Payer: Self-pay

## 2020-10-10 MED FILL — GLIMEPIRIDE 2 MG TABS: 2 | 30 days supply | Qty: 30 | Fill #1

## 2020-10-10 MED FILL — ALBUTEROL SULFATE HFA 108 (: 108 (90 BAS | 25 days supply | Qty: 18 | Fill #1

## 2020-10-10 MED FILL — METFORMIN HCL 1000 MG TABS: 1000 | 30 days supply | Qty: 60 | Fill #1

## 2020-10-10 MED FILL — ?ATORVASTATIN 40MG TABLET: 40 | 30 days supply | Qty: 30 | Fill #2

## 2020-10-24 ENCOUNTER — Other Ambulatory Visit: Payer: Self-pay

## 2020-10-24 ENCOUNTER — Ambulatory Visit
Admission: RE | Admit: 2020-10-24 | Discharge: 2020-10-24 | Disposition: A | Discharge status: Home / Self Care | Payer: No Typology Code available for payment source | Source: Ambulatory Visit | Attending: Obstetrics and Gynecology | Admitting: Obstetrics and Gynecology

## 2020-10-24 ENCOUNTER — Ambulatory Visit: Payer: Self-pay | Admitting: *Deleted

## 2020-10-24 ENCOUNTER — Encounter (INDEPENDENT_AMBULATORY_CARE_PROVIDER_SITE_OTHER): Payer: Self-pay

## 2020-10-24 VITALS — BP 120/80 | Wt 156.6 lb

## 2020-10-24 DIAGNOSIS — Z1239 Encounter for other screening for malignant neoplasm of breast: Secondary | ICD-10-CM

## 2020-10-24 DIAGNOSIS — Z1231 Encounter for screening mammogram for malignant neoplasm of breast: Secondary | ICD-10-CM

## 2020-10-24 NOTE — Patient Instructions (Signed)
Explained breast self awareness with Kimberly Wilkerson. Patient did not need a Pap smear today due to last Pap smear and HPV typing was 09/26/2018. Let her know B[facility]CP will cover Pap smears and HPV typing every 5 years unless has a history of abnormal Pap smears. Referred patient to the [facility] for a screening mammogram on the mobile unit. Appointment scheduled Thursday, October 24, 2020 at 1430. Patient escorted to the mobile unit following BCCCP appointment for her screening mammogram. Let patient know the Breast Center will follow up with her within the next couple weeks with results of her mammogram by letter or phone. [name] verbalized understanding.  Chera Slivka, Kathaleen Maser, RN 2:22 PM

## 2020-10-24 NOTE — Progress Notes (Signed)
[name] is a 40 y.o. female who presents to [facility] clinic today with complaint of right breast pain at biopsy site since biopsy was completed 06/16/2017. Patient states the pain comes and goes. Patient rates the pain at a 5 out of 10.    Pap Smear: Pap smear not completed today. Last Pap smear was 09/26/2018 at [facility] and was normal with negative HPV. Per patient has no history of an abnormal Pap smear. Last Pap smear result is available in Epic.   Physical exam: Breasts Right breast slightly larger than left breast. No skin abnormalities left breast. Observed a scar where biopsy was completed 06/16/2017 at 3 o'clock 3 cm from the nipple.  No nipple retraction bilateral breasts. No nipple discharge bilateral breasts. No lymphadenopathy. No lumps palpated bilateral breasts. No complaints of pain or tenderness on exam.       Pelvic/Bimanual Pap is not indicated today per BCCCP guidelines.    Smoking History: Patient has never smoked.   Patient Navigation: Patient education provided. Access to services provided for patient through [facility] program. Spanish interpreter Natale Lay from [facility] provided.    Breast and Cervical Cancer Risk Assessment: Patient has family history of a second paternal cousin having breast cancer. Patient has no known genetic mutations or history of radiation treatment to the chest before age 64. Patient does not have history of cervical dysplasia, immunocompromised, or DES exposure in-utero.  Risk Assessment    Risk Scores      10/24/2020   Last edited by: Meryl Dare, CMA   5-year risk: 0.7 %   Lifetime risk: 9.5 %          A: BCCCP exam without pap smear Complaint of pain at right breast site that was completed 06/16/2017.  P: Referred patient to the [facility] for a screening mammogram on the mobile unit. Appointment scheduled Thursday, October 24, 2020 at 1430.  Priscille Heidelberg, RN 10/24/2020 2:22 PM

## 2020-10-25 ENCOUNTER — Other Ambulatory Visit: Payer: Self-pay | Admitting: Nurse Practitioner

## 2020-10-25 ENCOUNTER — Encounter: Payer: Self-pay | Admitting: Nurse Practitioner

## 2020-10-25 ENCOUNTER — Ambulatory Visit: Payer: Self-pay | Attending: Nurse Practitioner | Admitting: Nurse Practitioner

## 2020-10-25 DIAGNOSIS — E119 Type 2 diabetes mellitus without complications: Secondary | ICD-10-CM

## 2020-10-25 DIAGNOSIS — E785 Hyperlipidemia, unspecified: Secondary | ICD-10-CM

## 2020-10-25 MED ORDER — GLIMEPIRIDE 2 MG PO TABS: 2.0000 mg | ORAL_TABLET | Freq: Every day | ORAL | 0 refills | Status: DC

## 2020-10-25 MED ORDER — METFORMIN HCL 1000 MG PO TABS: 1000.0000 mg | ORAL_TABLET | Freq: Two times a day (BID) | ORAL | 0 refills | Status: DC

## 2020-10-25 MED ORDER — ATORVASTATIN CALCIUM 40 MG PO TABS: 40.0000 mg | ORAL_TABLET | Freq: Every day | ORAL | 2 refills | Status: DC

## 2020-10-25 NOTE — Progress Notes (Signed)
Virtual Visit via Telephone Note Due to national recommendations of social distancing due to COVID 19, telehealth visit is felt to be most appropriate for this patient at this time.  I discussed the limitations, risks, security and privacy concerns of performing an evaluation and management service by telephone and the availability of in person appointments. I also discussed with the patient that there may be a patient responsible charge related to this service. The patient expressed understanding and agreed to proceed.    I connected with Kimberly Wilkerson on 10/25/20  at   3:10 PM EST  EDT by telephone and verified that I am speaking with the correct person using two identifiers.   Consent I discussed the limitations, risks, security and privacy concerns of performing an evaluation and management service by telephone and the availability of in person appointments. I also discussed with the patient that there may be a patient responsible charge related to this service. The patient expressed understanding and agreed to proceed.   Location of Patient: Private Residence   Location of Provider: [facility]    Persons participating in Telemedicine visit: Bertram Denver FNP-BC YY [place] CMA Neka Wilkerson  Spanish Interpreter ID# [phone]   History of Present Illness: Telemedicine visit for: Follow up She has a past medical history of Fetal demise (09/11/2012), Gestational diabetes, DM2, HPL  DM 2 She is monitoring her blood glucose levels 1-2 times per day.  Postprandial today:145. She denies any symptoms of hypo or hyperglycemia. Taking glimepiride 2 mg daily and metformin 1000 mg BID. LDL not at goal with taking atorvastatin 40 mg daily.  Lab Results  Component Value Date   HGBA1C 7.9 (A) 07/24/2020   Lab Results  Component Value Date   LDLCALC 107 (H) 04/01/2020     Past Medical History:  Diagnosis Date  . Fetal demise 09/11/2012  .  Gestational diabetes   . Gonorrhea 2005  . Type 2 diabetes mellitus complicating pregnancy, antepartum 06/06/2012    Past Surgical History:  Procedure Laterality Date  . NO PAST SURGERIES      Family History  Problem Relation Age of Onset  . Diabetes Mellitus II Mother   . Breast cancer Cousin     Social History   Socioeconomic History  . Marital status: Single    Spouse name: Not on file  . Number of children: Not on file  . Years of education: Not on file  . Highest education level: Not on file  Occupational History  . Not on file  Tobacco Use  . Smoking status: Never Smoker  . Smokeless tobacco: Never Used  Vaping Use  . Vaping Use: Never used  Substance and Sexual Activity  . Alcohol use: No  . Drug use: No  . Sexual activity: Yes    Partners: Male  Other Topics Concern  . Not on file  Social History Narrative   Lives with room mate, works at [employer]   Financial Resource Strain: Not on file  Food Insecurity: Not on file  Transportation Needs: No Transportation Needs  . Lack of Transportation (Medical): No  . Lack of Transportation (Non-Medical): No  Physical Activity: Not on file  Stress: Not on file  Social Connections: Not on file     Observations/Objective: Awake, alert and oriented x 3   Review of Systems  Constitutional: Negative for fever, malaise/fatigue and weight loss.  HENT: Negative.  Negative for nosebleeds.   Eyes: Negative.  Negative for blurred vision, double vision and photophobia.  Respiratory: Negative.  Negative for cough and shortness of breath.   Cardiovascular: Negative.  Negative for chest pain, palpitations and leg swelling.  Gastrointestinal: Negative.  Negative for heartburn, nausea and vomiting.  Musculoskeletal: Negative.  Negative for myalgias.  Neurological: Negative.  Negative for dizziness, focal weakness, seizures and headaches.  Psychiatric/Behavioral: Negative.  Negative for suicidal  ideas.    Assessment and Plan: Gladyce was seen today for follow-up.  Diagnoses and all orders for this visit:  Type 2 diabetes mellitus without complication, without long-term current use of insulin (HCC) -     metFORMIN (GLUCOPHAGE) 1000 MG tablet; Take 1 tablet (1,000 mg total) by mouth 2 (two) times daily with a meal. -     glimepiride (AMARYL) 2 MG tablet; Take 1 tablet (2 mg total) by mouth daily before breakfast. Continue blood sugar control as discussed in office today, low carbohydrate diet, and regular physical exercise as tolerated, 150 minutes per week (30 min each day, 5 days per week, or 50 min 3 days per week). Keep blood sugar logs with fasting goal of 90-130 mg/dl, post prandial (after you eat) less than 180.  For Hypoglycemia: BS <60 and Hyperglycemia BS >400; contact the clinic ASAP. Annual eye exams and foot exams are recommended.   Hyperlipidemia LDL goal <70 -     atorvastatin (LIPITOR) 40 MG tablet; Take 1 tablet (40 mg total) by mouth daily. INSTRUCTIONS: Work on a low fat, heart healthy diet and participate in regular aerobic exercise program by working out at least 150 minutes per week; 5 days a week-30 minutes per day. Avoid red meat/beef/steak,  fried foods. junk foods, sodas, sugary drinks, unhealthy snacking, alcohol and smoking.  Drink at least 80 oz of water per day and monitor your carbohydrate intake daily.       Follow Up Instructions Return in about 3 months (around 01/22/2021).     I discussed the assessment and treatment plan with the patient. The patient was provided an opportunity to ask questions and all were answered. The patient agreed with the plan and demonstrated an understanding of the instructions.   The patient was advised to call back or seek an in-person evaluation if the symptoms worsen or if the condition fails to improve as anticipated.  I provided 10 minutes of non-face-to-face time during this encounter including median intraservice  time, reviewing previous notes, labs, imaging, medications and explaining diagnosis and management.  Claiborne Rigg, FNP-BC

## 2020-11-01 ENCOUNTER — Other Ambulatory Visit: Payer: Self-pay | Admitting: Nurse Practitioner

## 2020-11-01 ENCOUNTER — Ambulatory Visit: Payer: Self-pay | Attending: Nurse Practitioner

## 2020-11-01 ENCOUNTER — Other Ambulatory Visit: Payer: Self-pay

## 2020-11-01 DIAGNOSIS — E1169 Type 2 diabetes mellitus with other specified complication: Secondary | ICD-10-CM

## 2020-11-01 DIAGNOSIS — E0800 Diabetes mellitus due to underlying condition with hyperosmolarity without nonketotic hyperglycemic-hyperosmolar coma (NKHHC): Secondary | ICD-10-CM

## 2020-11-01 DIAGNOSIS — E785 Hyperlipidemia, unspecified: Secondary | ICD-10-CM

## 2020-11-01 DIAGNOSIS — E119 Type 2 diabetes mellitus without complications: Secondary | ICD-10-CM

## 2020-11-01 DIAGNOSIS — E669 Obesity, unspecified: Secondary | ICD-10-CM

## 2020-11-02 ENCOUNTER — Other Ambulatory Visit: Payer: Self-pay | Admitting: Nurse Practitioner

## 2020-11-02 DIAGNOSIS — E119 Type 2 diabetes mellitus without complications: Secondary | ICD-10-CM

## 2020-11-02 LAB — HEMOGLOBIN A1C
Est. average glucose Bld gHb Est-mCnc: 214 mg/dL
Hgb A1c MFr Bld: 9.1 % — ABNORMAL HIGH (ref 4.8–5.6)

## 2020-11-02 LAB — LIPID PANEL
Chol/HDL Ratio: 2.6 ratio (ref 0.0–4.4)
Cholesterol, Total: 142 mg/dL (ref 100–199)
HDL: 54 mg/dL (ref >39)
LDL Chol Calc (NIH): 57 mg/dL (ref 0–99)
Triglycerides: 187 mg/dL — ABNORMAL HIGH (ref 0–149)
VLDL Cholesterol Cal: 31 mg/dL (ref 5–40)

## 2020-11-02 LAB — CMP14+EGFR
ALT: 29 IU/L (ref 0–32)
AST: 25 IU/L (ref 0–40)
Albumin/Globulin Ratio: 1.6 (ref 1.2–2.2)
Albumin: 4.6 g/dL (ref 3.8–4.8)
Alkaline Phosphatase: 78 IU/L (ref 44–121)
BUN/Creatinine Ratio: 19 (ref 9–23)
BUN: 11 mg/dL (ref 6–24)
Bilirubin Total: 0.9 mg/dL (ref 0.0–1.2)
CO2: 21 mmol/L (ref 20–29)
Calcium: 10 mg/dL (ref 8.7–10.2)
Chloride: 103 mmol/L (ref 96–106)
Creatinine, Ser: 0.58 mg/dL (ref 0.57–1.00)
GFR calc Af Amer: 132 mL/min/{1.73_m2} (ref >59)
GFR calc non Af Amer: 115 mL/min/{1.73_m2} (ref >59)
Globulin, Total: 2.8 g/dL (ref 1.5–4.5)
Glucose: 162 mg/dL — ABNORMAL HIGH (ref 65–99)
Potassium: 3.7 mmol/L (ref 3.5–5.2)
Sodium: 141 mmol/L (ref 134–144)
Total Protein: 7.4 g/dL (ref 6.0–8.5)

## 2020-11-02 MED ORDER — GLIMEPIRIDE 4 MG PO TABS: 4.0000 mg | ORAL_TABLET | Freq: Every day | ORAL | 0 refills | Status: DC

## 2020-11-04 MED FILL — GLIMEPIRIDE 4 MG TABS: 4 | 30 days supply | Qty: 30 | Fill #0

## 2020-11-05 MED FILL — METFORMIN HCL 1000 MG TABS: 1000 | 30 days supply | Qty: 60 | Fill #2

## 2020-12-21 ENCOUNTER — Other Ambulatory Visit: Payer: Self-pay

## 2021-01-02 ENCOUNTER — Other Ambulatory Visit: Payer: Self-pay

## 2021-01-02 MED FILL — Glimepiride Tab 4 MG: ORAL | 30 days supply | Qty: 30 | Fill #0 | Status: AC

## 2021-01-02 MED FILL — Atorvastatin Calcium Tab 40 MG (Base Equivalent): ORAL | 30 days supply | Qty: 30 | Fill #0 | Status: AC

## 2021-01-02 MED FILL — Metformin HCl Tab 1000 MG: ORAL | 30 days supply | Qty: 60 | Fill #0 | Status: AC

## 2021-01-27 ENCOUNTER — Ambulatory Visit: Payer: No Typology Code available for payment source | Admitting: Nurse Practitioner

## 2021-03-03 ENCOUNTER — Ambulatory Visit: Payer: Self-pay | Attending: Nurse Practitioner

## 2021-03-03 ENCOUNTER — Other Ambulatory Visit: Payer: Self-pay

## 2021-03-26 ENCOUNTER — Encounter: Payer: Self-pay | Admitting: Nurse Practitioner

## 2021-03-26 ENCOUNTER — Ambulatory Visit: Payer: Self-pay | Attending: Nurse Practitioner | Admitting: Nurse Practitioner

## 2021-03-26 ENCOUNTER — Other Ambulatory Visit: Payer: Self-pay

## 2021-03-26 ENCOUNTER — Encounter (INDEPENDENT_AMBULATORY_CARE_PROVIDER_SITE_OTHER): Payer: Self-pay

## 2021-03-26 VITALS — BP 119/84 | HR 80 | Resp 16 | Wt 162.0 lb

## 2021-03-26 DIAGNOSIS — D649 Anemia, unspecified: Secondary | ICD-10-CM

## 2021-03-26 DIAGNOSIS — E0800 Diabetes mellitus due to underlying condition with hyperosmolarity without nonketotic hyperglycemic-hyperosmolar coma (NKHHC): Secondary | ICD-10-CM

## 2021-03-26 DIAGNOSIS — E785 Hyperlipidemia, unspecified: Secondary | ICD-10-CM

## 2021-03-26 DIAGNOSIS — Z9109 Other allergy status, other than to drugs and biological substances: Secondary | ICD-10-CM

## 2021-03-26 DIAGNOSIS — E1165 Type 2 diabetes mellitus with hyperglycemia: Secondary | ICD-10-CM

## 2021-03-26 LAB — GLUCOSE, POCT (MANUAL RESULT ENTRY): POC Glucose: 198 mg/dl — AB (ref 70–99)

## 2021-03-26 LAB — POCT GLYCOSYLATED HEMOGLOBIN (HGB A1C): HbA1c, POC (controlled diabetic range): 8.5 % — AB (ref 0.0–7.0)

## 2021-03-26 MED ORDER — TRUE METRIX METER W/DEVICE KIT
PACK | 0 refills | Status: DC
  Filled 2021-03-26: qty 1, 365d supply, fill #0

## 2021-03-26 MED ORDER — TRUE METRIX BLOOD GLUCOSE TEST VI STRP
ORAL_STRIP | 12 refills | Status: DC
  Filled 2021-03-26: qty 100, 25d supply, fill #0
  Filled 2021-07-29: qty 100, 25d supply, fill #1

## 2021-03-26 MED ORDER — CETIRIZINE HCL 10 MG PO TABS
ORAL_TABLET | ORAL | 11 refills | Status: DC
  Filled 2021-03-26: qty 30, 30d supply, fill #0
  Filled 2021-07-29: qty 30, 30d supply, fill #1

## 2021-03-26 MED ORDER — METFORMIN HCL 1000 MG PO TABS
ORAL_TABLET | Freq: Two times a day (BID) | ORAL | 0 refills | Status: DC
  Filled 2021-03-26: qty 60, 30d supply, fill #0
  Filled 2021-04-23: qty 60, 30d supply, fill #1
  Filled 2021-05-20: qty 60, 30d supply, fill #2

## 2021-03-26 MED ORDER — ALBUTEROL SULFATE HFA 108 (90 BASE) MCG/ACT IN AERS
2.0000 | INHALATION_SPRAY | Freq: Four times a day (QID) | RESPIRATORY_TRACT | 1 refills | Status: DC | PRN
  Filled 2021-03-26: qty 18, 25d supply, fill #0
  Filled 2021-07-29: qty 18, 25d supply, fill #1

## 2021-03-26 MED ORDER — GLIMEPIRIDE 2 MG PO TABS
2.0000 mg | ORAL_TABLET | Freq: Every day | ORAL | 1 refills | Status: DC
  Filled 2021-03-26: qty 30, 30d supply, fill #0
  Filled 2021-04-23: qty 30, 30d supply, fill #1
  Filled 2021-05-20: qty 30, 30d supply, fill #2
  Filled 2021-06-23: qty 30, 30d supply, fill #3

## 2021-03-26 MED ORDER — ATORVASTATIN CALCIUM 40 MG PO TABS
ORAL_TABLET | Freq: Every day | ORAL | 2 refills | Status: DC
  Filled 2021-03-26: qty 30, 30d supply, fill #0
  Filled 2021-04-23: qty 30, 30d supply, fill #1
  Filled 2021-05-20: qty 30, 30d supply, fill #2
  Filled 2021-07-29: qty 30, 30d supply, fill #3

## 2021-03-26 MED ORDER — TRUEPLUS LANCETS 28G MISC
3 refills | Status: DC
  Filled 2021-03-26: qty 100, 25d supply, fill #0
  Filled 2021-07-29: qty 100, 25d supply, fill #1

## 2021-03-26 NOTE — Progress Notes (Signed)
F/u Diabetes

## 2021-03-26 NOTE — Progress Notes (Signed)
Assessment & Plan:  [name] was seen today for diabetes.  Diagnoses and all orders for this visit:  Diabetes mellitus due to underlying condition with hyperosmolarity without coma, without long-term current use of insulin (HCC)  Type 2 diabetes mellitus without complication, without long-term current use of insulin (HCC) -     Glucose (CBG) -     HgB A1c -     glimepiride (AMARYL) 2 MG tablet; Take 1 tablet (2 mg total) by mouth daily with breakfast. -     metFORMIN (GLUCOPHAGE) 1000 MG tablet; TAKE 1 TABLET (1,000 MG TOTAL) BY MOUTH 2 (TWO) TIMES DAILY WITH A MEAL. -     glucose blood (TRUE METRIX BLOOD GLUCOSE TEST) test strip; Use as instructed -     TRUEplus Lancets 28G MISC; USE AS INSTRUCTED -     CMP14+EGFR  Hyperlipidemia LDL goal <70 -     atorvastatin (LIPITOR) 40 MG tablet; TAKE 1 TABLET (40 MG TOTAL) BY MOUTH DAILY.  Environmental allergies -     cetirizine (ZYRTEC) 10 MG tablet; TOME 1 PASTILLA POR VIA ORAL UNA VEZ AL DIA -     albuterol (VENTOLIN HFA) 108 (90 Base) MCG/ACT inhaler; INHALE 2 PUFFS INTO THE LUNGS EVERY 6 (SIX) HOURS AS NEEDED FOR WHEEZING OR SHORTNESS OF BREATH.  Anemia, unspecified type -     CBC  Other orders -     Blood Glucose Monitoring Suppl (TRUE METRIX METER) w/Device KIT; Use as instructed. Check blood glucose level by fingerstick TWICE per day.   Patient has been counseled on age-appropriate routine health concerns for screening and prevention. These are reviewed and up-to-date. Referrals have been placed accordingly. Immunizations are up-to-date or declined.    Subjective:   Chief Complaint  Patient presents with   Diabetes   HPI Kimberly Wilkerson 40 y.o. female presents to office today for DM2. She has a past medical history of Fetal demise (09/11/2012), Gestational diabetes (06/06/2012).    DM2 Blood glucose levels low 70s with taking 45m of glimepiride. So she stopped taking. Still taking metformin 1000 mg BID and decreased  amaryl to 2 mg daily. . Readings at home 160-170s. She checks blood glucose levels in am and postprandial. Son diagnosed with fatty liver. She states she was so busy taking him to appointments that she has not been taking care of herself. Blood pressure is well controlled.  Lab Results  Component Value Date   HGBA1C 8.5 (A) 03/26/2021    Lab Results  Component Value Date   HGBA1C 9.1 (H) 11/01/2020    Lab Results  Component Value Date   LDLCALC 57 11/01/2020    BP Readings from Last 3 Encounters:  03/26/21 119/84  10/24/20 120/80  07/24/20 98/66       Review of Systems  Constitutional:  Negative for fever, malaise/fatigue and weight loss.  HENT: Negative.  Negative for nosebleeds.   Eyes: Negative.  Negative for blurred vision, double vision and photophobia.  Respiratory: Negative.  Negative for cough and shortness of breath.   Cardiovascular: Negative.  Negative for chest pain, palpitations and leg swelling.  Gastrointestinal: Negative.  Negative for heartburn, nausea and vomiting.  Musculoskeletal: Negative.  Negative for myalgias.  Neurological: Negative.  Negative for dizziness, focal weakness, seizures and headaches.  Endo/Heme/Allergies:  Positive for environmental allergies.  Psychiatric/Behavioral: Negative.  Negative for suicidal ideas.    Past Medical History:  Diagnosis Date   Fetal demise 09/11/2012   Gestational diabetes    Gonorrhea 2005  Type 2 diabetes mellitus complicating pregnancy, antepartum 06/06/2012    Past Surgical History:  Procedure Laterality Date   NO PAST SURGERIES      Family History  Problem Relation Age of Onset   Diabetes Mellitus II Mother    Breast cancer Cousin     Social History Reviewed with no changes to be made today.   Outpatient Medications Prior to Visit  Medication Sig Dispense Refill   albuterol (VENTOLIN HFA) 108 (90 Base) MCG/ACT inhaler INHALE 2 PUFFS INTO THE LUNGS EVERY 6 (SIX) HOURS AS NEEDED FOR WHEEZING OR  SHORTNESS OF BREATH. 18 g 1   atorvastatin (LIPITOR) 40 MG tablet TAKE 1 TABLET (40 MG TOTAL) BY MOUTH DAILY. 90 tablet 2   cetirizine (ZYRTEC) 10 MG tablet TOME 1 PASTILLA POR VIA ORAL UNA VEZ AL DIA 30 tablet 11   glimepiride (AMARYL) 4 MG tablet TAKE 1 TABLET (4 MG TOTAL) BY MOUTH DAILY BEFORE BREAKFAST. 90 tablet 0   glucose blood (TRUE METRIX BLOOD GLUCOSE TEST) test strip Use as instructed 100 each 12   metFORMIN (GLUCOPHAGE) 1000 MG tablet TAKE 1 TABLET (1,000 MG TOTAL) BY MOUTH 2 (TWO) TIMES DAILY WITH A MEAL. 180 tablet 0   TRUEplus Lancets 28G MISC USE AS INSTRUCTED 100 each 3   No facility-administered medications prior to visit.    No Known Allergies     Objective:    BP 119/84   Pulse 80   Resp 16   Wt 162 lb (73.5 kg)   SpO2 98%   BMI 32.72 kg/m  Wt Readings from Last 3 Encounters:  03/26/21 162 lb (73.5 kg)  10/24/20 156 lb 9.6 oz (71 kg)  07/24/20 161 lb (73 kg)    Physical Exam Vitals and nursing note reviewed.  Constitutional:      Appearance: She is well-developed.  HENT:     Head: Normocephalic and atraumatic.  Cardiovascular:     Rate and Rhythm: Normal rate and regular rhythm.     Heart sounds: Normal heart sounds. No murmur heard.   No friction rub. No gallop.  Pulmonary:     Effort: Pulmonary effort is normal. No tachypnea or respiratory distress.     Breath sounds: Normal breath sounds. No decreased breath sounds, wheezing, rhonchi or rales.  Chest:     Chest wall: No tenderness.  Abdominal:     General: Bowel sounds are normal.     Palpations: Abdomen is soft.  Musculoskeletal:        General: Normal range of motion.     Cervical back: Normal range of motion.  Skin:    General: Skin is warm and dry.  Neurological:     Mental Status: She is alert and oriented to person, place, and time.     Coordination: Coordination normal.  Psychiatric:        Behavior: Behavior normal. Behavior is cooperative.        Thought Content: Thought content  normal.        Judgment: Judgment normal.         Patient has been counseled extensively about nutrition and exercise as well as the importance of adherence with medications and regular follow-up. The patient was given clear instructions to go to ER or return to medical center if symptoms don't improve, worsen or new problems develop. The patient verbalized understanding.   Follow-up: Return for 6 weeks LUKE see me in 3 months.   Gildardo Pounds, FNP-BC [facility] and  [facility], [place]   03/28/2021, 10:37 PM

## 2021-03-27 LAB — CBC
Hematocrit: 39.6 % (ref 34.0–46.6)
Hemoglobin: 13.4 g/dL (ref 11.1–15.9)
MCH: 30.7 pg (ref 26.6–33.0)
MCHC: 33.8 g/dL (ref 31.5–35.7)
MCV: 91 fL (ref 79–97)
Platelets: 294 10*3/uL (ref 150–450)
RBC: 4.36 x10E6/uL (ref 3.77–5.28)
RDW: 12 % (ref 11.7–15.4)
WBC: 7.4 10*3/uL (ref 3.4–10.8)

## 2021-03-27 LAB — CMP14+EGFR
ALT: 26 IU/L (ref 0–32)
AST: 19 IU/L (ref 0–40)
Albumin/Globulin Ratio: 1.6 (ref 1.2–2.2)
Albumin: 4.5 g/dL (ref 3.8–4.8)
Alkaline Phosphatase: 78 IU/L (ref 44–121)
BUN/Creatinine Ratio: 19 (ref 9–23)
BUN: 11 mg/dL (ref 6–24)
Bilirubin Total: 0.5 mg/dL (ref 0.0–1.2)
CO2: 20 mmol/L (ref 20–29)
Calcium: 9.7 mg/dL (ref 8.7–10.2)
Chloride: 100 mmol/L (ref 96–106)
Creatinine, Ser: 0.57 mg/dL (ref 0.57–1.00)
Globulin, Total: 2.9 g/dL (ref 1.5–4.5)
Glucose: 156 mg/dL — ABNORMAL HIGH (ref 65–99)
Potassium: 3.9 mmol/L (ref 3.5–5.2)
Sodium: 136 mmol/L (ref 134–144)
Total Protein: 7.4 g/dL (ref 6.0–8.5)
eGFR: 117 mL/min/{1.73_m2} (ref >59)

## 2021-03-28 ENCOUNTER — Encounter: Payer: Self-pay | Admitting: Nurse Practitioner

## 2021-04-23 ENCOUNTER — Other Ambulatory Visit: Payer: Self-pay

## 2021-05-05 ENCOUNTER — Ambulatory Visit: Payer: No Typology Code available for payment source | Admitting: Pharmacist

## 2021-05-20 ENCOUNTER — Encounter: Payer: Self-pay | Admitting: Pharmacist

## 2021-05-20 ENCOUNTER — Ambulatory Visit: Payer: Self-pay | Attending: Nurse Practitioner | Admitting: Pharmacist

## 2021-05-20 ENCOUNTER — Other Ambulatory Visit: Payer: Self-pay

## 2021-05-20 DIAGNOSIS — E1165 Type 2 diabetes mellitus with hyperglycemia: Secondary | ICD-10-CM

## 2021-05-20 NOTE — Progress Notes (Signed)
   S:    PCP: Zelda  Patient arrives in good spirits. Presents for diabetes evaluation, education, and management. Patient was referred and last seen by Zelda on 03/26/2021.   Today, patient reports no complaints. She admits that her A1c has been increasing secondary to "not taking care of herself." She admits to being inactive and to dietary indiscretion as summarized below.   Family/Social History:  - FHx: DM (mother) - Tobacco: never smoker - Alcohol: denies   Insurance coverage/medication affordability: self pay   Patient reports adherence with medications.  Current diabetes medications include: metformin 1000 mg BID, glimepiride 2 mg daily  Current hypertension medications include: none  Current hyperlipidemia medications include: atorvastatin 40 mg  Patient denies hypoglycemic events.  Patient reported dietary habits:  - Admits to dietary indiscretion. Has not been taking care of herself d/t providing for her oldest son.   Patient-reported exercise habits:  - Has not been exercising lately. Reports that she had covid a couple of weeks ago. Additionally, most of her time has been devoted to caring for her oldest son and she hasn't had time to exercise.    Patient denies nocturia (nighttime urination).  Patient denies neuropathy (nerve pain). Patient denies visual changes. Patient reports self foot exams.    O:  Lab Results  Component Value Date   HGBA1C 8.5 (A) 03/26/2021   There were no vitals filed for this visit.  Lipid Panel     Component Value Date/Time   CHOL 142 11/01/2020 1628   TRIG 187 (H) 11/01/2020 1628   HDL 54 11/01/2020 1628   CHOLHDL 2.6 11/01/2020 1628   CHOLHDL 5.0 (H) 10/21/2016 1016   VLDL 45 (H) 10/21/2016 1016   LDLCALC 57 11/01/2020 1628   Brings her meter.  Averages as follows:  7 day: 142 14 day: 149 30 day: 134  Clinical Atherosclerotic Cardiovascular Disease (ASCVD): No  The 10-year ASCVD risk score Denman George DC Jr., et al., 2013)  is: 0.5%   Values used to calculate the score:     Age: 40 years     Sex: Female     Is Non-Hispanic African American: No     Diabetic: Yes     Tobacco smoker: No     Systolic Blood Pressure: 119 mmHg     Is BP treated: No     HDL Cholesterol: 54 mg/dL     Total Cholesterol: 142 mg/dL   A/P: Diabetes longstanding currently uncontrolled based on most recent A1c. This is secondary to sedentary lifestyle and dietary indiscretion. She does tell me today that she has made an effort to "restart" and is motivated to sustain those changes.  Patient is able to verbalize appropriate hypoglycemia management plan. Patient is adherent with medication. She agrees to modify regimen at f/u in 1 month if her CBGs are not at goal.  -Continue current regimen -Extensively discussed pathophysiology of diabetes, recommended lifestyle interventions, dietary effects on blood sugar control -Counseled on s/sx of and management of hypoglycemia -Next A1C anticipated 06/2021.   Written patient instructions provided.  Total time in face to face counseling 15 minutes.   Follow up with me in 1 month.    Butch Penny, PharmD, Patsy [place], CPP Clinical Pharmacist [facility] [phone]

## 2021-06-23 ENCOUNTER — Other Ambulatory Visit: Payer: Self-pay

## 2021-06-23 ENCOUNTER — Encounter: Payer: Self-pay | Admitting: Nurse Practitioner

## 2021-06-23 ENCOUNTER — Other Ambulatory Visit: Payer: Self-pay | Admitting: Nurse Practitioner

## 2021-06-23 ENCOUNTER — Ambulatory Visit: Payer: Self-pay | Attending: Nurse Practitioner | Admitting: Nurse Practitioner

## 2021-06-23 VITALS — BP 107/74 | HR 71 | Ht 59.0 in | Wt 158.5 lb

## 2021-06-23 DIAGNOSIS — E1165 Type 2 diabetes mellitus with hyperglycemia: Secondary | ICD-10-CM

## 2021-06-23 DIAGNOSIS — Z23 Encounter for immunization: Secondary | ICD-10-CM

## 2021-06-23 LAB — POCT GLYCOSYLATED HEMOGLOBIN (HGB A1C): Hemoglobin A1C: 7.5 % — AB (ref 4.0–5.6)

## 2021-06-23 LAB — GLUCOSE, POCT (MANUAL RESULT ENTRY): POC Glucose: 114 mg/dl — AB (ref 70–99)

## 2021-06-23 MED ORDER — GLIMEPIRIDE 4 MG PO TABS
4.0000 mg | ORAL_TABLET | Freq: Every day | ORAL | 1 refills | Status: DC
  Filled 2021-06-23: qty 90, 90d supply, fill #0

## 2021-06-23 MED ORDER — GLIMEPIRIDE 2 MG PO TABS
2.0000 mg | ORAL_TABLET | Freq: Every day | ORAL | 1 refills | Status: DC
  Filled 2021-06-23: qty 90, 90d supply, fill #0
  Filled 2021-07-29: qty 30, 30d supply, fill #0
  Filled 2021-08-28: qty 30, 30d supply, fill #1

## 2021-06-23 MED ORDER — METFORMIN HCL 1000 MG PO TABS
1000.0000 mg | ORAL_TABLET | Freq: Two times a day (BID) | ORAL | 1 refills | Status: DC
  Filled 2021-06-23: qty 60, 30d supply, fill #0
  Filled 2021-07-29: qty 60, 30d supply, fill #1
  Filled 2021-08-28: qty 60, 30d supply, fill #2

## 2021-06-23 NOTE — Progress Notes (Signed)
Assessment & Plan:  Kimberly Wilkerson was seen today for diabetes.  Diagnoses and all orders for this visit:  Type 2 diabetes mellitus with hyperglycemia, without long-term current use of insulin (HCC) -     POCT glucose (manual entry) -     POCT glycosylated hemoglobin (Hb A1C) -   -     glimepiride (AMARYL) 2 MG tablet; Take 1 tablet (2 mg total) by mouth daily with breakfast. Continue blood sugar control as discussed in office today, low carbohydrate diet, and regular physical exercise as tolerated, 150 minutes per week (30 min each day, 5 days per week, or 50 min 3 days per week). Keep blood sugar logs with fasting goal of 90-130 mg/dl, post prandial (after you eat) less than 180.  For Hypoglycemia: BS <60 and Hyperglycemia BS >400; contact the clinic ASAP. Annual eye exams and foot exams are recommended.   Patient has been counseled on age-appropriate routine health concerns for screening and prevention. These are reviewed and up-to-date. Referrals have been placed accordingly. Immunizations are up-to-date or declined.    Subjective:   Chief Complaint  Patient presents with   Diabetes   Kimberly Wilkerson 40 y.o. female presents to office today for follow up to DM.    has a past medical history of Fetal demise (09/11/2012), Gestational diabetes, Gonorrhea (2005), and Type 2 diabetes mellitus complicating pregnancy, antepartum (06/06/2012).  VRI was used to communicate directly with patient for the entire encounter including providing detailed patient instructions.    DM She does not want to increase glimepiride today. States it makes her sick when she take sit and causes her blood sugars to be low during the day. She will continue on metformin 1000 mg BID as well. Blood pressure Is well controlled. LDL at goal with atorvastatin. Blood glucose home averages: AM 160-170  Post prandial 160-180s We discussed reducing carbs and consuming more vegetables. Also would like to see a 10lb weight loss.   Lab Results  Component Value Date   HGBA1C 7.5 (A) 06/23/2021    Lab Results  Component Value Date   HGBA1C 8.5 (A) 03/26/2021    Lab Results  Component Value Date   LDLCALC 57 11/01/2020    BP Readings from Last 3 Encounters:  06/23/21 107/74  03/26/21 119/84  10/24/20 120/80    Review of Systems  Constitutional:  Negative for fever, malaise/fatigue and weight loss.  HENT: Negative.  Negative for nosebleeds.   Eyes: Negative.  Negative for blurred vision, double vision and photophobia.  Respiratory: Negative.  Negative for cough and shortness of breath.   Cardiovascular: Negative.  Negative for chest pain, palpitations and leg swelling.  Gastrointestinal: Negative.  Negative for heartburn, nausea and vomiting.  Musculoskeletal: Negative.  Negative for myalgias.  Neurological: Negative.  Negative for dizziness, focal weakness, seizures and headaches.  Psychiatric/Behavioral: Negative.  Negative for suicidal ideas.    Past Medical History:  Diagnosis Date   Fetal demise 09/11/2012   Gestational diabetes    Gonorrhea 2005   Type 2 diabetes mellitus complicating pregnancy, antepartum 06/06/2012    Past Surgical History:  Procedure Laterality Date   NO PAST SURGERIES      Family History  Problem Relation Age of Onset   Diabetes Mellitus II Mother    Breast cancer Cousin     Social History Reviewed with no changes to be made today.   Outpatient Medications Prior to Visit  Medication Sig Dispense Refill   albuterol (VENTOLIN HFA) 108 (90 Base)  MCG/ACT inhaler INHALE 2 PUFFS INTO THE LUNGS EVERY 6 (SIX) HOURS AS NEEDED FOR WHEEZING OR SHORTNESS OF BREATH. 18 g 1   atorvastatin (LIPITOR) 40 MG tablet TAKE 1 TABLET (40 MG TOTAL) BY MOUTH DAILY. 90 tablet 2   Blood Glucose Monitoring Suppl (TRUE METRIX METER) w/Device KIT Use as instructed. Check blood glucose level by fingerstick TWICE per day. 1 kit 0   cetirizine (ZYRTEC) 10 MG tablet TOME 1 PASTILLA POR VIA ORAL UNA  VEZ AL DIA 30 tablet 11   glucose blood (TRUE METRIX BLOOD GLUCOSE TEST) test strip Use as instructed 100 each 12   TRUEplus Lancets 28G MISC USE AS INSTRUCTED 100 each 3   glimepiride (AMARYL) 2 MG tablet Take 1 tablet (2 mg total) by mouth daily with breakfast. 90 tablet 1   metFORMIN (GLUCOPHAGE) 1000 MG tablet TAKE 1 TABLET (1,000 MG TOTAL) BY MOUTH 2 (TWO) TIMES DAILY WITH A MEAL. 180 tablet 0   No facility-administered medications prior to visit.    No Known Allergies     Objective:    BP 107/74   Pulse 71   Ht $R'4\' 11"'sw$  (1.499 m)   Wt 158 lb 8 oz (71.9 kg)   SpO2 98%   BMI 32.01 kg/m  Wt Readings from Last 3 Encounters:  06/23/21 158 lb 8 oz (71.9 kg)  03/26/21 162 lb (73.5 kg)  10/24/20 156 lb 9.6 oz (71 kg)    Physical Exam Vitals and nursing note reviewed.  Constitutional:      Appearance: She is well-developed.  HENT:     Head: Normocephalic and atraumatic.  Cardiovascular:     Rate and Rhythm: Normal rate and regular rhythm.     Heart sounds: Normal heart sounds. No murmur heard.   No friction rub. No gallop.  Pulmonary:     Effort: Pulmonary effort is normal. No tachypnea or respiratory distress.     Breath sounds: Normal breath sounds. No decreased breath sounds, wheezing, rhonchi or rales.  Chest:     Chest wall: No tenderness.  Abdominal:     General: Bowel sounds are normal.     Palpations: Abdomen is soft.  Musculoskeletal:        General: Normal range of motion.     Cervical back: Normal range of motion.  Skin:    General: Skin is warm and dry.  Neurological:     Mental Status: She is alert and oriented to person, place, and time.     Coordination: Coordination normal.  Psychiatric:        Behavior: Behavior normal. Behavior is cooperative.        Thought Content: Thought content normal.        Judgment: Judgment normal.         Patient has been counseled extensively about nutrition and exercise as well as the importance of adherence with  medications and regular follow-up. The patient was given clear instructions to go to ER or return to medical center if symptoms don't improve, worsen or new problems develop. The patient verbalized understanding.   Follow-up: Return in about 3 months (around 09/23/2021).   Gildardo Pounds, FNP-BC [facility] and [place], [place]   06/23/2021, 3:55 PM

## 2021-06-25 ENCOUNTER — Other Ambulatory Visit: Payer: Self-pay

## 2021-07-29 ENCOUNTER — Other Ambulatory Visit: Payer: Self-pay

## 2021-08-28 ENCOUNTER — Other Ambulatory Visit: Payer: Self-pay

## 2021-09-26 ENCOUNTER — Encounter: Payer: Self-pay | Admitting: Nurse Practitioner

## 2021-09-26 ENCOUNTER — Other Ambulatory Visit: Payer: Self-pay

## 2021-09-26 ENCOUNTER — Ambulatory Visit: Payer: Self-pay | Attending: Nurse Practitioner | Admitting: Nurse Practitioner

## 2021-09-26 VITALS — BP 111/79 | HR 75 | Ht 59.0 in | Wt 160.4 lb

## 2021-09-26 DIAGNOSIS — E785 Hyperlipidemia, unspecified: Secondary | ICD-10-CM

## 2021-09-26 DIAGNOSIS — E1165 Type 2 diabetes mellitus with hyperglycemia: Secondary | ICD-10-CM

## 2021-09-26 LAB — POCT GLYCOSYLATED HEMOGLOBIN (HGB A1C): Hemoglobin A1C: 8.1 % — AB (ref 4.0–5.6)

## 2021-09-26 LAB — GLUCOSE, POCT (MANUAL RESULT ENTRY): POC Glucose: 138 mg/dl — AB (ref 70–99)

## 2021-09-26 MED ORDER — ATORVASTATIN CALCIUM 40 MG PO TABS
ORAL_TABLET | Freq: Every day | ORAL | 2 refills | Status: DC
  Filled 2021-09-26: qty 90, fill #0
  Filled 2021-09-26: qty 30, 30d supply, fill #0
  Filled 2021-11-19: qty 30, 30d supply, fill #1
  Filled 2021-12-22: qty 30, 30d supply, fill #2
  Filled 2022-01-22: qty 30, 30d supply, fill #3
  Filled 2022-02-25: qty 30, 30d supply, fill #4
  Filled 2022-04-07: qty 30, 30d supply, fill #5

## 2021-09-26 MED ORDER — TRUE METRIX BLOOD GLUCOSE TEST VI STRP
ORAL_STRIP | 12 refills | Status: AC
  Filled 2021-09-26: qty 100, 30d supply, fill #0
  Filled 2021-09-26: qty 100, fill #0

## 2021-09-26 MED ORDER — METFORMIN HCL 1000 MG PO TABS
1000.0000 mg | ORAL_TABLET | Freq: Two times a day (BID) | ORAL | 1 refills | Status: DC
  Filled 2021-09-26: qty 60, 30d supply, fill #0
  Filled 2021-09-26: qty 180, 90d supply, fill #0
  Filled 2021-11-19: qty 60, 30d supply, fill #1
  Filled 2021-12-22: qty 60, 30d supply, fill #2
  Filled 2022-01-22: qty 60, 30d supply, fill #3
  Filled 2022-02-25: qty 60, 30d supply, fill #4
  Filled 2022-04-07: qty 60, 30d supply, fill #5

## 2021-09-26 MED ORDER — TRUEPLUS LANCETS 28G MISC
3 refills | Status: AC
  Filled 2021-09-26: qty 100, fill #0
  Filled 2021-09-26: qty 100, 30d supply, fill #0

## 2021-09-26 NOTE — Progress Notes (Signed)
° °Assessment & Plan:  °Kimberly Wilkerson was seen today for diabetes. ° °Diagnoses and all orders for this visit: ° °Type 2 diabetes mellitus with hyperglycemia, without long-term current use of insulin (HCC) °HOLDING glimeperide today. She will continue with am dose of metformin and take second dose of metformin with lunch as she is skipping most of her dinners °She needs trulicity or victoza but is refusing. May need to try jardiance instead of amaryl due to episodes of hypoglycemia °-     POCT glycosylated hemoglobin (Hb A1C) °-     POCT glucose (manual entry) °-     metFORMIN (GLUCOPHAGE) 1000 MG tablet; Take 1 tablet (1,000 mg total) by mouth 2 (two) times daily with a meal. °-     glucose blood (TRUE METRIX BLOOD GLUCOSE TEST) test strip; Use as instructed °-     TRUEplus Lancets 28G MISC; USE AS INSTRUCTED °-     CMP14+EGFR °-     Microalbumin / creatinine urine ratio ° °Hyperlipidemia LDL goal <70 °-     atorvastatin (LIPITOR) 40 MG tablet; TAKE 1 TABLET (40 MG TOTAL) BY MOUTH DAILY. ° ° ° °Patient has been counseled on age-appropriate routine health concerns for screening and prevention. These are reviewed and up-to-date. Referrals have been placed accordingly. Immunizations are up-to-date or declined.    °Subjective:  ° °Chief Complaint  °Patient presents with  ° Diabetes  ° °Kimberly Wilkerson 40 y.o. female presents to office today for follow up to Diabetes. ° has a past medical history of Fetal demise (09/11/2012), Gestational diabetes, Gonorrhea (2005), and Type 2 diabetes mellitus complicating pregnancy, antepartum (06/06/2012).  ° °VRI was used to communicate directly with patient for the entire encounter including providing detailed patient instructions.   ° °DM °Poorly controlled. She is refusing any injectables today. Currently missing the second evening dose of her metformin as she reports she does not eat dinner. She also states the amaryl is causing her blood sugars to drop as she is taking this along  the same time as her first am dose of metformin. Mornings readings 160 Afternoon readings: 180-200s. She has not had an A1c below 7 in over 2 years.  LDL at goal with atorvastatin 40 mg daily.  °Lab Results  °Component Value Date  ° HGBA1C 8.1 (A) 09/26/2021  °  °Lab Results  °Component Value Date  ° LDLCALC 57 11/01/2020  °  °The 10-year ASCVD risk score (Arnett DK, et al., 2019) is: 0.5% °  Values used to calculate the score: °    Age: 40 years °    Sex: Female °    Is Non-Hispanic African American: No °    Diabetic: Yes °    Tobacco smoker: No °    Systolic Blood Pressure: 111 mmHg °    Is BP treated: No °    HDL Cholesterol: 54 mg/dL °    Total Cholesterol: 142 mg/dL  °Review of Systems  °Constitutional:  Negative for fever, malaise/fatigue and weight loss.  °HENT: Negative.  Negative for nosebleeds.   °Eyes: Negative.  Negative for blurred vision, double vision and photophobia.  °Respiratory: Negative.  Negative for cough and shortness of breath.   °Cardiovascular: Negative.  Negative for chest pain, palpitations and leg swelling.  °Gastrointestinal: Negative.  Negative for heartburn, nausea and vomiting.  °Musculoskeletal: Negative.  Negative for myalgias.  °Neurological: Negative.  Negative for dizziness, focal weakness, seizures and headaches.  °Psychiatric/Behavioral: Negative.  Negative for suicidal ideas.   ° °Past   Past Medical History:  Diagnosis Date   Fetal demise 09/11/2012   Gestational diabetes    Gonorrhea 2005   Type 2 diabetes mellitus complicating pregnancy, antepartum 06/06/2012    Past Surgical History:  Procedure Laterality Date   NO PAST SURGERIES      Family History  Problem Relation Age of Onset   Diabetes Mellitus II Mother    Breast cancer Cousin     Social History Reviewed with no changes to be made today.   Outpatient Medications Prior to Visit  Medication Sig Dispense Refill   albuterol (VENTOLIN HFA) 108 (90 Base) MCG/ACT inhaler INHALE 2 PUFFS INTO THE LUNGS EVERY 6  (SIX) HOURS AS NEEDED FOR WHEEZING OR SHORTNESS OF BREATH. 18 g 1   Blood Glucose Monitoring Suppl (TRUE METRIX METER) w/Device KIT Use as instructed. Check blood glucose level by fingerstick TWICE per day. 1 kit 0   cetirizine (ZYRTEC) 10 MG tablet TOME 1 PASTILLA POR VIA ORAL UNA VEZ AL DIA 30 tablet 11   glimepiride (AMARYL) 2 MG tablet Take 1 tablet (2 mg total) by mouth daily with breakfast. 90 tablet 1   atorvastatin (LIPITOR) 40 MG tablet TAKE 1 TABLET (40 MG TOTAL) BY MOUTH DAILY. 90 tablet 2   glucose blood (TRUE METRIX BLOOD GLUCOSE TEST) test strip Use as instructed 100 each 12   metFORMIN (GLUCOPHAGE) 1000 MG tablet Take 1 tablet (1,000 mg total) by mouth 2 (two) times daily with a meal. 180 tablet 1   TRUEplus Lancets 28G MISC USE AS INSTRUCTED 100 each 3   No facility-administered medications prior to visit.    No Known Allergies     Objective:    BP 111/79    Pulse 75    Ht 4' 11" (1.499 m)    Wt 160 lb 6 oz (72.7 kg)    SpO2 99%    BMI 32.39 kg/m  Wt Readings from Last 3 Encounters:  09/26/21 160 lb 6 oz (72.7 kg)  06/23/21 158 lb 8 oz (71.9 kg)  03/26/21 162 lb (73.5 kg)    Physical Exam Vitals and nursing note reviewed.  Constitutional:      Appearance: She is well-developed.  HENT:     Head: Normocephalic and atraumatic.  Cardiovascular:     Rate and Rhythm: Normal rate and regular rhythm.     Heart sounds: Normal heart sounds. No murmur heard.   No friction rub. No gallop.  Pulmonary:     Effort: Pulmonary effort is normal. No tachypnea or respiratory distress.     Breath sounds: Normal breath sounds. No decreased breath sounds, wheezing, rhonchi or rales.  Chest:     Chest wall: No tenderness.  Abdominal:     General: Bowel sounds are normal.     Palpations: Abdomen is soft.  Musculoskeletal:        General: Normal range of motion.     Cervical back: Normal range of motion.  Skin:    General: Skin is warm and dry.  Neurological:     Mental Status:  She is alert and oriented to person, place, and time.     Coordination: Coordination normal.  Psychiatric:        Behavior: Behavior normal. Behavior is cooperative.        Thought Content: Thought content normal.        Judgment: Judgment normal.         Patient has been counseled extensively about nutrition and exercise as well as the  importance of adherence with medications and regular follow-up. The patient was given clear instructions to go to ER or return to medical center if symptoms don't improve, worsen or new problems develop. The patient verbalized understanding.   Follow-up: Return in about 4 weeks (around 10/24/2021) for [place] in 3 weeks/fasting lipids.  See me in 3 months DM.   Gildardo Pounds, FNP-BC [facility] and [place], [place]   09/26/2021, 3:51 PM

## 2021-09-26 NOTE — Progress Notes (Signed)
Kimberly Wilkerson [id number]

## 2021-09-27 ENCOUNTER — Other Ambulatory Visit: Payer: Self-pay

## 2021-09-27 LAB — MICROALBUMIN / CREATININE URINE RATIO
Creatinine, Urine: 8.5 mg/dL
Microalbumin, Urine: <3 ug/mL

## 2021-09-27 LAB — CMP14+EGFR
ALT: 41 IU/L — ABNORMAL HIGH (ref 0–32)
AST: 32 IU/L (ref 0–40)
Albumin/Globulin Ratio: 1.6 (ref 1.2–2.2)
Albumin: 4.9 g/dL — ABNORMAL HIGH (ref 3.8–4.8)
Alkaline Phosphatase: 90 IU/L (ref 44–121)
BUN/Creatinine Ratio: 20 (ref 9–23)
BUN: 10 mg/dL (ref 6–24)
Bilirubin Total: 0.6 mg/dL (ref 0.0–1.2)
CO2: 25 mmol/L (ref 20–29)
Calcium: 10.1 mg/dL (ref 8.7–10.2)
Chloride: 98 mmol/L (ref 96–106)
Creatinine, Ser: 0.51 mg/dL — ABNORMAL LOW (ref 0.57–1.00)
Globulin, Total: 3 g/dL (ref 1.5–4.5)
Glucose: 113 mg/dL — ABNORMAL HIGH (ref 70–99)
Potassium: 4 mmol/L (ref 3.5–5.2)
Sodium: 139 mmol/L (ref 134–144)
Total Protein: 7.9 g/dL (ref 6.0–8.5)
eGFR: 119 mL/min/{1.73_m2} (ref >59)

## 2021-09-29 ENCOUNTER — Other Ambulatory Visit: Payer: Self-pay

## 2021-10-01 ENCOUNTER — Telehealth: Payer: Self-pay

## 2021-10-01 NOTE — Telephone Encounter (Signed)
Left message to return call to our office.  Vm left with the help of translator Karsten Ro [phone]

## 2021-10-01 NOTE — Telephone Encounter (Signed)
-----   Message from Claiborne Rigg, NP sent at 09/28/2021  8:53 AM EST ----- Slightly elevated liver enzyme. Could be related to diet high in fat. Needs to eat more vegetables, fruits and protein. Avoid fried foods and foods high in fat and cholesterol. Kidney function normal. Glucose elevated.

## 2021-10-22 ENCOUNTER — Ambulatory Visit: Payer: Self-pay | Attending: Nurse Practitioner | Admitting: Pharmacist

## 2021-10-22 ENCOUNTER — Other Ambulatory Visit: Payer: Self-pay

## 2021-10-22 DIAGNOSIS — E1165 Type 2 diabetes mellitus with hyperglycemia: Secondary | ICD-10-CM

## 2021-10-22 MED ORDER — EMPAGLIFLOZIN 10 MG PO TABS
10.0000 mg | ORAL_TABLET | Freq: Every day | ORAL | 2 refills | Status: DC
  Filled 2021-10-22: qty 30, 30d supply, fill #0
  Filled 2021-11-19: qty 30, 30d supply, fill #1
  Filled 2021-12-22 – 2021-12-23 (×2): qty 30, 30d supply, fill #2

## 2021-10-22 NOTE — Progress Notes (Signed)
° °  S:    PCP: Zelda  Patient arrives in good spirits. Presents for diabetes evaluation, education, and management. Patient was referred and last seen by Zelda on 09/26/2021. A1c was 8.1, however, pt admitted to partial compliance with metformin. She also admitted to not taking glimepiride d/t hypoglycemia.   Family/Social History:  - FHx: DM (mother) - Tobacco: never smoker - Alcohol: denies   Insurance coverage/medication affordability: self pay   Patient reports adherence with medications. She stopped the glimepiride d/t hypoglycemia.  Current diabetes medications include: metformin 1000 mg BID, glimepiride 2 mg daily (not taking)  Patient denies hypoglycemic events.  Patient reported dietary habits:  - Admits to dietary indiscretion.   Patient-reported exercise habits:  - None outside of work   Patient denies nocturia (nighttime urination).  Patient denies neuropathy (nerve pain). Patient denies visual changes. Patient reports self foot exams.    O:  Lab Results  Component Value Date   HGBA1C 8.1 (A) 09/26/2021   There were no vitals filed for this visit.  Lipid Panel     Component Value Date/Time   CHOL 142 11/01/2020 1628   TRIG 187 (H) 11/01/2020 1628   HDL 54 11/01/2020 1628   CHOLHDL 2.6 11/01/2020 1628   CHOLHDL 5.0 (H) 10/21/2016 1016   VLDL 45 (H) 10/21/2016 1016   LDLCALC 57 11/01/2020 1628   Tells me her blood sugar is running 135-140  Clinical Atherosclerotic Cardiovascular Disease (ASCVD): No  The 10-year ASCVD risk score (Arnett DK, et al., 2019) is: 0.5%   Values used to calculate the score:     Age: 41 years     Sex: Female     Is Non-Hispanic African American: No     Diabetic: Yes     Tobacco smoker: No     Systolic Blood Pressure: 111 mmHg     Is BP treated: No     HDL Cholesterol: 54 mg/dL     Total Cholesterol: 142 mg/dL   A/P: Diabetes longstanding currently uncontrolled based on most recent A1c. This is secondary to sedentary  lifestyle and dietary indiscretion. Patient is able to verbalize appropriate hypoglycemia management plan. Patient is adherent with medication.  -Continue metformin 1000 mg BID.  -Start Jardiance 10 mg daily.  -Extensively discussed pathophysiology of diabetes, recommended lifestyle interventions, dietary effects on blood sugar control -Counseled on s/sx of and management of hypoglycemia -Next A1C anticipated 12/2021.   Written patient instructions provided.  Total time in face to face counseling 20 minutes.   Follow up with me in 1 month.    Butch Penny, PharmD, [place]y [place], CPP Clinical Pharmacist [facility] & [facility] [phone]

## 2021-11-05 ENCOUNTER — Telehealth: Payer: Self-pay | Admitting: Nurse Practitioner

## 2021-11-05 NOTE — Telephone Encounter (Signed)
I call the Pt to reschedule the appt for 12/26/21 since is a [place], need to be reschedule LVM

## 2021-11-19 ENCOUNTER — Ambulatory Visit: Payer: Self-pay | Attending: Nurse Practitioner | Admitting: Pharmacist

## 2021-11-19 ENCOUNTER — Other Ambulatory Visit: Payer: Self-pay

## 2021-11-19 DIAGNOSIS — E1165 Type 2 diabetes mellitus with hyperglycemia: Secondary | ICD-10-CM

## 2021-11-19 NOTE — Progress Notes (Signed)
? ?  S:    ?PCP: Zelda ? ?Patient arrives in good spirits. Presents for diabetes evaluation, education, and management. Patient was referred and last seen by Zelda on 09/26/2021. A1c was 8.1, however, pt admitted to partial compliance with metformin. She also admitted to not taking glimepiride d/t hypoglycemia. I saw her on 10/22/2021 and started Jardiance.  ? ?Today, pt reports taking the Jardiance as instructed. She tells me that  she is tolerating this well. She is also taking the metformin BID as prescribed.  ? ?Family/Social History:  ?- FHx: DM (mother) ?- Tobacco: never smoker ?- Alcohol: denies  ? ?Insurance coverage/medication affordability: self pay  ? ?Patient reports adherence with medications. ?Current diabetes medications include: metformin 1000 mg BID, Jardiance 10 mg daily.  ? ?Patient denies hypoglycemic events. ? ?Patient reported dietary habits:  ?- Admits to dietary indiscretion.  ?- Since last visit, she has reports "trying to eat healthier" but no specifics given ? ?Patient-reported exercise habits:  ?- 1 hour/2-3 days per week. ?  ?Patient denies nocturia (nighttime urination).  ?Patient denies neuropathy (nerve pain). ?Patient denies visual changes. ?Patient reports self foot exams.  ? ?Reports checking her sugar ~1 hour after breakfast. Reports 90 - 130. ?  ?O:  ?Lab Results  ?Component Value Date  ? HGBA1C 8.1 (A) 09/26/2021  ? ?There were no vitals filed for this visit. ? ?Lipid Panel  ?   ?Component Value Date/Time  ? CHOL 142 11/01/2020 1628  ? TRIG 187 (H) 11/01/2020 1628  ? HDL 54 11/01/2020 1628  ? CHOLHDL 2.6 11/01/2020 1628  ? CHOLHDL 5.0 (H) 10/21/2016 1016  ? VLDL 45 (H) 10/21/2016 1016  ? LDLCALC 57 11/01/2020 1628  ? ?Clinical Atherosclerotic Cardiovascular Disease (ASCVD): No  ?The 10-year ASCVD risk score (Arnett DK, et al., 2019) is: 0.5% ?  Values used to calculate the score: ?    Age: 41 years ?    Sex: Female ?    Is Non-Hispanic African American: No ?    Diabetic: Yes ?     Tobacco smoker: No ?    Systolic Blood Pressure: 111 mmHg ?    Is BP treated: No ?    HDL Cholesterol: 54 mg/dL ?    Total Cholesterol: 142 mg/dL  ? ?A/P: ?Diabetes longstanding currently uncontrolled based on most recent A1c. However, her reported home sugars are at goal since adding Jardiance. Patient is able to verbalize appropriate hypoglycemia management plan. Patient is adherent with medication.  ?-Continue metformin 1000 mg BID.  ?-Continue Jardiance 10 mg daily.  ?-Extensively discussed pathophysiology of diabetes, recommended lifestyle interventions, dietary effects on blood sugar control ?-Counseled on s/sx of and management of hypoglycemia ?-Next A1C anticipated 12/2021.  ? ?Written patient instructions provided.  Total time in face to face counseling 20 minutes.   ?Follow up with me in 1 month.   ? ?Butch Penny, PharmD, BCACP, CPP ?Clinical Pharmacist ?[facility] ?[phone] ? ? ? ?

## 2021-12-19 NOTE — Progress Notes (Signed)
? ?  S:    ?PCP: Zelda ? ?Patient arrives in good spirits. Presents for diabetes evaluation, education, and management. Patient was referred and last seen by Zelda on 09/26/2021. A1c was 8.1, however, pt admitted to partial compliance with metformin. She also admitted to not taking glimepiride d/t hypoglycemia. Franky Macho saw her on 10/22/2021 and started Jardiance. At last visit with [facility] on 11/19/2021, she reported improved home BG since starting Jardiance and home medications were continued.  ? ?Today, patient arrives in good spirits. Visit was completed with use of a video Spanish interpreter Mikle Bosworth, [phone]). She is due for an updated A1c today.  ? ?Family/Social History:  ?- FHx: DM (mother) ?- Tobacco: never smoker ?- Alcohol: denies  ? ?Insurance coverage/medication affordability: self pay  ? ?Patient reports adherence with medications. Denies any adverse effects with her medications.  ? ?Current diabetes medications include: metformin 1000 mg BID, Jardiance 10 mg daily ? ?Patient denies hypoglycemic events. ? ?Patient reported dietary habits:  ?- Reports "trying to eat healthier"  ?- Breakfast: oatmeal, eggs, chicken, green juice ? ?Patient-reported exercise habits:  ?- 1 hour/3-4 days per week ?  ?Patient denies nocturia (nighttime urination).  ?Patient denies neuropathy (nerve pain). ?Patient denies visual changes. ?Patient reports self foot exams.  ? ?Reported home BG: Reports 120-140s (2 hours after eating). At noon usually 80-110s before eating lunch.  ? ?O:  ?Lab Results  ?Component Value Date  ? HGBA1C 6.8 12/22/2021  ? ?There were no vitals filed for this visit. ? ?Lipid Panel  ?   ?Component Value Date/Time  ? CHOL 142 11/01/2020 1628  ? TRIG 187 (H) 11/01/2020 1628  ? HDL 54 11/01/2020 1628  ? CHOLHDL 2.6 11/01/2020 1628  ? CHOLHDL 5.0 (H) 10/21/2016 1016  ? VLDL 45 (H) 10/21/2016 1016  ? LDLCALC 57 11/01/2020 1628  ? ?Clinical Atherosclerotic Cardiovascular Disease (ASCVD): No  ?The 10-year ASCVD risk score  (Arnett DK, et al., 2019) is: 0.5% ?  Values used to calculate the score: ?    Age: 41 years ?    Sex: Female ?    Is Non-Hispanic African American: No ?    Diabetic: Yes ?    Tobacco smoker: No ?    Systolic Blood Pressure: 111 mmHg ?    Is BP treated: No ?    HDL Cholesterol: 54 mg/dL ?    Total Cholesterol: 142 mg/dL  ? ?A/P: ?Diabetes longstanding with A1c of 6.8 today now at goal. Patient is able to verbalize appropriate hypoglycemia management plan. Patient is adherent with medications.  ?-Continue metformin 1000 mg BID.  ?-Continue Jardiance 10 mg daily.  ?-Extensively discussed pathophysiology of diabetes, recommended lifestyle interventions, dietary effects on blood sugar control ?-Counseled on s/sx of and management of hypoglycemia ?-Next A1C anticipated 03/2022.  ? ?Total time in face to face counseling 20 minutes. Follow up with PCP in 1 month.   ? ?Pervis Hocking, PharmD ?PGY2 Ambulatory Care Pharmacy Resident ?12/22/2021 3:50 PM ? ?

## 2021-12-22 ENCOUNTER — Other Ambulatory Visit: Payer: Self-pay | Admitting: Nurse Practitioner

## 2021-12-22 ENCOUNTER — Encounter: Payer: Self-pay | Admitting: Pharmacist

## 2021-12-22 ENCOUNTER — Other Ambulatory Visit: Payer: Self-pay

## 2021-12-22 ENCOUNTER — Ambulatory Visit: Payer: Self-pay | Attending: Nurse Practitioner | Admitting: Pharmacist

## 2021-12-22 DIAGNOSIS — E1165 Type 2 diabetes mellitus with hyperglycemia: Secondary | ICD-10-CM

## 2021-12-22 DIAGNOSIS — Z9109 Other allergy status, other than to drugs and biological substances: Secondary | ICD-10-CM

## 2021-12-22 LAB — POCT GLYCOSYLATED HEMOGLOBIN (HGB A1C): HbA1c, POC (controlled diabetic range): 6.8 % (ref 0.0–7.0)

## 2021-12-22 MED ORDER — ALBUTEROL SULFATE HFA 108 (90 BASE) MCG/ACT IN AERS
2.0000 | INHALATION_SPRAY | Freq: Four times a day (QID) | RESPIRATORY_TRACT | 1 refills | Status: DC | PRN
  Filled 2021-12-22: qty 18, 25d supply, fill #0
  Filled 2022-11-24: qty 18, 25d supply, fill #1

## 2021-12-23 ENCOUNTER — Other Ambulatory Visit: Payer: Self-pay

## 2021-12-23 LAB — BASIC METABOLIC PANEL
BUN/Creatinine Ratio: 19 (ref 9–23)
BUN: 12 mg/dL (ref 6–24)
CO2: 22 mmol/L (ref 20–29)
Calcium: 9.8 mg/dL (ref 8.7–10.2)
Chloride: 103 mmol/L (ref 96–106)
Creatinine, Ser: 0.62 mg/dL (ref 0.57–1.00)
Glucose: 133 mg/dL — ABNORMAL HIGH (ref 70–99)
Potassium: 3.9 mmol/L (ref 3.5–5.2)
Sodium: 141 mmol/L (ref 134–144)
eGFR: 114 mL/min/{1.73_m2} (ref >59)

## 2021-12-26 ENCOUNTER — Ambulatory Visit: Payer: No Typology Code available for payment source | Admitting: Nurse Practitioner

## 2021-12-26 ENCOUNTER — Other Ambulatory Visit: Payer: Self-pay

## 2022-01-22 ENCOUNTER — Other Ambulatory Visit: Payer: Self-pay

## 2022-01-22 ENCOUNTER — Ambulatory Visit: Payer: Self-pay | Attending: Nurse Practitioner | Admitting: Pharmacist

## 2022-01-22 NOTE — Progress Notes (Deleted)
? ?  S:    ?PCP: Zelda ? ?Patient arrives in good spirits. Presents for diabetes evaluation, education, and management. Patient was referred and last seen by Zelda on 09/26/2021. A1c was 8.1, however, pt admitted to partial compliance with metformin. She also admitted to not taking glimepiride d/t hypoglycemia. Franky Macho saw her on 10/22/2021 and started Jardiance. At last visit with [facility] on 11/19/2021, she reported improved home BG since starting Jardiance and home medications were continued.  ? ?Today, patient arrives in good spirits. Visit was completed with use of a video Spanish interpreter Mikle Bosworth, [phone]). She is due for an updated A1c today.  ? ?Family/Social History:  ?- FHx: DM (mother) ?- Tobacco: never smoker ?- Alcohol: denies  ? ?Insurance coverage/medication affordability: self pay  ? ?Patient reports adherence with medications. Denies any adverse effects with her medications.  ? ?Current diabetes medications include: metformin 1000 mg BID, Jardiance 10 mg daily ? ?Patient denies hypoglycemic events. ? ?Patient reported dietary habits:  ?- Reports "trying to eat healthier"  ?- Breakfast: oatmeal, eggs, chicken, green juice ? ?Patient-reported exercise habits:  ?- 1 hour/3-4 days per week ?  ?Patient denies nocturia (nighttime urination).  ?Patient denies neuropathy (nerve pain). ?Patient denies visual changes. ?Patient reports self foot exams.  ? ?Reported home BG: Reports 120-140s (2 hours after eating). At noon usually 80-110s before eating lunch.  ? ?O:  ?Lab Results  ?Component Value Date  ? HGBA1C 6.8 12/22/2021  ? ?There were no vitals filed for this visit. ? ?Lipid Panel  ?   ?Component Value Date/Time  ? CHOL 142 11/01/2020 1628  ? TRIG 187 (H) 11/01/2020 1628  ? HDL 54 11/01/2020 1628  ? CHOLHDL 2.6 11/01/2020 1628  ? CHOLHDL 5.0 (H) 10/21/2016 1016  ? VLDL 45 (H) 10/21/2016 1016  ? LDLCALC 57 11/01/2020 1628  ? ?Clinical Atherosclerotic Cardiovascular Disease (ASCVD): No  ?The 10-year ASCVD risk score  (Arnett DK, et al., 2019) is: 0.5% ?  Values used to calculate the score: ?    Age: 41 years ?    Sex: Female ?    Is Non-Hispanic African American: No ?    Diabetic: Yes ?    Tobacco smoker: No ?    Systolic Blood Pressure: 111 mmHg ?    Is BP treated: No ?    HDL Cholesterol: 54 mg/dL ?    Total Cholesterol: 142 mg/dL  ? ?A/P: ?Diabetes longstanding with A1c of 6.8 today now at goal. Patient is able to verbalize appropriate hypoglycemia management plan. Patient is adherent with medications.  ?-Continue metformin 1000 mg BID.  ?-Continue Jardiance 10 mg daily.  ?-Extensively discussed pathophysiology of diabetes, recommended lifestyle interventions, dietary effects on blood sugar control ?-Counseled on s/sx of and management of hypoglycemia ?-Next A1C anticipated 03/2022.  ? ?Total time in face to face counseling 20 minutes. Follow up with PCP in 1 month.   ? ?Pervis Hocking, PharmD ?PGY2 Ambulatory Care Pharmacy Resident ?01/22/2022 3:34 PM ? ?

## 2022-02-06 ENCOUNTER — Other Ambulatory Visit: Payer: Self-pay

## 2022-02-25 ENCOUNTER — Other Ambulatory Visit: Payer: Self-pay

## 2022-04-07 ENCOUNTER — Other Ambulatory Visit: Payer: Self-pay

## 2022-04-17 ENCOUNTER — Other Ambulatory Visit: Payer: Self-pay

## 2022-04-24 ENCOUNTER — Encounter: Payer: Self-pay | Admitting: Nurse Practitioner

## 2022-04-24 ENCOUNTER — Ambulatory Visit: Payer: Self-pay | Attending: Nurse Practitioner | Admitting: Nurse Practitioner

## 2022-04-24 VITALS — BP 102/68 | HR 76 | Temp 98.0°F | Ht 59.0 in | Wt 144.0 lb

## 2022-04-24 DIAGNOSIS — E1165 Type 2 diabetes mellitus with hyperglycemia: Secondary | ICD-10-CM

## 2022-04-24 DIAGNOSIS — Z1231 Encounter for screening mammogram for malignant neoplasm of breast: Secondary | ICD-10-CM

## 2022-04-24 DIAGNOSIS — R5382 Chronic fatigue, unspecified: Secondary | ICD-10-CM

## 2022-04-24 DIAGNOSIS — E559 Vitamin D deficiency, unspecified: Secondary | ICD-10-CM

## 2022-04-24 DIAGNOSIS — Z23 Encounter for immunization: Secondary | ICD-10-CM

## 2022-04-24 DIAGNOSIS — E785 Hyperlipidemia, unspecified: Secondary | ICD-10-CM

## 2022-04-24 LAB — POCT GLYCOSYLATED HEMOGLOBIN (HGB A1C): HbA1c, POC (controlled diabetic range): 6.6 % (ref 0.0–7.0)

## 2022-04-24 LAB — GLUCOSE, POCT (MANUAL RESULT ENTRY): POC Glucose: 110 mg/dl — AB (ref 70–99)

## 2022-04-24 NOTE — Progress Notes (Signed)
Assessment & Plan:  Kimberly Wilkerson was seen today for diabetes.  Diagnoses and all orders for this visit:  Type 2 diabetes mellitus with hyperglycemia, without long-term current use of insulin (HCC) -     POCT glycosylated hemoglobin (Hb A1C) -     POCT glucose (manual entry) -     Ambulatory referral to Ophthalmology -     CMP14+EGFR -     Thyroid Panel With TSH  Breast cancer screening by mammogram -     MS DIGITAL SCREENING TOMO BILATERAL; Future  Need for Tdap vaccination -     Tdap vaccine greater than or equal to 7yo IM  Dyslipidemia, goal LDL below 100 -     Lipid panel  Chronic fatigue -     CMP14+EGFR -     CBC with Differential -     Thyroid Panel With TSH -     VITAMIN D 25 Hydroxy (Vit-D Deficiency, Fractures)  Vitamin D deficiency disease -     VITAMIN D 25 Hydroxy (Vit-D Deficiency, Fractures)    Patient has been counseled on age-appropriate routine health concerns for screening and prevention. These are reviewed and up-to-date. Referrals have been placed accordingly. Immunizations are up-to-date or declined.    Subjective:   Chief Complaint  Patient presents with   Diabetes   HPI Kimberly Wilkerson 41 y.o. female presents to office today for follow up to DM2.  She has a past medical history of Fetal demise (09/11/2012), Gonorrhea (2005), and DM 2   Children went to [place] and she had been very stressed while they were out of the country visiting her mother. She states she has been very tired lately   DM 2 Well controlled at this time. She is taking Jardiance 10 mg daily and metformin 1000 mg BID. LDl at goal with atorvastatin 40 mg daily.  Lab Results  Component Value Date   HGBA1C 6.6 04/24/2022    Lab Results  Component Value Date   HGBA1C 6.8 12/22/2021    Lab Results  Component Value Date   LDLCALC 57 11/01/2020      Review of Systems  Constitutional:  Positive for malaise/fatigue. Negative for fever and weight loss.  HENT: Negative.   Negative for nosebleeds.   Eyes: Negative.  Negative for blurred vision, double vision and photophobia.  Respiratory: Negative.  Negative for cough and shortness of breath.   Cardiovascular: Negative.  Negative for chest pain, palpitations and leg swelling.  Gastrointestinal: Negative.  Negative for heartburn, nausea and vomiting.  Musculoskeletal: Negative.  Negative for myalgias.  Neurological: Negative.  Negative for dizziness, focal weakness, seizures and headaches.  Psychiatric/Behavioral: Negative.  Negative for suicidal ideas.     Past Medical History:  Diagnosis Date   Fetal demise 09/11/2012   Gestational diabetes    Gonorrhea 2005   Type 2 diabetes mellitus complicating pregnancy, antepartum 06/06/2012    Past Surgical History:  Procedure Laterality Date   NO PAST SURGERIES      Family History  Problem Relation Age of Onset   Diabetes Mellitus II Mother    Breast cancer Cousin     Social History Reviewed with no changes to be made today.   Outpatient Medications Prior to Visit  Medication Sig Dispense Refill   albuterol (VENTOLIN HFA) 108 (90 Base) MCG/ACT inhaler INHALE 2 PUFFS INTO THE LUNGS EVERY 6 (SIX) HOURS AS NEEDED FOR WHEEZING OR SHORTNESS OF BREATH. 18 g 1   atorvastatin (LIPITOR) 40 MG tablet TAKE 1  TABLET (40 MG TOTAL) BY MOUTH DAILY. 90 tablet 2   Blood Glucose Monitoring Suppl (TRUE METRIX METER) w/Device KIT Use as instructed. Check blood glucose level by fingerstick TWICE per day. 1 kit 0   cetirizine (ZYRTEC) 10 MG tablet TOME 1 PASTILLA POR VIA ORAL UNA VEZ AL DIA 30 tablet 11   empagliflozin (JARDIANCE) 10 MG TABS tablet Take 1 tablet (10 mg total) by mouth daily before breakfast. 30 tablet 2   glucose blood (TRUE METRIX BLOOD GLUCOSE TEST) test strip Use as instructed 100 each 12   metFORMIN (GLUCOPHAGE) 1000 MG tablet Take 1 tablet (1,000 mg total) by mouth 2 (two) times daily with a meal. 180 tablet 1   TRUEplus Lancets 28G MISC USE AS INSTRUCTED  100 each 3   No facility-administered medications prior to visit.    No Known Allergies     Objective:    BP 102/68   Pulse 76   Temp 98 F (36.7 C) (Oral)   Ht $R'4\' 11"'Om$  (1.499 m)   Wt 144 lb (65.3 kg)   SpO2 97%   BMI 29.08 kg/m  Wt Readings from Last 3 Encounters:  04/24/22 144 lb (65.3 kg)  09/26/21 160 lb 6 oz (72.7 kg)  06/23/21 158 lb 8 oz (71.9 kg)    Physical Exam Vitals and nursing note reviewed.  Constitutional:      Appearance: She is well-developed.  HENT:     Head: Normocephalic and atraumatic.  Cardiovascular:     Rate and Rhythm: Normal rate and regular rhythm.     Heart sounds: Normal heart sounds. No murmur heard.    No friction rub. No gallop.  Pulmonary:     Effort: Pulmonary effort is normal. No tachypnea or respiratory distress.     Breath sounds: Normal breath sounds. No decreased breath sounds, wheezing, rhonchi or rales.  Chest:     Chest wall: No tenderness.  Abdominal:     General: Bowel sounds are normal.     Palpations: Abdomen is soft.  Musculoskeletal:        General: Normal range of motion.     Cervical back: Normal range of motion.  Skin:    General: Skin is warm and dry.  Neurological:     Mental Status: She is alert and oriented to person, place, and time.     Coordination: Coordination normal.  Psychiatric:        Behavior: Behavior normal. Behavior is cooperative.        Thought Content: Thought content normal.        Judgment: Judgment normal.          Patient has been counseled extensively about nutrition and exercise as well as the importance of adherence with medications and regular follow-up. The patient was given clear instructions to go to ER or return to medical center if symptoms don't improve, worsen or new problems develop. The patient verbalized understanding.   Follow-up: Return in about 3 months (around 07/25/2022).   Gildardo Pounds, FNP-BC [facility] and [facility],  [place]   04/24/2022, 4:14 PM

## 2022-04-24 NOTE — Patient Instructions (Signed)
SUPER B VITAMIN COMPLEX for energy

## 2022-04-24 NOTE — Progress Notes (Signed)
Pt states she has been feeling tired for over a month.

## 2022-04-25 LAB — CBC WITH DIFFERENTIAL/PLATELET
Basophils Absolute: 0.1 10*3/uL (ref 0.0–0.2)
Basos: 1 %
EOS (ABSOLUTE): 1.2 10*3/uL — ABNORMAL HIGH (ref 0.0–0.4)
Eos: 13 %
Hematocrit: 40.8 % (ref 34.0–46.6)
Hemoglobin: 14.4 g/dL (ref 11.1–15.9)
Immature Grans (Abs): 0 10*3/uL (ref 0.0–0.1)
Immature Granulocytes: 0 %
Lymphocytes Absolute: 2.4 10*3/uL (ref 0.7–3.1)
Lymphs: 27 %
MCH: 31 pg (ref 26.6–33.0)
MCHC: 35.3 g/dL (ref 31.5–35.7)
MCV: 88 fL (ref 79–97)
Monocytes Absolute: 0.7 10*3/uL (ref 0.1–0.9)
Monocytes: 8 %
Neutrophils Absolute: 4.7 10*3/uL (ref 1.4–7.0)
Neutrophils: 51 %
Platelets: 289 10*3/uL (ref 150–450)
RBC: 4.65 x10E6/uL (ref 3.77–5.28)
RDW: 13.3 % (ref 11.7–15.4)
WBC: 9.2 10*3/uL (ref 3.4–10.8)

## 2022-04-25 LAB — LIPID PANEL
Chol/HDL Ratio: 3 ratio (ref 0.0–4.4)
Cholesterol, Total: 190 mg/dL (ref 100–199)
HDL: 63 mg/dL (ref >39)
LDL Chol Calc (NIH): 90 mg/dL (ref 0–99)
Triglycerides: 224 mg/dL — ABNORMAL HIGH (ref 0–149)
VLDL Cholesterol Cal: 37 mg/dL (ref 5–40)

## 2022-04-25 LAB — CMP14+EGFR
ALT: 19 IU/L (ref 0–32)
AST: 19 IU/L (ref 0–40)
Albumin/Globulin Ratio: 1.5 (ref 1.2–2.2)
Albumin: 4.8 g/dL (ref 3.9–4.9)
Alkaline Phosphatase: 83 IU/L (ref 44–121)
BUN/Creatinine Ratio: 22 (ref 9–23)
BUN: 13 mg/dL (ref 6–24)
Bilirubin Total: 0.8 mg/dL (ref 0.0–1.2)
CO2: 18 mmol/L — ABNORMAL LOW (ref 20–29)
Calcium: 10.3 mg/dL — ABNORMAL HIGH (ref 8.7–10.2)
Chloride: 100 mmol/L (ref 96–106)
Creatinine, Ser: 0.6 mg/dL (ref 0.57–1.00)
Globulin, Total: 3.2 g/dL (ref 1.5–4.5)
Glucose: 91 mg/dL (ref 70–99)
Potassium: 4 mmol/L (ref 3.5–5.2)
Sodium: 139 mmol/L (ref 134–144)
Total Protein: 8 g/dL (ref 6.0–8.5)
eGFR: 115 mL/min/{1.73_m2} (ref >59)

## 2022-04-25 LAB — THYROID PANEL WITH TSH
Free Thyroxine Index: 2.1 (ref 1.2–4.9)
T3 Uptake Ratio: 27 % (ref 24–39)
T4, Total: 7.6 ug/dL (ref 4.5–12.0)
TSH: 1.31 u[IU]/mL (ref 0.450–4.500)

## 2022-04-25 LAB — VITAMIN D 25 HYDROXY (VIT D DEFICIENCY, FRACTURES): Vit D, 25-Hydroxy: 24.9 ng/mL — ABNORMAL LOW (ref 30.0–100.0)

## 2022-04-26 ENCOUNTER — Other Ambulatory Visit: Payer: Self-pay | Admitting: Nurse Practitioner

## 2022-04-26 DIAGNOSIS — D721 Eosinophilia, unspecified: Secondary | ICD-10-CM

## 2022-05-11 ENCOUNTER — Other Ambulatory Visit: Payer: Self-pay

## 2022-05-11 DIAGNOSIS — Z1231 Encounter for screening mammogram for malignant neoplasm of breast: Secondary | ICD-10-CM

## 2022-05-15 ENCOUNTER — Other Ambulatory Visit: Payer: Self-pay | Admitting: Nurse Practitioner

## 2022-05-15 ENCOUNTER — Other Ambulatory Visit: Payer: Self-pay

## 2022-05-15 DIAGNOSIS — E1165 Type 2 diabetes mellitus with hyperglycemia: Secondary | ICD-10-CM

## 2022-05-15 MED ORDER — METFORMIN HCL 1000 MG PO TABS
1000.0000 mg | ORAL_TABLET | Freq: Two times a day (BID) | ORAL | 1 refills | Status: DC
  Filled 2022-05-15: qty 180, 90d supply, fill #0

## 2022-05-15 MED ORDER — EMPAGLIFLOZIN 10 MG PO TABS
10.0000 mg | ORAL_TABLET | Freq: Every day | ORAL | 2 refills | Status: DC
  Filled 2022-05-15: qty 30, 30d supply, fill #0

## 2022-05-28 ENCOUNTER — Ambulatory Visit
Admission: RE | Admit: 2022-05-28 | Discharge: 2022-05-28 | Disposition: A | Discharge status: Home / Self Care | Payer: No Typology Code available for payment source | Source: Ambulatory Visit | Attending: Nurse Practitioner | Admitting: Nurse Practitioner

## 2022-05-28 DIAGNOSIS — Z1231 Encounter for screening mammogram for malignant neoplasm of breast: Secondary | ICD-10-CM

## 2022-06-02 ENCOUNTER — Ambulatory Visit: Payer: No Typology Code available for payment source

## 2022-06-08 ENCOUNTER — Ambulatory Visit: Payer: Self-pay | Attending: Nurse Practitioner

## 2022-06-08 DIAGNOSIS — D721 Eosinophilia, unspecified: Secondary | ICD-10-CM

## 2022-06-09 LAB — B12 AND FOLATE PANEL
Folate: 15.5 ng/mL (ref >3.0)
Vitamin B-12: 513 pg/mL (ref 232–1245)

## 2022-06-09 LAB — CBC WITH DIFFERENTIAL/PLATELET
Basophils Absolute: 0.1 10*3/uL (ref 0.0–0.2)
Basos: 1 %
EOS (ABSOLUTE): 0.6 10*3/uL — ABNORMAL HIGH (ref 0.0–0.4)
Eos: 8 %
Hematocrit: 41 % (ref 34.0–46.6)
Hemoglobin: 13.8 g/dL (ref 11.1–15.9)
Immature Grans (Abs): 0 10*3/uL (ref 0.0–0.1)
Immature Granulocytes: 0 %
Lymphocytes Absolute: 2.2 10*3/uL (ref 0.7–3.1)
Lymphs: 27 %
MCH: 30.1 pg (ref 26.6–33.0)
MCHC: 33.7 g/dL (ref 31.5–35.7)
MCV: 89 fL (ref 79–97)
Monocytes Absolute: 0.6 10*3/uL (ref 0.1–0.9)
Monocytes: 8 %
Neutrophils Absolute: 4.8 10*3/uL (ref 1.4–7.0)
Neutrophils: 56 %
Platelets: 290 10*3/uL (ref 150–450)
RBC: 4.59 x10E6/uL (ref 3.77–5.28)
RDW: 13 % (ref 11.7–15.4)
WBC: 8.4 10*3/uL (ref 3.4–10.8)

## 2022-07-07 ENCOUNTER — Other Ambulatory Visit: Payer: Self-pay

## 2022-07-31 ENCOUNTER — Other Ambulatory Visit: Payer: Self-pay

## 2022-07-31 ENCOUNTER — Ambulatory Visit: Payer: Self-pay | Attending: Nurse Practitioner | Admitting: Nurse Practitioner

## 2022-07-31 ENCOUNTER — Encounter: Payer: Self-pay | Admitting: Nurse Practitioner

## 2022-07-31 VITALS — BP 102/67 | HR 68 | Temp 98.0°F | Ht 59.0 in | Wt 144.0 lb

## 2022-07-31 DIAGNOSIS — Z23 Encounter for immunization: Secondary | ICD-10-CM

## 2022-07-31 DIAGNOSIS — E785 Hyperlipidemia, unspecified: Secondary | ICD-10-CM

## 2022-07-31 DIAGNOSIS — E1165 Type 2 diabetes mellitus with hyperglycemia: Secondary | ICD-10-CM

## 2022-07-31 LAB — POCT GLYCOSYLATED HEMOGLOBIN (HGB A1C): HbA1c, POC (controlled diabetic range): 6.8 % (ref 0.0–7.0)

## 2022-07-31 MED ORDER — EMPAGLIFLOZIN 10 MG PO TABS
10.0000 mg | ORAL_TABLET | Freq: Every day | ORAL | 2 refills | Status: DC
  Filled 2022-07-31: qty 90, 90d supply, fill #0

## 2022-07-31 MED ORDER — METFORMIN HCL 1000 MG PO TABS
1000.0000 mg | ORAL_TABLET | Freq: Two times a day (BID) | ORAL | 2 refills | Status: DC
  Filled 2022-07-31 – 2022-09-09 (×2): qty 180, 90d supply, fill #0
  Filled 2022-11-24: qty 180, 90d supply, fill #1
  Filled 2023-05-07: qty 180, 90d supply, fill #2

## 2022-07-31 MED ORDER — ATORVASTATIN CALCIUM 40 MG PO TABS
ORAL_TABLET | Freq: Every day | ORAL | 2 refills | Status: DC
  Filled 2022-07-31: qty 90, 90d supply, fill #0
  Filled 2022-11-24: qty 90, 90d supply, fill #1
  Filled 2023-02-24: qty 90, 90d supply, fill #2

## 2022-07-31 NOTE — Progress Notes (Signed)
Assessment & Plan:  Kimberly Wilkerson was seen today for diabetes.  Diagnoses and all orders for this visit:  Type 2 diabetes mellitus with hyperglycemia, without long-term current use of insulin (HCC) -     POCT glycosylated hemoglobin (Hb A1C) -     CMP14+EGFR -     metFORMIN (GLUCOPHAGE) 1000 MG tablet; Take 1 tablet (1,000 mg total) by mouth 2 (two) times daily with a meal. -     empagliflozin (JARDIANCE) 10 MG TABS tablet; Take 1 tablet (10 mg total) by mouth daily before breakfast.  Hyperlipidemia LDL goal <70 -     atorvastatin (LIPITOR) 40 MG tablet; TAKE 1 TABLET (40 MG TOTAL) BY MOUTH DAILY.    Patient has been counseled on age-appropriate routine health concerns for screening and prevention. These are reviewed and up-to-date. Referrals have been placed accordingly. Immunizations are up-to-date or declined.    Subjective:   Chief Complaint  Patient presents with   Diabetes   Diabetes Pertinent negatives for hypoglycemia include no dizziness, headaches or seizures. Pertinent negatives for diabetes include no blurred vision, no chest pain and no weight loss.   Kimberly Wilkerson 41 y.o. female presents to office today for follow up to DM.   She has a past medical history of Fetal demise (09/11/2012), Gestational diabetes, Gonorrhea (2005), and DM2  VRI was used to communicate directly with patient for the entire encounter including providing detailed patient instructions.     DM 2 Diabetes is well controlled and she endorses adherence taking Jardiance 10 mg daily and metformin 1000 mg twice daily.  LDL not quite at goal with atorvastatin 40 mg daily. Lab Results  Component Value Date   HGBA1C 6.8 07/31/2022    Lab Results  Component Value Date   HGBA1C 6.6 04/24/2022    Lab Results  Component Value Date   LDLCALC 90 04/24/2022    BP Readings from Last 3 Encounters:  07/31/22 102/67  04/24/22 102/68  09/26/21 111/79     Review of Systems  Constitutional:   Negative for fever, malaise/fatigue and weight loss.  HENT: Negative.  Negative for nosebleeds.   Eyes: Negative.  Negative for blurred vision, double vision and photophobia.  Respiratory: Negative.  Negative for cough and shortness of breath.   Cardiovascular: Negative.  Negative for chest pain, palpitations and leg swelling.  Gastrointestinal: Negative.  Negative for heartburn, nausea and vomiting.  Musculoskeletal: Negative.  Negative for myalgias.  Neurological: Negative.  Negative for dizziness, focal weakness, seizures and headaches.  Psychiatric/Behavioral: Negative.  Negative for suicidal ideas.     Past Medical History:  Diagnosis Date   Fetal demise 09/11/2012   Gestational diabetes    Gonorrhea 2005   Type 2 diabetes mellitus complicating pregnancy, antepartum 06/06/2012    Past Surgical History:  Procedure Laterality Date   NO PAST SURGERIES      Family History  Problem Relation Age of Onset   Diabetes Mellitus II Mother    Breast cancer Cousin     Social History Reviewed with no changes to be made today.   Outpatient Medications Prior to Visit  Medication Sig Dispense Refill   albuterol (VENTOLIN HFA) 108 (90 Base) MCG/ACT inhaler INHALE 2 PUFFS INTO THE LUNGS EVERY 6 (SIX) HOURS AS NEEDED FOR WHEEZING OR SHORTNESS OF BREATH. 18 g 1   Blood Glucose Monitoring Suppl (TRUE METRIX METER) w/Device KIT Use as instructed. Check blood glucose level by fingerstick TWICE per day. 1 kit 0   cetirizine (ZYRTEC) 10  MG tablet TOME 1 PASTILLA POR VIA ORAL UNA VEZ AL DIA 30 tablet 11   glucose blood (TRUE METRIX BLOOD GLUCOSE TEST) test strip Use as instructed 100 each 12   TRUEplus Lancets 28G MISC USE AS INSTRUCTED 100 each 3   atorvastatin (LIPITOR) 40 MG tablet TAKE 1 TABLET (40 MG TOTAL) BY MOUTH DAILY. 90 tablet 2   empagliflozin (JARDIANCE) 10 MG TABS tablet Take 1 tablet (10 mg total) by mouth daily before breakfast. 30 tablet 2   metFORMIN (GLUCOPHAGE) 1000 MG tablet  Take 1 tablet (1,000 mg total) by mouth 2 (two) times daily with a meal. 180 tablet 1   No facility-administered medications prior to visit.    No Known Allergies     Objective:    BP 102/67   Pulse 68   Temp 98 F (36.7 C) (Temporal)   Ht _0  (1.499 m)   Wt 144 lb (65.3 kg)   SpO2 98%   BMI 29.08 kg/m  Wt Readings from Last 3 Encounters:  07/31/22 144 lb (65.3 kg)  04/24/22 144 lb (65.3 kg)  09/26/21 160 lb 6 oz (72.7 kg)    Physical Exam Vitals and nursing note reviewed.  Constitutional:      Appearance: She is well-developed.  HENT:     Head: Normocephalic and atraumatic.  Cardiovascular:     Rate and Rhythm: Normal rate and regular rhythm.     Heart sounds: Normal heart sounds. No murmur heard.    No friction rub. No gallop.  Pulmonary:     Effort: Pulmonary effort is normal. No tachypnea or respiratory distress.     Breath sounds: Normal breath sounds. No decreased breath sounds, wheezing, rhonchi or rales.  Chest:     Chest wall: No tenderness.  Abdominal:     General: Bowel sounds are normal.     Palpations: Abdomen is soft.  Musculoskeletal:        General: Normal range of motion.     Cervical back: Normal range of motion.  Skin:    General: Skin is warm and dry.  Neurological:     Mental Status: She is alert and oriented to person, place, and time.     Coordination: Coordination normal.  Psychiatric:        Behavior: Behavior normal. Behavior is cooperative.        Thought Content: Thought content normal.        Judgment: Judgment normal.          Patient has been counseled extensively about nutrition and exercise as well as the importance of adherence with medications and regular follow-up. The patient was given clear instructions to go to ER or return to medical center if symptoms don't improve, worsen or new problems develop. The patient verbalized understanding.   Follow-up: Return in about 6 months (around 01/29/2023) for DM 2.   Gildardo Pounds, FNP-BC [facility] and [facility], [place]   07/31/2022, 1:51 PM

## 2022-08-01 LAB — CMP14+EGFR
ALT: 17 IU/L (ref 0–32)
AST: 18 IU/L (ref 0–40)
Albumin/Globulin Ratio: 1.6 (ref 1.2–2.2)
Albumin: 4.9 g/dL (ref 3.9–4.9)
Alkaline Phosphatase: 87 IU/L (ref 44–121)
BUN/Creatinine Ratio: 21 (ref 9–23)
BUN: 10 mg/dL (ref 6–24)
Bilirubin Total: 1.1 mg/dL (ref 0.0–1.2)
CO2: 21 mmol/L (ref 20–29)
Calcium: 10.1 mg/dL (ref 8.7–10.2)
Chloride: 100 mmol/L (ref 96–106)
Creatinine, Ser: 0.47 mg/dL — ABNORMAL LOW (ref 0.57–1.00)
Globulin, Total: 3.1 g/dL (ref 1.5–4.5)
Glucose: 105 mg/dL — ABNORMAL HIGH (ref 70–99)
Potassium: 3.9 mmol/L (ref 3.5–5.2)
Sodium: 139 mmol/L (ref 134–144)
Total Protein: 8 g/dL (ref 6.0–8.5)
eGFR: 121 mL/min/{1.73_m2} (ref >59)

## 2022-08-03 ENCOUNTER — Other Ambulatory Visit: Payer: Self-pay

## 2022-09-09 ENCOUNTER — Other Ambulatory Visit: Payer: Self-pay

## 2022-10-01 ENCOUNTER — Other Ambulatory Visit: Payer: Self-pay

## 2022-10-09 ENCOUNTER — Other Ambulatory Visit: Payer: Self-pay | Admitting: Nurse Practitioner

## 2022-10-09 ENCOUNTER — Other Ambulatory Visit: Payer: Self-pay

## 2022-10-09 DIAGNOSIS — E1165 Type 2 diabetes mellitus with hyperglycemia: Secondary | ICD-10-CM

## 2022-10-09 MED ORDER — EMPAGLIFLOZIN 10 MG PO TABS: 10.0000 mg | ORAL_TABLET | Freq: Every day | ORAL | 2 refills | Status: DC

## 2022-10-12 ENCOUNTER — Other Ambulatory Visit: Payer: Self-pay

## 2022-11-24 ENCOUNTER — Other Ambulatory Visit: Payer: Self-pay

## 2023-01-29 ENCOUNTER — Encounter: Payer: Self-pay | Admitting: Nurse Practitioner

## 2023-01-29 ENCOUNTER — Other Ambulatory Visit: Payer: Self-pay

## 2023-01-29 ENCOUNTER — Ambulatory Visit: Payer: Self-pay | Attending: Nurse Practitioner | Admitting: Nurse Practitioner

## 2023-01-29 VITALS — BP 108/68 | HR 77 | Ht 59.0 in | Wt 150.6 lb

## 2023-01-29 DIAGNOSIS — E785 Hyperlipidemia, unspecified: Secondary | ICD-10-CM

## 2023-01-29 DIAGNOSIS — Z9109 Other allergy status, other than to drugs and biological substances: Secondary | ICD-10-CM

## 2023-01-29 DIAGNOSIS — Z7985 Long-term (current) use of injectable non-insulin antidiabetic drugs: Secondary | ICD-10-CM

## 2023-01-29 DIAGNOSIS — Z7984 Long term (current) use of oral hypoglycemic drugs: Secondary | ICD-10-CM

## 2023-01-29 DIAGNOSIS — E1165 Type 2 diabetes mellitus with hyperglycemia: Secondary | ICD-10-CM

## 2023-01-29 LAB — POCT GLYCOSYLATED HEMOGLOBIN (HGB A1C): Hemoglobin A1C: 7.8 % — AB (ref 4.0–5.6)

## 2023-01-29 MED ORDER — ALBUTEROL SULFATE HFA 108 (90 BASE) MCG/ACT IN AERS
2.0000 | INHALATION_SPRAY | Freq: Four times a day (QID) | RESPIRATORY_TRACT | 1 refills | Status: AC | PRN
  Filled 2023-01-29: qty 6.7, 25d supply, fill #0

## 2023-01-29 MED ORDER — EMPAGLIFLOZIN 10 MG PO TABS
10.0000 mg | ORAL_TABLET | Freq: Every day | ORAL | 2 refills | Status: DC
  Filled 2023-01-29: qty 30, 30d supply, fill #0

## 2023-01-29 NOTE — Progress Notes (Signed)
Assessment & Plan:  Kimberly Wilkerson was seen today for diabetes.  Diagnoses and all orders for this visit:  Type 2 diabetes mellitus with hyperglycemia, without long-term current use of insulin (HCC) PICK UP JARDIANCE FROM DOWNSTAIRS -     POCT glycosylated hemoglobin (Hb A1C) -     CMP14+EGFR -     Microalbumin / creatinine urine ratio -     empagliflozin (JARDIANCE) 10 MG TABS tablet; Take 1 tablet (10 mg total) by mouth daily before breakfast.  Hyperlipidemia LDL goal <70 -     Lipid panel  Environmental allergies -     albuterol (VENTOLIN HFA) 108 (90 Base) MCG/ACT inhaler; INHALE 2 PUFFS INTO THE LUNGS EVERY 6 (SIX) HOURS AS NEEDED FOR WHEEZING OR SHORTNESS OF BREATH.    Patient has been counseled on age-appropriate routine health concerns for screening and prevention. These are reviewed and up-to-date. Referrals have been placed accordingly. Immunizations are up-to-date or declined.    Subjective:   Chief Complaint  Patient presents with   Diabetes    Kimberly Wilkerson 42 y.o. female presents to office today for follow up to DM  She has a past medical history of Fetal demise (09/11/2012), Gestational diabetes, Gonorrhea (2005), and DM2   VRI was used to communicate directly with patient for the entire encounter including providing detailed patient instructions.      Patient has been counseled on age-appropriate routine health concerns for screening and prevention. These are reviewed and up-to-date. Referrals have been placed accordingly. Immunizations are up-to-date or declined.     PAP SMEAR: UTD MAMMOGRAM: UTD    DM 2 Diabetes is not well controlled. She has been out of Jardiance. States she has not received it through the mail order program. She states she needs to renew the patient assistance program. She is currently taking metformin 1000 mg twice daily.  LDL not quite at goal with atorvastatin 40 mg daily. Lab Results  Component Value Date   HGBA1C 7.8 (A)  01/29/2023    Lab Results  Component Value Date   HGBA1C 6.8 07/31/2022    Lab Results  Component Value Date   LDLCALC 90 04/24/2022      BP Readings from Last 3 Encounters:  01/29/23 108/68  07/31/22 102/67  04/24/22 102/68     Review of Systems  Constitutional:  Negative for fever, malaise/fatigue and weight loss.  HENT: Negative.  Negative for nosebleeds.   Eyes: Negative.  Negative for blurred vision, double vision and photophobia.  Respiratory: Negative.  Negative for cough and shortness of breath.   Cardiovascular: Negative.  Negative for chest pain, palpitations and leg swelling.  Gastrointestinal: Negative.  Negative for heartburn, nausea and vomiting.  Musculoskeletal: Negative.  Negative for myalgias.  Neurological: Negative.  Negative for dizziness, focal weakness, seizures and headaches.  Psychiatric/Behavioral: Negative.  Negative for suicidal ideas.     Past Medical History:  Diagnosis Date   Fetal demise 09/11/2012   Gestational diabetes    Gonorrhea 2005   Type 2 diabetes mellitus complicating pregnancy, antepartum 06/06/2012    Past Surgical History:  Procedure Laterality Date   NO PAST SURGERIES      Family History  Problem Relation Age of Onset   Diabetes Mellitus II Mother    Breast cancer Cousin     Social History Reviewed with no changes to be made today.   Outpatient Medications Prior to Visit  Medication Sig Dispense Refill   atorvastatin (LIPITOR) 40 MG tablet TAKE 1 TABLET (40  MG TOTAL) BY MOUTH DAILY. 90 tablet 2   Blood Glucose Monitoring Suppl (TRUE METRIX METER) w/Device KIT Use as instructed. Check blood glucose level by fingerstick TWICE per day. 1 kit 0   glucose blood (TRUE METRIX BLOOD GLUCOSE TEST) test strip Use as instructed 100 each 12   metFORMIN (GLUCOPHAGE) 1000 MG tablet Take 1 tablet (1,000 mg total) by mouth 2 (two) times daily with a meal. 180 tablet 2   albuterol (VENTOLIN HFA) 108 (90 Base) MCG/ACT inhaler INHALE 2  PUFFS INTO THE LUNGS EVERY 6 (SIX) HOURS AS NEEDED FOR WHEEZING OR SHORTNESS OF BREATH. 18 g 1   empagliflozin (JARDIANCE) 10 MG TABS tablet Take 1 tablet (10 mg total) by mouth daily before breakfast. 90 tablet 2   cetirizine (ZYRTEC) 10 MG tablet TOME 1 PASTILLA POR VIA ORAL UNA VEZ AL DIA (Patient not taking: Reported on 01/29/2023) 30 tablet 11   No facility-administered medications prior to visit.    No Known Allergies     Objective:    BP 108/68 (BP Location: Left Arm, Patient Position: Sitting, Cuff Size: Normal)   Pulse 77   Ht 4\' 11"  (1.499 m)   Wt 150 lb 9.6 oz (68.3 kg)   LMP  (LMP Unknown)   SpO2 98%   BMI 30.42 kg/m  Wt Readings from Last 3 Encounters:  01/29/23 150 lb 9.6 oz (68.3 kg)  07/31/22 144 lb (65.3 kg)  04/24/22 144 lb (65.3 kg)    Physical Exam Vitals and nursing note reviewed.  Constitutional:      Appearance: She is well-developed.  HENT:     Head: Normocephalic and atraumatic.  Cardiovascular:     Rate and Rhythm: Normal rate and regular rhythm.     Heart sounds: Normal heart sounds. No murmur heard.    No friction rub. No gallop.  Pulmonary:     Effort: Pulmonary effort is normal. No tachypnea or respiratory distress.     Breath sounds: Normal breath sounds. No decreased breath sounds, wheezing, rhonchi or rales.  Chest:     Chest wall: No tenderness.  Abdominal:     General: Bowel sounds are normal.     Palpations: Abdomen is soft.  Musculoskeletal:        General: Normal range of motion.     Cervical back: Normal range of motion.  Skin:    General: Skin is warm and dry.  Neurological:     Mental Status: She is alert and oriented to person, place, and time.     Coordination: Coordination normal.  Psychiatric:        Behavior: Behavior normal. Behavior is cooperative.        Thought Content: Thought content normal.        Judgment: Judgment normal.          Patient has been counseled extensively about nutrition and exercise as  well as the importance of adherence with medications and regular follow-up. The patient was given clear instructions to go to ER or return to medical center if symptoms don't improve, worsen or new problems develop. The patient verbalized understanding.   Follow-up: Return in about 3 months (around 05/01/2023).   Claiborne Rigg, FNP-BC [facility] [place], [place] [phone]   01/29/2023, 2:02 PM

## 2023-01-30 LAB — CMP14+EGFR
ALT: 21 IU/L (ref 0–32)
AST: 20 IU/L (ref 0–40)
Albumin/Globulin Ratio: 1.6 (ref 1.2–2.2)
Albumin: 4.7 g/dL (ref 3.9–4.9)
Alkaline Phosphatase: 95 IU/L (ref 44–121)
BUN/Creatinine Ratio: 24 — ABNORMAL HIGH (ref 9–23)
BUN: 12 mg/dL (ref 6–24)
Bilirubin Total: 0.8 mg/dL (ref 0.0–1.2)
CO2: 20 mmol/L (ref 20–29)
Calcium: 9.9 mg/dL (ref 8.7–10.2)
Chloride: 103 mmol/L (ref 96–106)
Creatinine, Ser: 0.5 mg/dL — ABNORMAL LOW (ref 0.57–1.00)
Globulin, Total: 2.9 g/dL (ref 1.5–4.5)
Glucose: 109 mg/dL — ABNORMAL HIGH (ref 70–99)
Potassium: 3.9 mmol/L (ref 3.5–5.2)
Sodium: 139 mmol/L (ref 134–144)
Total Protein: 7.6 g/dL (ref 6.0–8.5)
eGFR: 119 mL/min/{1.73_m2} (ref >59)

## 2023-01-30 LAB — LIPID PANEL
Chol/HDL Ratio: 4.6 ratio — ABNORMAL HIGH (ref 0.0–4.4)
Cholesterol, Total: 268 mg/dL — ABNORMAL HIGH (ref 100–199)
HDL: 58 mg/dL (ref >39)
LDL Chol Calc (NIH): 162 mg/dL — ABNORMAL HIGH (ref 0–99)
Triglycerides: 258 mg/dL — ABNORMAL HIGH (ref 0–149)
VLDL Cholesterol Cal: 48 mg/dL — ABNORMAL HIGH (ref 5–40)

## 2023-01-30 LAB — MICROALBUMIN / CREATININE URINE RATIO
Creatinine, Urine: 53.2 mg/dL
Microalb/Creat Ratio: 18 mg/g creat (ref 0–29)
Microalbumin, Urine: 9.6 ug/mL

## 2023-02-05 ENCOUNTER — Other Ambulatory Visit: Payer: Self-pay

## 2023-02-08 ENCOUNTER — Other Ambulatory Visit: Payer: Self-pay

## 2023-02-24 ENCOUNTER — Other Ambulatory Visit: Payer: Self-pay

## 2023-05-07 ENCOUNTER — Other Ambulatory Visit: Payer: Self-pay

## 2023-05-07 ENCOUNTER — Other Ambulatory Visit: Payer: Self-pay | Admitting: Pharmacist

## 2023-05-07 ENCOUNTER — Other Ambulatory Visit: Payer: Self-pay | Admitting: Nurse Practitioner

## 2023-05-07 ENCOUNTER — Ambulatory Visit: Payer: Self-pay | Attending: Nurse Practitioner | Admitting: Nurse Practitioner

## 2023-05-07 ENCOUNTER — Encounter: Payer: Self-pay | Admitting: Nurse Practitioner

## 2023-05-07 VITALS — BP 130/70 | HR 69 | Ht 59.0 in | Wt 151.8 lb

## 2023-05-07 DIAGNOSIS — Z7984 Long term (current) use of oral hypoglycemic drugs: Secondary | ICD-10-CM

## 2023-05-07 DIAGNOSIS — E785 Hyperlipidemia, unspecified: Secondary | ICD-10-CM

## 2023-05-07 DIAGNOSIS — E7800 Pure hypercholesterolemia, unspecified: Secondary | ICD-10-CM

## 2023-05-07 DIAGNOSIS — E1165 Type 2 diabetes mellitus with hyperglycemia: Secondary | ICD-10-CM

## 2023-05-07 DIAGNOSIS — E559 Vitamin D deficiency, unspecified: Secondary | ICD-10-CM

## 2023-05-07 LAB — POCT GLYCOSYLATED HEMOGLOBIN (HGB A1C): Hemoglobin A1C: 8.1 % — AB (ref 4.0–5.6)

## 2023-05-07 MED ORDER — METFORMIN HCL 1000 MG PO TABS
1000.0000 mg | ORAL_TABLET | Freq: Two times a day (BID) | ORAL | 2 refills | Status: DC
Start: 2023-05-07 — End: 2023-11-24
  Filled 2023-07-28: qty 180, 90d supply, fill #0

## 2023-05-07 MED ORDER — ATORVASTATIN CALCIUM 40 MG PO TABS
40.0000 mg | ORAL_TABLET | Freq: Every day | ORAL | 2 refills | Status: DC
Start: 2023-05-07 — End: 2024-07-06
  Filled 2023-05-07: qty 90, fill #0
  Filled 2023-08-09: qty 90, 90d supply, fill #0

## 2023-05-07 MED ORDER — EMPAGLIFLOZIN 25 MG PO TABS
25.0000 mg | ORAL_TABLET | Freq: Every day | ORAL | 1 refills | Status: DC
Start: 2023-05-07 — End: 2023-05-25

## 2023-05-07 MED ORDER — EMPAGLIFLOZIN 25 MG PO TABS
25.0000 mg | ORAL_TABLET | Freq: Every day | ORAL | 1 refills | Status: DC
Start: 2023-05-07 — End: 2023-05-07
  Filled 2023-05-07 (×2): qty 90, 90d supply, fill #0

## 2023-05-07 NOTE — Progress Notes (Signed)
Assessment & Plan:  Kimberly Wilkerson was seen today for medical management of chronic issues.  Diagnoses and all orders for this visit:  Type 2 diabetes mellitus with hyperglycemia, without long-term current use of insulin (HCC) Increased Jardiance -     POCT glycosylated hemoglobin (Hb A1C) -     empagliflozin (JARDIANCE) 25 MG TABS tablet; Take 1 tablet (25 mg total) by mouth daily before breakfast. -     metFORMIN (GLUCOPHAGE) 1000 MG tablet; Take 1 tablet (1,000 mg total) by mouth 2 (two) times daily with a meal. -     CMP14+EGFR  Hypercholesterolemia -     atorvastatin (LIPITOR) 40 MG tablet; Take 1 tablet (40 mg total) by mouth daily. INSTRUCTIONS: Work on a low fat, heart healthy diet and participate in regular aerobic exercise program by working out at least 150 minutes per week; 5 days a week-30 minutes per day. Avoid red meat/beef/steak,  fried foods. junk foods, sodas, sugary drinks, unhealthy snacking, alcohol and smoking.  Drink at least 80 oz of water per day and monitor your carbohydrate intake daily.    Vitamin D deficiency disease -     VITAMIN D 25 Hydroxy (Vit-D Deficiency, Fractures)    Patient has been counseled on age-appropriate routine health concerns for screening and prevention. These are reviewed and up-to-date. Referrals have been placed accordingly. Immunizations are up-to-date or declined.    Subjective:   Chief Complaint  Patient presents with   Medical Management of Chronic Issues   HPI Kimberly Wilkerson 42 y.o. female presents to office today for follow up to DM.   She has a past medical history of Fetal demise (09/11/2012), Gestational diabetes, Gonorrhea (2005), and DM2   VRI was used to communicate directly with patient for the entire encounter including providing detailed patient instructions.    DM 2 She has not been exercising. A1c is up from 7.8 to 8.1 today. She is currently taking jardiance 10 mg daily and metformin 1000 mg BID.  Lab Results   Component Value Date   HGBA1C 8.1 (A) 05/07/2023    Lab Results  Component Value Date   HGBA1C 7.8 (A) 01/29/2023     Review of Systems  Constitutional:  Negative for fever, malaise/fatigue and weight loss.  HENT: Negative.  Negative for nosebleeds.   Eyes: Negative.  Negative for blurred vision, double vision and photophobia.  Respiratory: Negative.  Negative for cough and shortness of breath.   Cardiovascular: Negative.  Negative for chest pain, palpitations and leg swelling.  Gastrointestinal: Negative.  Negative for heartburn, nausea and vomiting.  Musculoskeletal: Negative.  Negative for myalgias.  Neurological: Negative.  Negative for dizziness, focal weakness, seizures and headaches.  Psychiatric/Behavioral: Negative.  Negative for suicidal ideas.     Past Medical History:  Diagnosis Date   Fetal demise 09/11/2012   Gestational diabetes    Gonorrhea 2005   Type 2 diabetes mellitus complicating pregnancy, antepartum 06/06/2012    Past Surgical History:  Procedure Laterality Date   NO PAST SURGERIES      Family History  Problem Relation Age of Onset   Diabetes Mellitus II Mother    Breast cancer Cousin     Social History Reviewed with no changes to be made today.   Outpatient Medications Prior to Visit  Medication Sig Dispense Refill   albuterol (VENTOLIN HFA) 108 (90 Base) MCG/ACT inhaler INHALE 2 PUFFS INTO THE LUNGS EVERY 6 (SIX) HOURS AS NEEDED FOR WHEEZING OR SHORTNESS OF BREATH. 6.7 g 1  Blood Glucose Monitoring Suppl (TRUE METRIX METER) w/Device KIT Use as instructed. Check blood glucose level by fingerstick TWICE per day. 1 kit 0   cetirizine (ZYRTEC) 10 MG tablet TOME 1 PASTILLA POR VIA ORAL UNA VEZ AL DIA 30 tablet 11   glucose blood (TRUE METRIX BLOOD GLUCOSE TEST) test strip Use as instructed 100 each 12   atorvastatin (LIPITOR) 40 MG tablet TAKE 1 TABLET (40 MG TOTAL) BY MOUTH DAILY. 90 tablet 2   empagliflozin (JARDIANCE) 10 MG TABS tablet Take 1  tablet (10 mg total) by mouth daily before breakfast. 90 tablet 2   metFORMIN (GLUCOPHAGE) 1000 MG tablet Take 1 tablet (1,000 mg total) by mouth 2 (two) times daily with a meal. 180 tablet 2   No facility-administered medications prior to visit.    No Known Allergies     Objective:    BP 130/70 (BP Location: Left Arm, Patient Position: Sitting, Cuff Size: Normal)   Pulse 69   Ht 4\' 11"  (1.499 m)   Wt 151 lb 12.8 oz (68.9 kg)   LMP  (LMP Unknown)   BMI 30.66 kg/m  Wt Readings from Last 3 Encounters:  05/07/23 151 lb 12.8 oz (68.9 kg)  01/29/23 150 lb 9.6 oz (68.3 kg)  07/31/22 144 lb (65.3 kg)    Physical Exam Vitals and nursing note reviewed.  Constitutional:      Appearance: She is well-developed.  HENT:     Head: Normocephalic and atraumatic.  Cardiovascular:     Rate and Rhythm: Normal rate and regular rhythm.     Heart sounds: Normal heart sounds. No murmur heard.    No friction rub. No gallop.  Pulmonary:     Effort: Pulmonary effort is normal. No tachypnea or respiratory distress.     Breath sounds: Normal breath sounds. No decreased breath sounds, wheezing, rhonchi or rales.  Chest:     Chest wall: No tenderness.  Abdominal:     General: Bowel sounds are normal.     Palpations: Abdomen is soft.  Musculoskeletal:        General: Normal range of motion.     Cervical back: Normal range of motion.  Skin:    General: Skin is warm and dry.  Neurological:     Mental Status: She is alert and oriented to person, place, and time.     Coordination: Coordination normal.  Psychiatric:        Behavior: Behavior normal. Behavior is cooperative.        Thought Content: Thought content normal.        Judgment: Judgment normal.          Patient has been counseled extensively about nutrition and exercise as well as the importance of adherence with medications and regular follow-up. The patient was given clear instructions to go to ER or return to medical center if  symptoms don't improve, worsen or new problems develop. The patient verbalized understanding.   Follow-up: Return in about 3 months (around 08/07/2023).   Claiborne Rigg, FNP-BC [facility] [place], [place] [phone]   05/07/2023, 2:33 PM

## 2023-05-08 LAB — CMP14+EGFR
ALT: 22 IU/L (ref 0–32)
AST: 17 IU/L (ref 0–40)
Albumin: 4.6 g/dL (ref 3.9–4.9)
Alkaline Phosphatase: 101 IU/L (ref 44–121)
BUN/Creatinine Ratio: 28 — ABNORMAL HIGH (ref 9–23)
BUN: 14 mg/dL (ref 6–24)
Bilirubin Total: 0.9 mg/dL (ref 0.0–1.2)
CO2: 22 mmol/L (ref 20–29)
Calcium: 9.7 mg/dL (ref 8.7–10.2)
Chloride: 102 mmol/L (ref 96–106)
Creatinine, Ser: 0.5 mg/dL — ABNORMAL LOW (ref 0.57–1.00)
Globulin, Total: 3 g/dL (ref 1.5–4.5)
Glucose: 118 mg/dL — ABNORMAL HIGH (ref 70–99)
Potassium: 3.8 mmol/L (ref 3.5–5.2)
Sodium: 140 mmol/L (ref 134–144)
Total Protein: 7.6 g/dL (ref 6.0–8.5)
eGFR: 119 mL/min/{1.73_m2} (ref >59)

## 2023-05-08 LAB — VITAMIN D 25 HYDROXY (VIT D DEFICIENCY, FRACTURES): Vit D, 25-Hydroxy: 28.5 ng/mL — ABNORMAL LOW (ref 30.0–100.0)

## 2023-05-13 ENCOUNTER — Other Ambulatory Visit: Payer: Self-pay

## 2023-05-25 ENCOUNTER — Other Ambulatory Visit: Payer: Self-pay | Admitting: Nurse Practitioner

## 2023-05-25 DIAGNOSIS — E1165 Type 2 diabetes mellitus with hyperglycemia: Secondary | ICD-10-CM

## 2023-05-25 MED ORDER — EMPAGLIFLOZIN 25 MG PO TABS
25.0000 mg | ORAL_TABLET | Freq: Every day | ORAL | 1 refills | Status: AC
Start: 2023-05-25 — End: ?

## 2023-05-26 ENCOUNTER — Other Ambulatory Visit: Payer: Self-pay

## 2023-05-27 ENCOUNTER — Other Ambulatory Visit: Payer: Self-pay

## 2023-05-27 DIAGNOSIS — E1165 Type 2 diabetes mellitus with hyperglycemia: Secondary | ICD-10-CM

## 2023-05-27 MED ORDER — EMPAGLIFLOZIN 25 MG PO TABS
25.0000 mg | ORAL_TABLET | Freq: Every day | ORAL | 1 refills | Status: DC
Start: 2023-05-27 — End: 2023-06-25

## 2023-06-25 ENCOUNTER — Other Ambulatory Visit: Payer: Self-pay

## 2023-06-25 ENCOUNTER — Other Ambulatory Visit: Payer: Self-pay | Admitting: Pharmacist

## 2023-06-25 DIAGNOSIS — E1165 Type 2 diabetes mellitus with hyperglycemia: Secondary | ICD-10-CM

## 2023-06-25 MED ORDER — EMPAGLIFLOZIN 25 MG PO TABS
25.0000 mg | ORAL_TABLET | Freq: Every day | ORAL | 1 refills | Status: DC
Start: 2023-06-25 — End: 2024-03-28

## 2023-06-28 ENCOUNTER — Other Ambulatory Visit: Payer: Self-pay

## 2023-07-28 ENCOUNTER — Other Ambulatory Visit: Payer: Self-pay

## 2023-08-09 ENCOUNTER — Other Ambulatory Visit: Payer: Self-pay

## 2023-08-09 ENCOUNTER — Ambulatory Visit: Payer: Self-pay | Attending: Nurse Practitioner | Admitting: Nurse Practitioner

## 2023-08-09 ENCOUNTER — Encounter: Payer: Self-pay | Admitting: Nurse Practitioner

## 2023-08-09 VITALS — BP 113/75 | HR 77 | Ht 59.0 in | Wt 149.0 lb

## 2023-08-09 DIAGNOSIS — Z7984 Long term (current) use of oral hypoglycemic drugs: Secondary | ICD-10-CM

## 2023-08-09 DIAGNOSIS — E1165 Type 2 diabetes mellitus with hyperglycemia: Secondary | ICD-10-CM

## 2023-08-09 DIAGNOSIS — Z1231 Encounter for screening mammogram for malignant neoplasm of breast: Secondary | ICD-10-CM

## 2023-08-09 DIAGNOSIS — K089 Disorder of teeth and supporting structures, unspecified: Secondary | ICD-10-CM

## 2023-08-09 LAB — POCT GLYCOSYLATED HEMOGLOBIN (HGB A1C): Hemoglobin A1C: 7.5 % — AB (ref 4.0–5.6)

## 2023-08-09 MED ORDER — INFLUENZA VIRUS VACC SPLIT PF (FLUZONE) 0.5 ML IM SUSY
0.5000 mL | PREFILLED_SYRINGE | Freq: Once | INTRAMUSCULAR | 0 refills | Status: AC
  Filled 2023-08-09: qty 0.5, 1d supply, fill #0

## 2023-08-09 NOTE — Progress Notes (Signed)
Assessment & Plan:  Kimberly Wilkerson was seen today for medical management of chronic issues.  Diagnoses and all orders for this visit:  Type 2 diabetes mellitus with hyperglycemia, without long-term current use of insulin (HCC) -     POCT glycosylated hemoglobin (Hb A1C) -     Ambulatory referral to Ophthalmology  Breast cancer screening by mammogram -     MS 3D SCR MAMMO BILAT BR (aka MM); Future  Poor dentition -     Ambulatory referral to Dentistry    Patient has been counseled on age-appropriate routine health concerns for screening and prevention. These are reviewed and up-to-date. Referrals have been placed accordingly. Immunizations are up-to-date or declined.    Subjective:   Chief Complaint  Patient presents with   Medical Management of Chronic Issues    Kimberly Wilkerson 42 y.o. female presents to office today for follow up to DM  She has a past medical history of Fetal demise (09/11/2012), Gestational diabetes, Gonorrhea (2005), and DM2   VRI was used to communicate directly with patient for the entire encounter including providing detailed patient instructions.     DM 2 She has not been exercising. A1c is up from 7.8 to 8.1 today. She is currently taking jardiance 10 mg daily and metformin 1000 mg BID.  Her Jardiance increased at her last visit however she states she has only been receiving the 10 mg dose and was not aware she was supposed to take 25 mg daily since her last visit in August.  Lab Results  Component Value Date   HGBA1C 8.1 (A) 05/07/2023    Lab Results  Component Value Date   HGBA1C 8.1 (A) 05/07/2023     She states she has cataracts in both eyes and needs referrals to an eye specialist and a dentist.    Review of Systems  Constitutional:  Negative for fever, malaise/fatigue and weight loss.  HENT: Negative.  Negative for nosebleeds.   Eyes:  Positive for blurred vision. Negative for double vision and photophobia.  Respiratory: Negative.   Negative for cough and shortness of breath.   Cardiovascular: Negative.  Negative for chest pain, palpitations and leg swelling.  Gastrointestinal: Negative.  Negative for heartburn, nausea and vomiting.  Musculoskeletal: Negative.  Negative for myalgias.  Neurological: Negative.  Negative for dizziness, focal weakness, seizures and headaches.  Psychiatric/Behavioral: Negative.  Negative for suicidal ideas.     Past Medical History:  Diagnosis Date   Fetal demise 09/11/2012   Gestational diabetes    Gonorrhea 2005   Type 2 diabetes mellitus complicating pregnancy, antepartum 06/06/2012    Past Surgical History:  Procedure Laterality Date   NO PAST SURGERIES      Family History  Problem Relation Age of Onset   Diabetes Mellitus II Mother    Breast cancer Cousin     Social History Reviewed with no changes to be made today.   Outpatient Medications Prior to Visit  Medication Sig Dispense Refill   albuterol (VENTOLIN HFA) 108 (90 Base) MCG/ACT inhaler INHALE 2 PUFFS INTO THE LUNGS EVERY 6 (SIX) HOURS AS NEEDED FOR WHEEZING OR SHORTNESS OF BREATH. 6.7 g 1   atorvastatin (LIPITOR) 40 MG tablet Take 1 tablet (40 mg total) by mouth daily. 90 tablet 2   Blood Glucose Monitoring Suppl (TRUE METRIX METER) w/Device KIT Use as instructed. Check blood glucose level by fingerstick TWICE per day. 1 kit 0   empagliflozin (JARDIANCE) 25 MG TABS tablet Take 1 tablet (25 mg total)  by mouth daily before breakfast. 90 tablet 1   glucose blood (TRUE METRIX BLOOD GLUCOSE TEST) test strip Use as instructed 100 each 12   metFORMIN (GLUCOPHAGE) 1000 MG tablet Take 1 tablet (1,000 mg total) by mouth 2 (two) times daily with a meal. 180 tablet 2   cetirizine (ZYRTEC) 10 MG tablet TOME 1 PASTILLA POR VIA ORAL UNA VEZ AL DIA 30 tablet 11   No facility-administered medications prior to visit.    No Known Allergies     Objective:    BP 113/75 (BP Location: Left Arm, Patient Position: Sitting, Cuff Size:  Normal)   Pulse 77   Ht 4\' 11"  (1.499 m)   Wt 149 lb (67.6 kg)   LMP  (LMP Unknown)   SpO2 98%   BMI 30.09 kg/m  Wt Readings from Last 3 Encounters:  08/09/23 149 lb (67.6 kg)  05/07/23 151 lb 12.8 oz (68.9 kg)  01/29/23 150 lb 9.6 oz (68.3 kg)    Physical Exam Vitals and nursing note reviewed.  Constitutional:      Appearance: She is well-developed.  HENT:     Head: Normocephalic and atraumatic.  Cardiovascular:     Rate and Rhythm: Normal rate and regular rhythm.     Heart sounds: Normal heart sounds. No murmur heard.    No friction rub. No gallop.  Pulmonary:     Effort: Pulmonary effort is normal. No tachypnea or respiratory distress.     Breath sounds: Normal breath sounds. No decreased breath sounds, wheezing, rhonchi or rales.  Chest:     Chest wall: No tenderness.  Abdominal:     General: Bowel sounds are normal.     Palpations: Abdomen is soft.  Musculoskeletal:        General: Normal range of motion.     Cervical back: Normal range of motion.  Skin:    General: Skin is warm and dry.  Neurological:     Mental Status: She is alert and oriented to person, place, and time.     Coordination: Coordination normal.  Psychiatric:        Behavior: Behavior normal. Behavior is cooperative.        Thought Content: Thought content normal.        Judgment: Judgment normal.          Patient has been counseled extensively about nutrition and exercise as well as the importance of adherence with medications and regular follow-up. The patient was given clear instructions to go to ER or return to medical center if symptoms don't improve, worsen or new problems develop. The patient verbalized understanding.   Follow-up: Return in about 2 months (around 10/09/2023) for PAP SMEAR.   Claiborne Rigg, FNP-BC [facility] [place], [place] [phone]   08/09/2023, 2:05 PM

## 2023-08-09 NOTE — Patient Instructions (Addendum)
OPERATION SIGHT [email or web address]    MISSION CATARACT [place] [email or web address]

## 2023-08-12 ENCOUNTER — Other Ambulatory Visit: Payer: Self-pay

## 2023-08-17 ENCOUNTER — Other Ambulatory Visit: Payer: Self-pay | Admitting: Obstetrics and Gynecology

## 2023-08-17 DIAGNOSIS — Z1231 Encounter for screening mammogram for malignant neoplasm of breast: Secondary | ICD-10-CM

## 2023-08-25 ENCOUNTER — Other Ambulatory Visit: Payer: Self-pay

## 2023-08-27 ENCOUNTER — Other Ambulatory Visit: Payer: Self-pay

## 2023-08-27 ENCOUNTER — Other Ambulatory Visit (HOSPITAL_BASED_OUTPATIENT_CLINIC_OR_DEPARTMENT_OTHER): Payer: Self-pay

## 2023-09-29 ENCOUNTER — Other Ambulatory Visit: Payer: Self-pay

## 2023-10-11 ENCOUNTER — Other Ambulatory Visit (HOSPITAL_COMMUNITY)
Admission: RE | Admit: 2023-10-11 | Discharge: 2023-10-11 | Disposition: A | Discharge status: Home / Self Care | Payer: No Typology Code available for payment source | Source: Ambulatory Visit | Attending: Nurse Practitioner | Admitting: Nurse Practitioner

## 2023-10-11 ENCOUNTER — Encounter: Payer: Self-pay | Admitting: Nurse Practitioner

## 2023-10-11 ENCOUNTER — Ambulatory Visit: Payer: Self-pay | Attending: Nurse Practitioner | Admitting: Nurse Practitioner

## 2023-10-11 VITALS — BP 101/67 | HR 81 | Resp 20 | Ht 59.0 in | Wt 149.2 lb

## 2023-10-11 DIAGNOSIS — Z23 Encounter for immunization: Secondary | ICD-10-CM

## 2023-10-11 DIAGNOSIS — Z1272 Encounter for screening for malignant neoplasm of vagina: Secondary | ICD-10-CM | POA: Insufficient documentation

## 2023-10-11 NOTE — Progress Notes (Signed)
Assessment & Plan:  Kimberly Wilkerson was seen today for gynecologic exam.  Diagnoses and all orders for this visit:  Encounter for Papanicolaou smear of vagina Keep appt tomorrow for IUD removal -     Cytology - PAP -     Cervicovaginal ancillary only    Patient has been counseled on age-appropriate routine health concerns for screening and prevention. These are reviewed and up-to-date. Referrals have been placed accordingly. Immunizations are up-to-date or declined.    Subjective:   Chief Complaint  Patient presents with   Gynecologic Exam    Kimberly Wilkerson 43 y.o. female presents to office today for PAP smear. She [facility]. I have instructed her to keep her appt to have her IUD removed. PAP was completed today.   Review of Systems  Constitutional: Negative.  Negative for chills, fever, malaise/fatigue and weight loss.  Respiratory: Negative.  Negative for cough, shortness of breath and wheezing.   Cardiovascular: Negative.  Negative for chest pain, orthopnea and leg swelling.  Gastrointestinal:  Negative for abdominal pain.  Genitourinary: Negative.  Negative for flank pain.  Skin: Negative.  Negative for rash.  Psychiatric/Behavioral:  Negative for suicidal ideas.     Past Medical History:  Diagnosis Date   Fetal demise 09/11/2012   Gestational diabetes    Gonorrhea 2005   Type 2 diabetes mellitus complicating pregnancy, antepartum 06/06/2012    Past Surgical History:  Procedure Laterality Date   NO PAST SURGERIES      Family History  Problem Relation Age of Onset   Diabetes Mellitus II Mother    Breast cancer Cousin     Social History Reviewed with no changes to be made today.   Outpatient Medications Prior to Visit  Medication Sig Dispense Refill   albuterol (VENTOLIN HFA) 108 (90 Base) MCG/ACT inhaler INHALE 2 PUFFS INTO THE LUNGS EVERY 6 (SIX) HOURS AS NEEDED FOR WHEEZING OR SHORTNESS OF BREATH. 6.7 g 1   atorvastatin  (LIPITOR) 40 MG tablet Take 1 tablet (40 mg total) by mouth daily. 90 tablet 2   Blood Glucose Monitoring Suppl (TRUE METRIX METER) w/Device KIT Use as instructed. Check blood glucose level by fingerstick TWICE per day. 1 kit 0   cetirizine (ZYRTEC) 10 MG tablet TOME 1 PASTILLA POR VIA ORAL UNA VEZ AL DIA 30 tablet 11   empagliflozin (JARDIANCE) 25 MG TABS tablet Take 1 tablet (25 mg total) by mouth daily before breakfast. 90 tablet 1   glucose blood (TRUE METRIX BLOOD GLUCOSE TEST) test strip Use as instructed 100 each 12   metFORMIN (GLUCOPHAGE) 1000 MG tablet Take 1 tablet (1,000 mg total) by mouth 2 (two) times daily with a meal. 180 tablet 2   No facility-administered medications prior to visit.    No Known Allergies     Objective:    BP 101/67 (BP Location: Left Arm, Patient Position: Sitting, Cuff Size: Normal)   Pulse 81   Resp 20   Ht 4\' 11"  (1.499 m)   Wt 149 lb 3.2 oz (67.7 kg)   LMP  (LMP Unknown)   SpO2 99%   BMI 30.13 kg/m  Wt Readings from Last 3 Encounters:  10/11/23 149 lb 3.2 oz (67.7 kg)  08/09/23 149 lb (67.6 kg)  05/07/23 151 lb 12.8 oz (68.9 kg)    Physical Exam Exam conducted with a chaperone present.  Constitutional:      Appearance: She is well-developed.  HENT:     Head:  Normocephalic.  Cardiovascular:     Rate and Rhythm: Normal rate and regular rhythm.     Heart sounds: Normal heart sounds.  Pulmonary:     Effort: Pulmonary effort is normal.     Breath sounds: Normal breath sounds.  Abdominal:     General: Bowel sounds are normal.     Palpations: Abdomen is soft.     Hernia: There is no hernia in the left inguinal area.  Genitourinary:    Exam position: Lithotomy position.     Labia:        Right: No rash, tenderness, lesion or injury.        Left: No rash, tenderness, lesion or injury.      Vagina: Normal. No signs of injury and foreign body. No vaginal discharge, erythema, tenderness or bleeding.     Cervix: Normal.     Uterus:  Deviated (left). Not enlarged.      Adnexa:        Right: No mass, tenderness or fullness.         Left: No mass, tenderness or fullness.       Rectum: Normal. No external hemorrhoid.     Comments: IUD strings visible Lymphadenopathy:     Lower Body: No right inguinal adenopathy. No left inguinal adenopathy.  Skin:    General: Skin is warm and dry.  Neurological:     Mental Status: She is alert and oriented to person, place, and time.  Psychiatric:        Behavior: Behavior normal.        Thought Content: Thought content normal.        Judgment: Judgment normal.          Patient has been counseled extensively about nutrition and exercise as well as the importance of adherence with medications and regular follow-up. The patient was given clear instructions to go to ER or return to medical center if symptoms don't improve, worsen or new problems develop. The patient verbalized understanding.   Follow-up: Return in about 6 weeks (around 11/22/2023) for A1C.   Claiborne Rigg, FNP-BC [facility] and [facility] [place], [place] [phone]   10/11/2023, 2:08 PM

## 2023-10-12 ENCOUNTER — Ambulatory Visit
Admission: RE | Admit: 2023-10-12 | Discharge: 2023-10-12 | Disposition: A | Discharge status: Home / Self Care | Payer: No Typology Code available for payment source | Source: Ambulatory Visit | Attending: Obstetrics and Gynecology | Admitting: Obstetrics and Gynecology

## 2023-10-12 ENCOUNTER — Ambulatory Visit: Payer: Self-pay | Admitting: *Deleted

## 2023-10-12 VITALS — BP 116/87 | Wt 150.0 lb

## 2023-10-12 DIAGNOSIS — Z1231 Encounter for screening mammogram for malignant neoplasm of breast: Secondary | ICD-10-CM

## 2023-10-12 DIAGNOSIS — Z1239 Encounter for other screening for malignant neoplasm of breast: Secondary | ICD-10-CM

## 2023-10-12 LAB — CERVICOVAGINAL ANCILLARY ONLY
Bacterial Vaginitis (gardnerella): NEGATIVE
Candida Glabrata: NEGATIVE
Candida Vaginitis: NEGATIVE
Chlamydia: NEGATIVE
Comment: NEGATIVE
Comment: NEGATIVE
Comment: NEGATIVE
Comment: NEGATIVE
Comment: NEGATIVE
Comment: NORMAL
Neisseria Gonorrhea: NEGATIVE
Trichomonas: NEGATIVE

## 2023-10-12 NOTE — Patient Instructions (Signed)
Explained breast self awareness with Kimberly Wilkerson. Patient did not need a Pap smear today due to last Pap smear was 10/11/2023 per patient. Let her know [facility] will cover Pap smears every 3 years unless has a history of abnormal Pap smears. Referred patient to the [facility] for a screening mammogram on the mobile unit. Appointment scheduled Tuesday, October 12, 2023 at 1330. Patient aware of appointment and will be there. Let patient know the [facility] Center will follow up with her within the next couple weeks with results of mammogram by letter or phone. Kimberly Wilkerson verbalized understanding.  Giulian Goldring, Kathaleen Maser, RN 12:59 PM

## 2023-10-12 NOTE — Progress Notes (Signed)
Kimberly Wilkerson is a 43 y.o. female who presents to [facility] clinic today with no complaints.    Pap Smear: Pap smear not completed today. Last Pap smear was 10/11/2023 at [facility] and patient is awaiting results. Per patient has no history of an abnormal Pap smear. Last Pap smear result is not available in Epic. Previous Pap smear result is in EPIC.   Physical exam: Breasts Right breast slightly larger than left breast. No skin abnormalities bilateral breasts. No nipple retraction bilateral breasts. No nipple discharge bilateral breasts. No lymphadenopathy. No lumps palpated bilateral breasts. No complaints of pain or tenderness on exam.     MS DIGITAL SCREENING TOMO BILATERAL Result Date: 06/01/2022 CLINICAL DATA:  Screening. EXAM: DIGITAL SCREENING BILATERAL MAMMOGRAM WITH TOMOSYNTHESIS AND CAD TECHNIQUE: Bilateral screening digital craniocaudal and mediolateral oblique mammograms were obtained. Bilateral screening digital breast tomosynthesis was performed. The images were evaluated with computer-aided detection. COMPARISON:  Previous exam(s). ACR Breast Density Category b: There are scattered areas of fibroglandular density. FINDINGS: There are no findings suspicious for malignancy. IMPRESSION: No mammographic evidence of malignancy. A result letter of this screening mammogram will be mailed directly to the patient. RECOMMENDATION: Screening mammogram in one year. (Code:SM-B-01Y) BI-RADS CATEGORY  1: Negative. Electronically Signed   By: Romona Curls M.D.   On: 06/01/2022 08:37   MS DIGITAL SCREENING TOMO BILATERAL Result Date: 10/28/2020 CLINICAL DATA:  Screening. EXAM: DIGITAL SCREENING BILATERAL MAMMOGRAM WITH TOMOSYNTHESIS AND CAD TECHNIQUE: Bilateral screening digital craniocaudal and mediolateral oblique mammograms were obtained. Bilateral screening digital breast tomosynthesis was performed. The images were evaluated with computer-aided detection.  COMPARISON:  Previous exam(s). ACR Breast Density Category c: The breast tissue is heterogeneously dense, which may obscure small masses. FINDINGS: There are no findings suspicious for malignancy. IMPRESSION: No mammographic evidence of malignancy. A result letter of this screening mammogram will be mailed directly to the patient. RECOMMENDATION: Screening mammogram in one year. (Code:SM-B-01Y) BI-RADS CATEGORY  1: Negative. Electronically Signed   By: Amie Portland M.D.   On: 10/28/2020 07:41    Pelvic/Bimanual Pap is not indicated today per BCCCP guidelines.    Smoking History: Patient has never smoked.   Patient Navigation: Patient education provided. Access to services provided for patient through [place] program. Spanish interpreter Kimberly Wilkerson from [facility] provided.    Breast and Cervical Cancer Risk Assessment: Patient has family history of a second paternal cousin having breast cancer. Patient has no known genetic mutations or history of radiation treatment to the chest before age 6. Patient does not have history of cervical dysplasia, immunocompromised, or DES exposure in-utero.   Risk Scores as of Encounter on 10/12/2023     Kimberly Wilkerson           5-year 0.72%   Lifetime 10.44%            Last calculated by Kimberly Wilkerson, CMA on 10/12/2023 at 12:53 PM        A: BCCCP exam without pap smear No complaints.  P: [facility] to [facility] for a screening mammogram on the mobile unit. Appointment scheduled Tuesday, October 12, 2023 at 1330.   Kimberly Heidelberg, RN 10/12/2023 12:59 PM

## 2023-10-14 LAB — CYTOLOGY - PAP
Chlamydia: NEGATIVE
Comment: NEGATIVE
Comment: NEGATIVE
Comment: NORMAL
Diagnosis: NEGATIVE
High risk HPV: NEGATIVE
Neisseria Gonorrhea: NEGATIVE

## 2023-11-24 ENCOUNTER — Ambulatory Visit: Payer: Self-pay | Attending: Nurse Practitioner | Admitting: Nurse Practitioner

## 2023-11-24 ENCOUNTER — Encounter: Payer: Self-pay | Admitting: Nurse Practitioner

## 2023-11-24 ENCOUNTER — Other Ambulatory Visit: Payer: Self-pay

## 2023-11-24 VITALS — BP 120/70 | HR 76 | Ht 59.0 in | Wt 149.0 lb

## 2023-11-24 DIAGNOSIS — E1165 Type 2 diabetes mellitus with hyperglycemia: Secondary | ICD-10-CM

## 2023-11-24 DIAGNOSIS — Z794 Long term (current) use of insulin: Secondary | ICD-10-CM

## 2023-11-24 DIAGNOSIS — Z9109 Other allergy status, other than to drugs and biological substances: Secondary | ICD-10-CM

## 2023-11-24 LAB — POCT GLYCOSYLATED HEMOGLOBIN (HGB A1C): HbA1c, POC (controlled diabetic range): 8.3 % — AB (ref 0.0–7.0)

## 2023-11-24 MED ORDER — METFORMIN HCL 1000 MG PO TABS
1000.0000 mg | ORAL_TABLET | Freq: Two times a day (BID) | ORAL | 2 refills | Status: AC
  Filled 2023-11-24: qty 180, 90d supply, fill #0
  Filled 2024-05-23: qty 180, 90d supply, fill #1
  Filled 2024-10-26: qty 180, 90d supply, fill #2

## 2023-11-24 MED ORDER — CETIRIZINE HCL 10 MG PO TABS
10.0000 mg | ORAL_TABLET | Freq: Every day | ORAL | 3 refills | Status: AC
  Filled 2023-11-24: qty 90, 90d supply, fill #0

## 2023-11-24 NOTE — Progress Notes (Signed)
 Assessment & Plan:  [name] was seen today for diabetes.  Diagnoses and all orders for this visit:  Type 2 diabetes mellitus with hyperglycemia, without long-term current use of insulin  Declines starting any additional diabetes medications today.  -     POCT glycosylated hemoglobin (Hb A1C) -     metFORMIN (GLUCOPHAGE) 1000 MG tablet; Take 1 tablet (1,000 mg total) by mouth 2 (two) times daily with a meal. -     CMP14+EGFR Continue blood sugar control as discussed in office today, low carbohydrate diet, and regular physical exercise as tolerated, 150 minutes per week (30 min each day, 5 days per week, or 50 min 3 days per week). Keep blood sugar logs with fasting goal of 90-130 mg/dl, post prandial (after you eat) less than 180.  For Hypoglycemia: BS <60 and Hyperglycemia BS >400; contact the clinic ASAP. Annual eye exams and foot exams are recommended.   Environmental allergies -     cetirizine (ZYRTEC) 10 MG tablet; Take 1 tablet (10 mg total) by mouth daily. FOR ALLERGIES    Patient has been counseled on age-appropriate routine health concerns for screening and prevention. These are reviewed and up-to-date. Referrals have been placed accordingly. Immunizations are up-to-date or declined.    Subjective:   Chief Complaint  Patient presents with   Diabetes    Kimberly Wilkerson 43 y.o. female presents to office today for follow up to DM  VRI was used to communicate directly with patient for the entire encounter including providing detailed patient instructions.    DM 2 Only checking every now and then due to pain when she sticks her fingers.  A1c has increased from 7.5 to 8.3. She states she just started taking the Jardiance 25 mg daily 2 weeks ago. States she was finishing the Jardiance 10 mg prior to starting the 25 mg daily. Currently taking metformin 1000 mg BID. She is reluctant  to start any additional medications today and states she is going to start exercising again.  Weight is currently stable.  Lab Results  Component Value Date   HGBA1C 8.3 (A) 11/24/2023  Blood pressure is well controlled.   BP Readings from Last 3 Encounters:  11/24/23 120/70  10/12/23 116/87  10/11/23 101/67     Review of Systems  Constitutional:  Negative for fever, malaise/fatigue and weight loss.  HENT: Negative.  Negative for nosebleeds.   Eyes: Negative.  Negative for blurred vision, double vision and photophobia.  Respiratory: Negative.  Negative for cough and shortness of breath.   Cardiovascular: Negative.  Negative for chest pain, palpitations and leg swelling.  Gastrointestinal: Negative.  Negative for heartburn, nausea and vomiting.  Musculoskeletal: Negative.  Negative for myalgias.  Neurological: Negative.  Negative for dizziness, focal weakness, seizures and headaches.  Psychiatric/Behavioral: Negative.  Negative for suicidal ideas.     Past Medical History:  Diagnosis Date   Fetal demise 09/11/2012   Gestational diabetes    Gonorrhea 2005   Type 2 diabetes mellitus complicating pregnancy, antepartum 06/06/2012    Past Surgical History:  Procedure Laterality Date   NO PAST SURGERIES      Family History  Problem Relation Age of Onset   Diabetes Mellitus II Mother    Breast cancer Cousin     Social History Reviewed with no changes to be made today.   Outpatient Medications Prior to Visit  Medication Sig Dispense Refill   albuterol (VENTOLIN HFA) 108 (90 Base) MCG/ACT inhaler INHALE 2 PUFFS INTO THE LUNGS  EVERY 6 (SIX) HOURS AS NEEDED FOR WHEEZING OR SHORTNESS OF BREATH. 6.7 g 1   atorvastatin (LIPITOR) 40 MG tablet Take 1 tablet (40 mg total) by mouth daily. 90 tablet 2   Blood Glucose Monitoring Suppl (TRUE METRIX METER) w/Device KIT Use as instructed. Check blood glucose level by fingerstick TWICE per day. 1 kit 0   empagliflozin (JARDIANCE) 25 MG TABS tablet Take 1 tablet (25 mg total) by mouth daily before breakfast. 90 tablet 1   glucose blood  (TRUE METRIX BLOOD GLUCOSE TEST) test strip Use as instructed 100 each 12   metFORMIN (GLUCOPHAGE) 1000 MG tablet Take 1 tablet (1,000 mg total) by mouth 2 (two) times daily with a meal. 180 tablet 2   cetirizine (ZYRTEC) 10 MG tablet TOME 1 PASTILLA POR VIA ORAL UNA VEZ AL DIA 30 tablet 11   No facility-administered medications prior to visit.    No Known Allergies     Objective:    BP 120/70 (BP Location: Left Arm, Patient Position: Sitting, Cuff Size: Normal)   Pulse 76   Wt 149 lb (67.6 kg)   SpO2 96%   BMI 30.09 kg/m  Wt Readings from Last 3 Encounters:  11/24/23 149 lb (67.6 kg)  10/12/23 150 lb (68 kg)  10/11/23 149 lb 3.2 oz (67.7 kg)    Physical Exam Vitals and nursing note reviewed.  Constitutional:      Appearance: She is well-developed.  HENT:     Head: Normocephalic and atraumatic.  Cardiovascular:     Rate and Rhythm: Normal rate and regular rhythm.     Heart sounds: Normal heart sounds. No murmur heard.    No friction rub. No gallop.  Pulmonary:     Effort: Pulmonary effort is normal. No tachypnea or respiratory distress.     Breath sounds: Normal breath sounds. No decreased breath sounds, wheezing, rhonchi or rales.  Chest:     Chest wall: No tenderness.  Abdominal:     General: Bowel sounds are normal.     Palpations: Abdomen is soft.  Musculoskeletal:        General: Normal range of motion.     Cervical back: Normal range of motion.  Skin:    General: Skin is warm and dry.  Neurological:     Mental Status: She is alert and oriented to person, place, and time.     Coordination: Coordination normal.  Psychiatric:        Behavior: Behavior normal. Behavior is cooperative.        Thought Content: Thought content normal.        Judgment: Judgment normal.          Patient has been counseled extensively about nutrition and exercise as well as the importance of adherence with medications and regular follow-up. The patient was given clear  instructions to go to ER or return to medical center if symptoms don't improve, worsen or new problems develop. The patient verbalized understanding.   Follow-up: Return in about 3 months (around 02/24/2024).   Claiborne Rigg, FNP-BC [facility] [place], [place] [phone]   11/24/2023, 2:03 PM

## 2023-11-24 NOTE — Progress Notes (Signed)
 Kimberly Wilkerson # [id number]

## 2023-11-25 LAB — CMP14+EGFR
ALT: 23 IU/L (ref 0–32)
AST: 21 IU/L (ref 0–40)
Albumin: 4.4 g/dL (ref 3.9–4.9)
Alkaline Phosphatase: 116 IU/L (ref 44–121)
BUN/Creatinine Ratio: 22 (ref 9–23)
BUN: 11 mg/dL (ref 6–24)
Bilirubin Total: 0.8 mg/dL (ref 0.0–1.2)
CO2: 21 mmol/L (ref 20–29)
Calcium: 9.9 mg/dL (ref 8.7–10.2)
Chloride: 101 mmol/L (ref 96–106)
Creatinine, Ser: 0.5 mg/dL — ABNORMAL LOW (ref 0.57–1.00)
Globulin, Total: 3.5 g/dL (ref 1.5–4.5)
Glucose: 180 mg/dL — ABNORMAL HIGH (ref 70–99)
Potassium: 3.7 mmol/L (ref 3.5–5.2)
Sodium: 139 mmol/L (ref 134–144)
Total Protein: 7.9 g/dL (ref 6.0–8.5)
eGFR: 119 mL/min/{1.73_m2} (ref >59)

## 2023-11-30 ENCOUNTER — Other Ambulatory Visit: Payer: Self-pay | Admitting: Nurse Practitioner

## 2023-11-30 ENCOUNTER — Other Ambulatory Visit: Payer: Self-pay

## 2023-11-30 DIAGNOSIS — E1165 Type 2 diabetes mellitus with hyperglycemia: Secondary | ICD-10-CM

## 2023-11-30 MED ORDER — TRUE METRIX METER W/DEVICE KIT
PACK | 0 refills | Status: AC
Start: 2023-11-30 — End: ?
  Filled 2023-11-30: qty 1, 30d supply, fill #0

## 2023-12-02 ENCOUNTER — Other Ambulatory Visit: Payer: Self-pay

## 2023-12-24 ENCOUNTER — Other Ambulatory Visit: Payer: Self-pay

## 2023-12-31 ENCOUNTER — Other Ambulatory Visit: Payer: Self-pay

## 2024-02-25 ENCOUNTER — Ambulatory Visit: Admitting: Nurse Practitioner

## 2024-03-27 ENCOUNTER — Telehealth: Payer: Self-pay | Admitting: Nurse Practitioner

## 2024-03-27 NOTE — Telephone Encounter (Signed)
Contacted pt confirmed appt

## 2024-03-28 ENCOUNTER — Other Ambulatory Visit: Payer: Self-pay

## 2024-03-28 ENCOUNTER — Encounter: Payer: Self-pay | Admitting: Nurse Practitioner

## 2024-03-28 ENCOUNTER — Ambulatory Visit: Payer: Self-pay | Attending: Nurse Practitioner | Admitting: Nurse Practitioner

## 2024-03-28 VITALS — BP 100/67 | HR 81 | Resp 20 | Ht 59.0 in | Wt 145.2 lb

## 2024-03-28 DIAGNOSIS — Z30432 Encounter for removal of intrauterine contraceptive device: Secondary | ICD-10-CM

## 2024-03-28 DIAGNOSIS — E1165 Type 2 diabetes mellitus with hyperglycemia: Secondary | ICD-10-CM

## 2024-03-28 DIAGNOSIS — Z7984 Long term (current) use of oral hypoglycemic drugs: Secondary | ICD-10-CM

## 2024-03-28 LAB — POCT GLYCOSYLATED HEMOGLOBIN (HGB A1C): Hemoglobin A1C: 7.2 % — AB (ref 4.0–5.6)

## 2024-03-28 MED ORDER — EMPAGLIFLOZIN 25 MG PO TABS
25.0000 mg | ORAL_TABLET | Freq: Every day | ORAL | 1 refills | Status: DC
  Filled 2024-03-28: qty 90, 90d supply, fill #0

## 2024-03-28 NOTE — Progress Notes (Signed)
 Assessment and Plan Type 2 Diabetes Mellitus HbA1c improved from 8.3% to 7.2%. Target HbA1c <7%. Prefers natural management and exercise over medication. - Continue current diabetes management with emphasis on exercise and natural methods. - Monitor HbA1c regularly. - Refill Jardiance  prescription.  IUD Removal IUD in place for over ten years, exceeding recommended duration. Needs assistance finding a clinic for removal. - Refer to women's clinic for IUD removal. - Advise to contact clinic if no response within a week.  General Health Maintenance Blood pressure slightly low. Mammogram up to date. Colonoscopy needed after age 55. - Advise to drink at least four to five bottles of water daily. - Schedule colonoscopy after turning 43.   Patient has been counseled on age-appropriate routine health concerns for screening and prevention. These are reviewed and up-to-date. Referrals have been placed accordingly. Immunizations are up-to-date or declined.    Subjective:   Chief Complaint  Patient presents with   Diabetes   History of Present Illness Kimberly Wilkerson is a 43 year old female with diabetes who presents for follow-up of her A1c levels. She is accompanied by her son, Real, who assists with interpretation.  VRI was used to communicate directly with patient for the entire encounter including providing detailed patient instructions.    Her A1c has improved from 8.3 to 7.2, and she is working towards a goal of under 7. She is currently focusing on natural methods and increased exercise to manage her condition. She declines starting GLP1 Lab Results  Component Value Date   HGBA1C 7.2 (A) 03/28/2024     She has an IUD that has been in place for over ten years, exceeding the recommended duration. It was due for removal in January of last year. Requesting referral to GYN   Review of Systems  Constitutional:  Negative for fever, malaise/fatigue and weight loss.  HENT:  Negative.  Negative for nosebleeds.   Eyes: Negative.  Negative for blurred vision, double vision and photophobia.  Respiratory: Negative.  Negative for cough and shortness of breath.   Cardiovascular: Negative.  Negative for chest pain, palpitations and leg swelling.  Gastrointestinal: Negative.  Negative for heartburn, nausea and vomiting.  Musculoskeletal: Negative.  Negative for myalgias.  Neurological: Negative.  Negative for dizziness, focal weakness, seizures and headaches.  Psychiatric/Behavioral: Negative.  Negative for suicidal ideas.     Past Medical History:  Diagnosis Date   Fetal demise 09/11/2012   Gestational diabetes    Gonorrhea 2005   Type 2 diabetes mellitus complicating pregnancy, antepartum 06/06/2012    Past Surgical History:  Procedure Laterality Date   NO PAST SURGERIES      Family History  Problem Relation Age of Onset   Diabetes Mellitus II Mother    Breast cancer Cousin     Social History Reviewed with no changes to be made today.   Outpatient Medications Prior to Visit  Medication Sig Dispense Refill   atorvastatin  (LIPITOR) 40 MG tablet Take 1 tablet (40 mg total) by mouth daily. 90 tablet 2   Blood Glucose Monitoring Suppl (TRUE METRIX METER) w/Device KIT Use as instructed. Check blood glucose level by fingerstick TWICE per day. 1 kit 0   cetirizine  (ZYRTEC ) 10 MG tablet Take 1 tablet (10 mg total) by mouth daily. FOR ALLERGIES 90 tablet 3   empagliflozin  (JARDIANCE ) 25 MG TABS tablet Take 1 tablet (25 mg total) by mouth daily before breakfast. 90 tablet 1   glucose blood (TRUE METRIX BLOOD GLUCOSE TEST) test strip Use  as instructed 100 each 12   metFORMIN  (GLUCOPHAGE ) 1000 MG tablet Take 1 tablet (1,000 mg total) by mouth 2 (two) times daily with a meal. 180 tablet 2   albuterol  (VENTOLIN  HFA) 108 (90 Base) MCG/ACT inhaler INHALE 2 PUFFS INTO THE LUNGS EVERY 6 (SIX) HOURS AS NEEDED FOR WHEEZING OR SHORTNESS OF BREATH. (Patient not taking: Reported  on 03/28/2024) 6.7 g 1   No facility-administered medications prior to visit.    No Known Allergies     Objective:    BP 100/67 (BP Location: Left Arm, Patient Position: Sitting, Cuff Size: Normal)   Pulse 81   Resp 20   Ht 4' 11 (1.499 m)   Wt 145 lb 3.2 oz (65.9 kg)   LMP  (LMP Unknown)   SpO2 100%   BMI 29.33 kg/m  Wt Readings from Last 3 Encounters:  03/28/24 145 lb 3.2 oz (65.9 kg)  11/24/23 149 lb (67.6 kg)  10/12/23 150 lb (68 kg)    Physical Exam Vitals and nursing note reviewed.  Constitutional:      Appearance: She is well-developed.  HENT:     Head: Normocephalic and atraumatic.  Cardiovascular:     Rate and Rhythm: Normal rate and regular rhythm.     Heart sounds: Normal heart sounds. No murmur heard.    No friction rub. No gallop.  Pulmonary:     Effort: Pulmonary effort is normal. No tachypnea or respiratory distress.     Breath sounds: Normal breath sounds. No decreased breath sounds, wheezing, rhonchi or rales.  Chest:     Chest wall: No tenderness.  Abdominal:     General: Bowel sounds are normal.     Palpations: Abdomen is soft.  Musculoskeletal:        General: Normal range of motion.     Cervical back: Normal range of motion.  Skin:    General: Skin is warm and dry.  Neurological:     Mental Status: She is alert and oriented to person, place, and time.     Coordination: Coordination normal.  Psychiatric:        Behavior: Behavior normal. Behavior is cooperative.        Thought Content: Thought content normal.        Judgment: Judgment normal.          Patient has been counseled extensively about nutrition and exercise as well as the importance of adherence with medications and regular follow-up. The patient was given clear instructions to go to ER or return to medical center if symptoms don't improve, worsen or new problems develop. The patient verbalized understanding.  Discussed the use of AI scribe software for clinical note  transcription with the patient, who gave verbal consent to proceed.      Follow-up: No follow-ups on file.   Haze LELON Servant, FNP-BC [facility] [place], [place] [phone]   03/28/2024, 3:46 PM

## 2024-03-28 NOTE — Patient Instructions (Signed)
 RESUMEN DE LA VISITA:  Hoy revisamos su control de la diabetes y hablamos sobre la necesidad de Financial planner. Sus niveles de A1c han mejorado y niger es seguir mejorando. Tambin hablamos [employer] general de la salud, incluyendo la hidratacin y futuras pruebas de deteccin.  SU PLAN:  -DIABETES MELLITUS TIPO 2: La diabetes mellitus tipo 2 es una afeccin en la que el cuerpo no utiliza la insulina correctamente, lo que provoca niveles altos de Production assistant, radio. Su A1c ha mejorado del 8,3 % al 7,2 % y ecuador objetivo es reducirla por debajo del 7 %. Contine con mtodos naturales y ejercicio para controlar su diabetes. Monitorearemos su A1c regularmente y he renovado su receta de Jardiance .  -EXTRACCIN DEL DIU: Su DIU lleva colocado ms de diez aos, ms tiempo del recomendado. Necesita ser retirado. La derivar a una clnica de mujeres para que se lo extraigan. Por favor, pngase en contacto con la clnica si no recibe respuesta en una semana.  -MANTENIMIENTO GENERAL DE LA SALUD: Su presin arterial es ligeramente baja y su mamografa est al da. Necesitar una colonoscopia despus de cumplir 43 aos. Mientras tanto, asegrese de beber al menos de cuatro a cinco botellas de agua al da para mantenerse hidratada.  INSTRUCCIONES:  Contine con su plan actual de control de la diabetes y controle sus niveles de A1c regularmente. Comunquese con la clnica de la mujer para la extraccin del DIU y haga seguimiento si no recibe respuesta en una semana. Programe una colonoscopia despus de cumplir 43 aos. Beba al menos de cuatro a cinco botellas de agua al da para mantenerse hidratada.

## 2024-03-29 ENCOUNTER — Ambulatory Visit: Payer: Self-pay | Admitting: Nurse Practitioner

## 2024-03-30 LAB — CMP14+EGFR
ALT: 21 IU/L (ref 0–32)
AST: 21 IU/L (ref 0–40)
Albumin: 4.7 g/dL (ref 3.9–4.9)
Alkaline Phosphatase: 105 IU/L (ref 44–121)
BUN/Creatinine Ratio: 25 — ABNORMAL HIGH (ref 9–23)
BUN: 13 mg/dL (ref 6–24)
Bilirubin Total: 0.5 mg/dL (ref 0.0–1.2)
CO2: 20 mmol/L (ref 20–29)
Calcium: 9.9 mg/dL (ref 8.7–10.2)
Chloride: 100 mmol/L (ref 96–106)
Creatinine, Ser: 0.53 mg/dL — ABNORMAL LOW (ref 0.57–1.00)
Globulin, Total: 3.2 g/dL (ref 1.5–4.5)
Glucose: 100 mg/dL — ABNORMAL HIGH (ref 70–99)
Potassium: 4.2 mmol/L (ref 3.5–5.2)
Sodium: 137 mmol/L (ref 134–144)
Total Protein: 7.9 g/dL (ref 6.0–8.5)
eGFR: 117 mL/min/1.73 (ref >59)

## 2024-03-30 LAB — URINALYSIS, COMPLETE
Bilirubin, UA: NEGATIVE
Ketones, UA: NEGATIVE
Leukocytes,UA: NEGATIVE
Nitrite, UA: NEGATIVE
Protein,UA: NEGATIVE
RBC, UA: NEGATIVE
Specific Gravity, UA: 1.013 (ref 1.005–1.030)
Urobilinogen, Ur: 0.2 mg/dL (ref 0.2–1.0)
pH, UA: 6 (ref 5.0–7.5)

## 2024-03-30 LAB — MICROSCOPIC EXAMINATION
Bacteria, UA: NONE SEEN
Casts: NONE SEEN /LPF
Epithelial Cells (non renal): NONE SEEN /HPF (ref 0–10)
RBC, Urine: NONE SEEN /HPF (ref 0–2)
WBC, UA: NONE SEEN /HPF (ref 0–5)

## 2024-03-30 LAB — MICROALBUMIN / CREATININE URINE RATIO
Creatinine, Urine: 16.5 mg/dL
Microalb/Creat Ratio: <18 mg/g{creat} (ref 0–29)
Microalbumin, Urine: <3 ug/mL

## 2024-05-23 ENCOUNTER — Other Ambulatory Visit: Payer: Self-pay

## 2024-06-17 ENCOUNTER — Emergency Department (HOSPITAL_COMMUNITY)

## 2024-06-17 ENCOUNTER — Encounter (HOSPITAL_COMMUNITY): Payer: Self-pay | Admitting: *Deleted

## 2024-06-17 ENCOUNTER — Other Ambulatory Visit: Payer: Self-pay

## 2024-06-17 ENCOUNTER — Emergency Department (HOSPITAL_COMMUNITY)
Admission: EM | Admit: 2024-06-17 | Discharge: 2024-06-17 | Disposition: A | Discharge status: Home / Self Care | Attending: Emergency Medicine | Admitting: Emergency Medicine

## 2024-06-17 DIAGNOSIS — S2232XA Fracture of one rib, left side, initial encounter for closed fracture: Secondary | ICD-10-CM

## 2024-06-17 DIAGNOSIS — R079 Chest pain, unspecified: Secondary | ICD-10-CM | POA: Diagnosis not present

## 2024-06-17 DIAGNOSIS — E119 Type 2 diabetes mellitus without complications: Secondary | ICD-10-CM | POA: Diagnosis not present

## 2024-06-17 DIAGNOSIS — R1030 Lower abdominal pain, unspecified: Secondary | ICD-10-CM | POA: Insufficient documentation

## 2024-06-17 DIAGNOSIS — Z7984 Long term (current) use of oral hypoglycemic drugs: Secondary | ICD-10-CM | POA: Diagnosis not present

## 2024-06-17 DIAGNOSIS — Y92410 Unspecified street and highway as the place of occurrence of the external cause: Secondary | ICD-10-CM | POA: Diagnosis not present

## 2024-06-17 DIAGNOSIS — S1081XA Abrasion of other specified part of neck, initial encounter: Secondary | ICD-10-CM | POA: Diagnosis present

## 2024-06-17 LAB — CBC
HCT: 39 % (ref 36.0–46.0)
Hemoglobin: 13.4 g/dL (ref 12.0–15.0)
MCH: 30.5 pg (ref 26.0–34.0)
MCHC: 34.4 g/dL (ref 30.0–36.0)
MCV: 88.6 fL (ref 80.0–100.0)
Platelets: 308 K/uL (ref 150–400)
RBC: 4.4 MIL/uL (ref 3.87–5.11)
RDW: 12.4 % (ref 11.5–15.5)
WBC: 9.6 K/uL (ref 4.0–10.5)
nRBC: 0 % (ref 0.0–0.2)

## 2024-06-17 LAB — COMPREHENSIVE METABOLIC PANEL WITH GFR
ALT: 36 U/L (ref 0–44)
AST: 29 U/L (ref 15–41)
Albumin: 3.9 g/dL (ref 3.5–5.0)
Alkaline Phosphatase: 87 U/L (ref 38–126)
Anion gap: 10 (ref 5–15)
BUN: 11 mg/dL (ref 6–20)
CO2: 19 mmol/L — ABNORMAL LOW (ref 22–32)
Calcium: 8.9 mg/dL (ref 8.9–10.3)
Chloride: 106 mmol/L (ref 98–111)
Creatinine, Ser: 0.41 mg/dL — ABNORMAL LOW (ref 0.44–1.00)
GFR, Estimated: >60 mL/min (ref >60)
Glucose, Bld: 125 mg/dL — ABNORMAL HIGH (ref 70–99)
Potassium: 3.1 mmol/L — ABNORMAL LOW (ref 3.5–5.1)
Sodium: 135 mmol/L (ref 135–145)
Total Bilirubin: 1.1 mg/dL (ref 0.0–1.2)
Total Protein: 7.5 g/dL (ref 6.5–8.1)

## 2024-06-17 LAB — I-STAT CHEM 8, ED
BUN: 11 mg/dL (ref 6–20)
Calcium, Ion: 1.1 mmol/L — ABNORMAL LOW (ref 1.15–1.40)
Chloride: 106 mmol/L (ref 98–111)
Creatinine, Ser: 0.3 mg/dL — ABNORMAL LOW (ref 0.44–1.00)
Glucose, Bld: 128 mg/dL — ABNORMAL HIGH (ref 70–99)
HCT: 40 % (ref 36.0–46.0)
Hemoglobin: 13.6 g/dL (ref 12.0–15.0)
Potassium: 3.2 mmol/L — ABNORMAL LOW (ref 3.5–5.1)
Sodium: 138 mmol/L (ref 135–145)
TCO2: 20 mmol/L — ABNORMAL LOW (ref 22–32)

## 2024-06-17 LAB — I-STAT CG4 LACTIC ACID, ED: Lactic Acid, Venous: 1.2 mmol/L (ref 0.5–1.9)

## 2024-06-17 LAB — URINALYSIS, ROUTINE W REFLEX MICROSCOPIC
Bilirubin Urine: NEGATIVE
Glucose, UA: >=500 mg/dL — AB
Hgb urine dipstick: NEGATIVE
Ketones, ur: NEGATIVE mg/dL
Leukocytes,Ua: NEGATIVE
Nitrite: NEGATIVE
Protein, ur: NEGATIVE mg/dL
Specific Gravity, Urine: 1.009 (ref 1.005–1.030)
pH: 5 (ref 5.0–8.0)

## 2024-06-17 LAB — PROTIME-INR
INR: 1 (ref 0.8–1.2)
Prothrombin Time: 13.7 s (ref 11.4–15.2)

## 2024-06-17 LAB — HCG, SERUM, QUALITATIVE: Preg, Serum: NEGATIVE

## 2024-06-17 MED ORDER — METHOCARBAMOL 500 MG PO TABS: 500.0000 mg | ORAL_TABLET | Freq: Two times a day (BID) | ORAL | 0 refills | Status: DC

## 2024-06-17 MED ORDER — IOHEXOL 350 MG/ML SOLN
75.0000 mL | Freq: Once | INTRAVENOUS | Status: AC | PRN
  Administered 2024-06-17: 75 mL via INTRAVENOUS

## 2024-06-17 MED ORDER — LIDOCAINE 5 % EX PTCH: 1.0000 | MEDICATED_PATCH | CUTANEOUS | 0 refills | Status: AC

## 2024-06-17 MED ORDER — ONDANSETRON HCL 4 MG/2ML IJ SOLN
4.0000 mg | Freq: Once | INTRAMUSCULAR | Status: AC
  Administered 2024-06-17: 4 mg via INTRAVENOUS
  Filled 2024-06-17: qty 2

## 2024-06-17 MED ORDER — FENTANYL CITRATE PF 50 MCG/ML IJ SOSY
50.0000 ug | PREFILLED_SYRINGE | Freq: Once | INTRAMUSCULAR | Status: AC
  Administered 2024-06-17: 50 ug via INTRAVENOUS
  Filled 2024-06-17: qty 1

## 2024-06-17 MED ORDER — OXYCODONE HCL 5 MG PO TABS: 5.0000 mg | ORAL_TABLET | ORAL | 0 refills | Status: AC | PRN
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
## 2024-06-17 NOTE — ED Notes (Signed)
 Ambulated patient down the hallway and back from patients room to the doorway between blue and Blu/[facility]ang. Patient stated no SOB or dizziness. Patient stated she was having pain in the chest. No change to heart rate or o2 sat
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
## 2024-06-17 NOTE — ED Provider Notes (Signed)
 Signout received on this patient.  Pending CT scan at the end of shift.  See previous note for full details. Physical Exam  BP 118/84   Pulse 84   Temp 98.3 F (36.8 C)   Resp 18   Ht 4' 11 (1.499 m)   Wt 65.9 kg   SpO2 100%   BMI 29.34 kg/m     Procedures  Procedures  ED Course / MDM    Medical Decision Making Amount and/or Complexity of Data Reviewed Labs: ordered. Radiology: ordered.  Risk Prescription drug management.   CT scans resulted with potential left second rib fracture.  She does have point tenderness.  Likely fractured.  But nondisplaced.  Supportive care discussed.  Incentive spirometer ordered.  Pain control discussed and given.  Patient was ambulated in the department and did well.  She feels comfortable with discharge.  Discussed close follow-up with her PCP in the next 3 to 5 days. Discharged in stable condition.       Hildegard Loge, PA-C 06/17/24 1920    Cottie Donnice PARAS, MD 06/17/24 [id number]
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
## 2024-07-04 ENCOUNTER — Encounter: Payer: Self-pay | Admitting: Nurse Practitioner

## 2024-07-04 ENCOUNTER — Ambulatory Visit: Payer: Self-pay | Attending: Nurse Practitioner | Admitting: Nurse Practitioner

## 2024-07-04 ENCOUNTER — Other Ambulatory Visit: Payer: Self-pay

## 2024-07-04 VITALS — BP 111/76 | HR 77 | Resp 19 | Ht 59.0 in | Wt 148.6 lb

## 2024-07-04 DIAGNOSIS — E1165 Type 2 diabetes mellitus with hyperglycemia: Secondary | ICD-10-CM

## 2024-07-04 DIAGNOSIS — Z23 Encounter for immunization: Secondary | ICD-10-CM

## 2024-07-04 DIAGNOSIS — Z7984 Long term (current) use of oral hypoglycemic drugs: Secondary | ICD-10-CM

## 2024-07-04 DIAGNOSIS — R0782 Intercostal pain: Secondary | ICD-10-CM

## 2024-07-04 DIAGNOSIS — Z30432 Encounter for removal of intrauterine contraceptive device: Secondary | ICD-10-CM

## 2024-07-04 DIAGNOSIS — E7800 Pure hypercholesterolemia, unspecified: Secondary | ICD-10-CM

## 2024-07-04 DIAGNOSIS — Z975 Presence of (intrauterine) contraceptive device: Secondary | ICD-10-CM

## 2024-07-04 LAB — POCT GLYCOSYLATED HEMOGLOBIN (HGB A1C): Hemoglobin A1C: 7.8 % — AB (ref 4.0–5.6)

## 2024-07-04 MED ORDER — METHOCARBAMOL 500 MG PO TABS
500.0000 mg | ORAL_TABLET | Freq: Four times a day (QID) | ORAL | 0 refills | Status: AC | PRN
  Filled 2024-07-04: qty 40, 10d supply, fill #0

## 2024-07-04 MED ORDER — DAPAGLIFLOZIN PROPANEDIOL 10 MG PO TABS
10.0000 mg | ORAL_TABLET | Freq: Every day | ORAL | 3 refills | Status: AC
  Filled 2024-07-04: qty 90, 90d supply, fill #0
  Filled 2024-07-04: qty 30, 30d supply, fill #0
  Filled 2024-08-24: qty 30, 30d supply, fill #1

## 2024-07-04 MED ORDER — DICLOFENAC SODIUM 1 % EX GEL
4.0000 g | Freq: Four times a day (QID) | CUTANEOUS | 3 refills | Status: AC
  Filled 2024-07-04: qty 200, 12d supply, fill #0
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
## 2024-07-04 NOTE — Patient Instructions (Addendum)
 [facility] Address: [address], [place], [place] [address] Phone: [phone]
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
## 2024-07-05 DIAGNOSIS — Z23 Encounter for immunization: Secondary | ICD-10-CM

## 2024-07-05 LAB — LIPID PANEL
Chol/HDL Ratio: 6.2 ratio — ABNORMAL HIGH (ref 0.0–4.4)
Cholesterol, Total: 298 mg/dL — ABNORMAL HIGH (ref 100–199)
HDL: 48 mg/dL (ref >39)
LDL Chol Calc (NIH): 133 mg/dL — ABNORMAL HIGH (ref 0–99)
Triglycerides: 628 mg/dL (ref 0–149)
VLDL Cholesterol Cal: 117 mg/dL — ABNORMAL HIGH (ref 5–40)

## 2024-07-05 LAB — BASIC METABOLIC PANEL WITH GFR
BUN/Creatinine Ratio: 27 — ABNORMAL HIGH (ref 9–23)
BUN: 12 mg/dL (ref 6–24)
CO2: 20 mmol/L (ref 20–29)
Calcium: 9.7 mg/dL (ref 8.7–10.2)
Chloride: 99 mmol/L (ref 96–106)
Creatinine, Ser: 0.45 mg/dL — ABNORMAL LOW (ref 0.57–1.00)
Glucose: 99 mg/dL (ref 70–99)
Potassium: 4 mmol/L (ref 3.5–5.2)
Sodium: 136 mmol/L (ref 134–144)
eGFR: 122 mL/min/1.73 (ref >59)

## 2024-07-05 NOTE — Progress Notes (Signed)
 Completed.

## 2024-07-06 ENCOUNTER — Ambulatory Visit: Payer: Self-pay | Admitting: Nurse Practitioner

## 2024-07-06 ENCOUNTER — Other Ambulatory Visit: Payer: Self-pay

## 2024-07-06 DIAGNOSIS — E7800 Pure hypercholesterolemia, unspecified: Secondary | ICD-10-CM

## 2024-07-06 MED ORDER — ATORVASTATIN CALCIUM 80 MG PO TABS
80.0000 mg | ORAL_TABLET | Freq: Every day | ORAL | 3 refills | Status: AC
  Filled 2024-07-06: qty 30, 30d supply, fill #0

## 2024-07-10 ENCOUNTER — Other Ambulatory Visit: Payer: Self-pay

## 2024-07-20 ENCOUNTER — Other Ambulatory Visit: Payer: Self-pay

## 2024-08-15 ENCOUNTER — Ambulatory Visit: Payer: Self-pay

## 2024-08-24 ENCOUNTER — Other Ambulatory Visit: Payer: Self-pay

## 2024-09-04 ENCOUNTER — Other Ambulatory Visit: Payer: Self-pay

## 2024-09-06 ENCOUNTER — Other Ambulatory Visit: Payer: Self-pay

## 2024-09-13 ENCOUNTER — Ambulatory Visit: Payer: Self-pay

## 2024-09-29 ENCOUNTER — Telehealth: Payer: Self-pay

## 2024-09-29 NOTE — Telephone Encounter (Signed)
 Telephoned patient at mobile number using interpreter#[phone]. Left a voice message with [facility] (scholarship) contact information.

## 2024-10-10 ENCOUNTER — Ambulatory Visit: Payer: Self-pay | Admitting: Nurse Practitioner

## 2024-10-26 ENCOUNTER — Other Ambulatory Visit: Payer: Self-pay

## 2024-10-27 ENCOUNTER — Other Ambulatory Visit: Payer: Self-pay

## 2024-11-10 ENCOUNTER — Ambulatory Visit: Payer: Self-pay | Admitting: Nurse Practitioner
